# Patient Record
Sex: Female | Born: 1960
Health system: Southern US, Community
[De-identification: ages and names within clinical notes are randomized; demographics above are authoritative.]

## PROBLEM LIST (undated history)

## (undated) DIAGNOSIS — M1990 Unspecified osteoarthritis, unspecified site: Secondary | ICD-10-CM

## (undated) DIAGNOSIS — D649 Anemia, unspecified: Secondary | ICD-10-CM

## (undated) DIAGNOSIS — F32A Depression, unspecified: Secondary | ICD-10-CM

## (undated) DIAGNOSIS — K219 Gastro-esophageal reflux disease without esophagitis: Secondary | ICD-10-CM

## (undated) DIAGNOSIS — Z90710 Acquired absence of both cervix and uterus: Secondary | ICD-10-CM

## (undated) DIAGNOSIS — C539 Malignant neoplasm of cervix uteri, unspecified: Secondary | ICD-10-CM

## (undated) DIAGNOSIS — M810 Age-related osteoporosis without current pathological fracture: Secondary | ICD-10-CM

## (undated) DIAGNOSIS — G43909 Migraine, unspecified, not intractable, without status migrainosus: Secondary | ICD-10-CM

## (undated) DIAGNOSIS — J449 Chronic obstructive pulmonary disease, unspecified: Secondary | ICD-10-CM

## (undated) DIAGNOSIS — F419 Anxiety disorder, unspecified: Secondary | ICD-10-CM

## (undated) DIAGNOSIS — E7800 Pure hypercholesterolemia, unspecified: Secondary | ICD-10-CM

## (undated) DIAGNOSIS — E0590 Thyrotoxicosis, unspecified without thyrotoxic crisis or storm: Secondary | ICD-10-CM

## (undated) DIAGNOSIS — E2839 Other primary ovarian failure: Secondary | ICD-10-CM

## (undated) DIAGNOSIS — F329 Major depressive disorder, single episode, unspecified: Secondary | ICD-10-CM

## (undated) HISTORY — DX: Chronic obstructive pulmonary disease, unspecified: J44.9

## (undated) HISTORY — PX: TONSILLECTOMY: SUR1361

## (undated) HISTORY — DX: Anxiety disorder, unspecified: F41.9

## (undated) HISTORY — DX: Other primary ovarian failure: E28.39

## (undated) HISTORY — DX: Gastro-esophageal reflux disease without esophagitis: K21.9

## (undated) HISTORY — DX: Unspecified osteoarthritis, unspecified site: M19.90

## (undated) HISTORY — DX: Age-related osteoporosis without current pathological fracture: M81.0

## (undated) HISTORY — DX: Acquired absence of both cervix and uterus: Z90.710

## (undated) HISTORY — PX: BREAST SURGERY: SHX581

## (undated) HISTORY — PX: ABDOMINAL HYSTERECTOMY: SHX81

## (undated) HISTORY — DX: Migraine, unspecified, not intractable, without status migrainosus: G43.909

## (undated) HISTORY — DX: Pure hypercholesterolemia, unspecified: E78.00

## (undated) HISTORY — DX: Depression, unspecified: F32.A

## (undated) HISTORY — DX: Malignant neoplasm of cervix uteri, unspecified: C53.9

## (undated) HISTORY — DX: Thyrotoxicosis, unspecified without thyrotoxic crisis or storm: E05.90

---
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
## 2000-08-13 ENCOUNTER — Ambulatory Visit (HOSPITAL_COMMUNITY): Admission: EM | Admit: 2000-08-13 | Discharge: 2000-08-13 | Payer: Self-pay | Admitting: Emergency Medicine

## 2000-08-13 ENCOUNTER — Encounter: Payer: Self-pay | Admitting: Oral Surgery

## 2006-02-02 ENCOUNTER — Ambulatory Visit (HOSPITAL_COMMUNITY): Admission: RE | Admit: 2006-02-02 | Discharge: 2006-02-02 | Payer: Self-pay | Admitting: General Surgery

## 2006-08-17 ENCOUNTER — Ambulatory Visit (HOSPITAL_COMMUNITY): Admission: RE | Admit: 2006-08-17 | Discharge: 2006-08-17 | Payer: Self-pay | Admitting: General Surgery

## 2007-02-15 ENCOUNTER — Ambulatory Visit (HOSPITAL_COMMUNITY): Admission: RE | Admit: 2007-02-15 | Discharge: 2007-02-15 | Payer: Self-pay | Admitting: Family Medicine

## 2007-04-06 HISTORY — PX: COLONOSCOPY: SHX174

## 2017-01-27 ENCOUNTER — Ambulatory Visit (INDEPENDENT_AMBULATORY_CARE_PROVIDER_SITE_OTHER): Payer: No Typology Code available for payment source | Admitting: Obstetrics and Gynecology

## 2017-01-27 ENCOUNTER — Encounter: Payer: Self-pay | Admitting: Obstetrics and Gynecology

## 2017-01-27 VITALS — BP 110/80 | HR 84 | Ht 65.0 in | Wt 131.8 lb

## 2017-01-27 DIAGNOSIS — N930 Postcoital and contact bleeding: Secondary | ICD-10-CM

## 2017-01-27 DIAGNOSIS — Z01411 Encounter for gynecological examination (general) (routine) with abnormal findings: Secondary | ICD-10-CM | POA: Diagnosis not present

## 2017-01-27 DIAGNOSIS — N950 Postmenopausal bleeding: Secondary | ICD-10-CM

## 2017-01-27 MED ORDER — ESTRADIOL 1 MG PO TABS: 1.0000 mg | ORAL_TABLET | Freq: Every day | ORAL | 11 refills | Status: DC

## 2017-01-27 NOTE — Progress Notes (Signed)
Assessment:  Annual Gyn Exam  Postcoital bleeding status post hysterectomy Postmenopausal status Plan:  1. pap smear done, next pap due 3 years 2. Estrace Rx 3. return in 1 year for recheck of hormone therapy or prn 4    Annual mammogram advised Subjective:  Nicole Ewing is a 56 y.o. female No obstetric history on file. who presents for annual exam. No LMP recorded. The patient has complaints today of post-coital bleeding, dyspareunia, and stress incontinence. Pt notes that she has had a hysterectomy. Pt denies being on hormone therapy and notes that she no longer has menstrual cycles. Pt denies any other symptoms. Pt states that she has had fibrocystic tissue removed from right breast.  The following portions of the patient's history were reviewed and updated as appropriate: allergies, current medications, past family history, past medical history, past social history, past surgical history and problem list.  Family history notable for a husband's brother died today of Ewing Gary's disease, the patient's younger baby brother died of overdose 1 day after husband's sister died of a drug overdose, allegedly from same supplier Past Medical History:  Diagnosis Date  . Acid reflux   . Anxiety     Past Surgical History:  Procedure Laterality Date  . ABDOMINAL HYSTERECTOMY    . TONSILLECTOMY       Current Outpatient Prescriptions:  .  cholecalciferol (VITAMIN D) 400 units TABS tablet, Take 400 Units by mouth., Disp: , Rfl:  .  diazepam (VALIUM) 5 MG tablet, Take 5 mg by mouth every 6 (six) hours as needed for anxiety., Disp: , Rfl:  .  glucosamine-chondroitin 500-400 MG tablet, Take 1 tablet by mouth 3 (three) times daily., Disp: , Rfl:  .  omeprazole (PRILOSEC) 20 MG capsule, Take 20 mg by mouth daily., Disp: , Rfl:  .  vitamin B-12 (CYANOCOBALAMIN) 250 MCG tablet, Take 250 mcg by mouth daily., Disp: , Rfl:   Review of Systems Constitutional: negative Gastrointestinal:  negative Genitourinary: post-coital bleeding, dyspareunia, and stress incontinence  Objective:  BP 110/80   Pulse 84   Ht 5\' 5"  (1.651 m)   Wt 131 lb 12.8 oz (59.8 kg)   BMI 21.93 kg/m    BMI: Body mass index is 21.93 kg/m.  General Appearance: Alert, appropriate appearance for age. No acute distress HEENT: Grossly normal Neck / Thyroid:  Cardiovascular: RRR; normal S1, S2, no murmur Lungs: CTA bilaterally Back: No CVAT Breast Exam: No dimpling, nipple retraction or discharge. No masses or nodes., Normal to inspection and No masses or nodes.No dimpling, nipple retraction or discharge. Gastrointestinal: Soft, non-tender, no masses or organomegaly Pelvic Exam: Vulva and vagina appear normal. Bimanual exam reveals normal uterus and adnexa. Vaginal: normal without tenderness, induration or masses, normal rugae, atrophic mucosa and well supported Cervix: removed surgically Adnexa: normal bimanual exam Uterus: absent Rectal: good sphincter tone, no masses and guaiac negative Rectovaginal: not indicated Lymphatic Exam: Non-palpable nodes in neck, clavicular, axillary, or inguinal regions  Skin: no rash or abnormalities Neurologic: Normal gait and speech, no tremor  Psychiatric: Alert and oriented, appropriate affect.  Urinalysis:Not done  Nicole Ewing. MD Pgr [phone] 3:47 PM  By signing my name below, I, Nicole Ewing, attest that this documentation has been prepared under the direction and in the presence of Jonnie Kind, MD. Electronically Signed: Soijett Ewing, ED Scribe. 01/27/17. 3:47 PM.  I personally performed the services described in this documentation, which was SCRIBED in my presence. The recorded information has been reviewed and considered  accurate. It has been edited as necessary during review. Jonnie Kind, MD

## 2017-06-23 ENCOUNTER — Ambulatory Visit (INDEPENDENT_AMBULATORY_CARE_PROVIDER_SITE_OTHER): Admitting: Orthopaedic Surgery

## 2017-06-23 ENCOUNTER — Encounter (INDEPENDENT_AMBULATORY_CARE_PROVIDER_SITE_OTHER): Payer: Self-pay | Admitting: Orthopaedic Surgery

## 2017-06-23 VITALS — BP 137/90 | HR 98 | Ht 65.0 in | Wt 136.0 lb

## 2017-06-23 DIAGNOSIS — M25511 Pain in right shoulder: Secondary | ICD-10-CM

## 2017-06-23 DIAGNOSIS — M7541 Impingement syndrome of right shoulder: Secondary | ICD-10-CM | POA: Diagnosis not present

## 2017-06-23 NOTE — Progress Notes (Signed)
Office Visit Note/orthopedic consultation for on-the-job injury right shoulder   Patient: Nicole Ewing           Date of Birth: 08-16-1961           MRN: [id number] Visit Date: 06/23/2017              Requested by: Rory Percy, MD [place], [place] [address] PCP: Rory Percy, MD   Assessment & Plan: Visit Diagnoses:  1. Impingement syndrome of right shoulder     Plan: we will seek approval for right shoulder MRI scan to rule out partial or small full thickness tear as the result of her injury.  Plan to see her back after MRI of right shoulder no contrast needed.   Follow-Up Instructions: No Follow-up on file.   Orders:  Orders Placed This Encounter  Procedures  . MR SHOULDER RIGHT WO CONTRAST   No orders of the defined types were placed in this encounter.     Procedures: No procedures performed   Clinical Data: No additional findings.   Subjective: Chief Complaint  Patient presents with  . 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 She was seen at [facility] on the following day. She's been on home exercise program prednisone pack had a prednisone injection. She's had progressive decreased range of motion and pain difficult with outstretched reaching overhead activities. She denies numbness or tingling finger is or tingling in her fingers. She is right hand  Dominant, no past history of injured her shoulder she has no pain in her left shoulder. 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 Patient was not provided with a CA-17 form. Office notes from [place] were reviewed at this visit.She had an injection in her shoulder with continued symptoms.  Thank for the  opportunity to see her in consultation. I will keep you updated of her progress.  Review of Systems 14 point review systems performed positive for acid reflux mild anxiety history of migraines. She had previous hysterectomy tubal pregnancy and tonsillectomy when she was in fifth grade. 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   Objective: Vital Signs: BP 137/90   Pulse 98   Ht 5\' 5"  (1.651 m)   Wt 136 lb (61.7 kg)   BMI 22.63 kg/m   Physical Exam  Constitutional: She is oriented to person, place, and time. She appears well-developed.  HENT:  Head: Normocephalic.  Right Ear: External ear normal.  Left Ear: External ear normal.  Eyes: Pupils are equal, round, and reactive to light.  Neck: No tracheal deviation present. No thyromegaly present.  Cardiovascular: Normal rate.   Pulmonary/Chest: Effort normal.  Abdominal: Soft.  Musculoskeletal:  Good shoulder range of motion. Negative her meet negative Spurling no brachioplexus tenderness. Tenderness in the supraspinous fossa. Long head of the biceps moderately tender. Actively she can flex only to 80 abduct to 71. With isolated supraspinatus testing she has pain and some weakness. No weakness of external rotation. She has mild deltoid atrophy on the right shoulder which is her dominant shoulder compared to the left. Left shoulder shows full range of  motion negative impingement. Reflexes are normal normal heel toe gait no lower extremity clonus no hyperreflexia.  Neurological: She is alert and oriented to person, place, and time.  Skin: Skin is warm and dry.  Psychiatric: She has a normal mood and affect. Her behavior is normal.    Ortho Exam  Specialty Comments:  No specialty comments available.  Imaging: Outside images of her shoulder viewed this is negative for fracture. No significant AC joint degenerative changes. Glenohumeral joint is of well located and no high riding of the head.No  glenohumeral arthritis.  These images were from [facility]   PMFS History: There are no active problems to display for this patient.  Past Medical History:  Diagnosis Date  . Acid reflux   . Anxiety   . H/O: hysterectomy    Cervical dysplasia  . Menopause ovarian failure   . Migraines     Family History  Problem Relation Age of Onset  . Melanoma Father   . Graves' disease Child     Past Surgical History:  Procedure Laterality Date  . ABDOMINAL HYSTERECTOMY    . TONSILLECTOMY     Social History   Occupational History  . Not on file.   Social History Main Topics  . Smoking status: Former Research scientist (life sciences)  . Smokeless tobacco: Former Systems developer  . Alcohol use Yes     Comment: occasional  . Drug use: No  . Sexual activity: Not on file

## 2017-07-30 ENCOUNTER — Ambulatory Visit
Admission: RE | Admit: 2017-07-30 | Discharge: 2017-07-30 | Disposition: A | Discharge status: Home / Self Care | Payer: Worker's Compensation | Source: Ambulatory Visit | Attending: Orthopaedic Surgery | Admitting: Orthopaedic Surgery

## 2017-07-30 DIAGNOSIS — M7541 Impingement syndrome of right shoulder: Secondary | ICD-10-CM

## 2017-08-09 ENCOUNTER — Other Ambulatory Visit: Payer: Self-pay

## 2017-08-11 ENCOUNTER — Ambulatory Visit (INDEPENDENT_AMBULATORY_CARE_PROVIDER_SITE_OTHER): Admitting: Orthopaedic Surgery

## 2017-08-11 VITALS — Ht 65.0 in | Wt 136.0 lb

## 2017-08-11 DIAGNOSIS — M7541 Impingement syndrome of right shoulder: Secondary | ICD-10-CM | POA: Diagnosis not present

## 2017-08-11 MED ORDER — METHYLPREDNISOLONE ACETATE 40 MG/ML IJ SUSP
40.0000 mg | INTRAMUSCULAR | Status: AC | PRN
  Administered 2017-08-11: 40 mg via INTRA_ARTICULAR

## 2017-08-11 MED ORDER — BUPIVACAINE HCL 0.25 % IJ SOLN
4.0000 mL | INTRAMUSCULAR | Status: AC | PRN
  Administered 2017-08-11: 4 mL via INTRA_ARTICULAR

## 2017-08-11 MED ORDER — LIDOCAINE HCL 1 % IJ SOLN
1.0000 mL | INTRAMUSCULAR | Status: AC | PRN
  Administered 2017-08-11: 1 mL

## 2017-08-11 NOTE — Progress Notes (Signed)
Office Visit Note   Patient: Nicole Ewing           Date of Birth: 01-10-1961           MRN: [id number] Visit Date: 08/11/2017              Requested by: Rory Percy, MD [place], [place] [address] PCP: Rory Percy, MD   Assessment & Plan: Visit Diagnoses:  1. 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  Follow-Up Instructions: No Follow-up on file.   Orders:  No orders of the defined types were placed in this encounter.  No orders of the defined types were placed in this encounter.     Procedures: Large Joint Inj Date/Time: 08/11/2017 3:19 PM Performed by: Marybelle Killings Authorized by: Marybelle Killings   Consent Given by:  Patient Indications:  Pain Location:  Shoulder Site:  R subacromial bursa Needle Size:  22 G Needle Length:  1.5 inches Ultrasound Guidance: No   Fluoroscopic Guidance: No   Arthrogram: No   Medications:  1 mL lidocaine 1 %; 40 mg methylPREDNISolone acetate 40 MG/ML; 4 mL bupivacaine 0.25 % Aspiration Attempted: No   Patient tolerance:  Patient tolerated the procedure well with no immediate complications     Clinical Data: No additional findings.   Subjective: Chief Complaint  Patient presents with  . Right Shoulder - Pain, Follow-up    HPI 6 show female returns for runoff follow-up for continued problems with right shoulder after an on-the-job injury that occurred on 06/01/2017. She's been signed a rehabilitation nurse Elizebeth Brooking RN, CCM. Fax number is [phone]  Review of Systems 14 point  review of systems is updated from 06/23/2017 is unchanged from a last VISIT other than as mentioned in history of present illness.   Objective: Vital Signs: Ht 5\' 5"  (1.651 m)   Wt 136 lb (61.7 kg)   BMI 22.63 kg/m   Physical Exam  Constitutional: She is oriented to person,  place, and time. She appears well-developed.  HENT:  Head: Normocephalic.  Right Ear: External ear normal.  Left Ear: External ear normal.  Eyes: Pupils are equal, round, and reactive to light.  Neck: No tracheal deviation present. No thyromegaly present.  Cardiovascular: Normal rate.   Pulmonary/Chest: Effort normal.  Abdominal: Soft.  Neurological: She is alert and oriented to person, place, and time.  Skin: Skin is warm and dry.  Psychiatric: She has a normal mood and affect. Her behavior is normal.    Ortho Exam patient has mild brachial plexus tenderness on the right none on the left. Limitation of flexion she can flex about 100 which is 20 better than last office visit. She has some pain with resisted supraspinatus testing but has negative drop arm test. Acromioclavicular joint is minimally tender long head biceps tendon is normal well located and the groove negative Yergason test-320 extension M semi-reflexes are 2+ and symmetrical. Normal heel toe gait. Opposite shoulder shows full range of motion both shoulders are stable negative subluxation.  Specialty Comments:  No specialty comments available.  Imaging:  CLINICAL DATA:  Right shoulder pain and limited range of motion after reaching injury 06/01/2017. No previous relevant surgery.  EXAM: MRI OF THE RIGHT SHOULDER WITHOUT CONTRAST  TECHNIQUE: Multiplanar, multisequence MR imaging of the shoulder was performed. No  intravenous contrast was administered.  COMPARISON:  Radiographs 06/02/2017.  FINDINGS: Rotator cuff:  Intact without significant tendinosis.  Muscles:  No focal muscular atrophy or edema.  Biceps long head:  Intact and normally positioned.  Acromioclavicular Joint: The acromion is type 1. There are mild acromioclavicular degenerative changes. There is trace fluid in the subacromial -subdeltoid bursa.  Glenohumeral Joint: No significant shoulder joint effusion or glenohumeral  arthropathy.  Labrum: No evidence of labral tear. Probable congenital variant of the anterior superior labrum.  Bones: No acute or significant extra-articular osseous findings.  Other: No significant soft tissue findings.  IMPRESSION: 1. No acute findings or evidence of internal derangement. The rotator cuff appears normal. 2. Minimal subacromial -subdeltoid bursitis. 3. Mild acromioclavicular degenerative changes.   Electronically Signed   By: Richardean Sale M.D.   On: 07/31/2017 10:00  PMFS History: There are no active problems to display for this patient.  Past Medical History:  Diagnosis Date  . Acid reflux   . Anxiety   . H/O: hysterectomy    Cervical dysplasia  . Menopause ovarian failure   . Migraines     Family History  Problem Relation Age of Onset  . Melanoma Father   . Graves' disease Child     Past Surgical History:  Procedure Laterality Date  . ABDOMINAL HYSTERECTOMY    . TONSILLECTOMY     Social History   Occupational History  . Not on file.   Social History Main Topics  . Smoking status: Former Research scientist (life sciences)  . Smokeless tobacco: Former Systems developer  . Alcohol use Yes     Comment: occasional  . Drug use: No  . Sexual activity: Not on file

## 2017-09-22 ENCOUNTER — Ambulatory Visit (INDEPENDENT_AMBULATORY_CARE_PROVIDER_SITE_OTHER): Admitting: Orthopaedic Surgery

## 2017-09-22 ENCOUNTER — Encounter (INDEPENDENT_AMBULATORY_CARE_PROVIDER_SITE_OTHER): Payer: Self-pay | Admitting: Orthopaedic Surgery

## 2017-09-22 ENCOUNTER — Ambulatory Visit (INDEPENDENT_AMBULATORY_CARE_PROVIDER_SITE_OTHER)

## 2017-09-22 VITALS — BP 129/92 | HR 89 | Ht 65.0 in | Wt 136.0 lb

## 2017-09-22 DIAGNOSIS — M19011 Primary osteoarthritis, right shoulder: Secondary | ICD-10-CM | POA: Diagnosis not present

## 2017-09-22 DIAGNOSIS — M47812 Spondylosis without myelopathy or radiculopathy, cervical region: Secondary | ICD-10-CM | POA: Diagnosis not present

## 2017-09-22 NOTE — Progress Notes (Addendum)
Office Visit Note   Patient: Nicole Ewing           Date of Birth: [birth year]           MRN: [id number] Visit Date: 09/22/2017              Requested by: Rory Percy, MD [place], [place] [address] PCP: Rory Percy, MD   Assessment & Plan: Visit Diagnoses:  1. Arthritis of right acromioclavicular joint   2. Spondylosis without myelopathy or radiculopathy, cervical region     Plan: MRI scan is reviewed with patient as well as her report. She does have some acromioclavicular arthritis and we performed an injection today but she DID NOT get any relief and still has pain with shoulder range of motion in cervical range of motion.From on-the-job injury in June with the findings on her exam I would recommend proceeding with cervical MRI to rule out a disc herniation on the right side that may have  occurred at the time of her injury which is causing her persistent pain symptoms. Her plain radiographs do show spondylosis with disc space narrowing and spurring primarily at C5-6 and it may be that this is not an acute injury and may be related to pre-existing problems with MRI would determine that. Work slip given for continue modified work pending return and review of cervical MRI scan.  Follow-Up Instructions: No Follow-up on file.   Orders:  No orders of the defined types were placed in this encounter.  No orders of the defined types were placed in this encounter.     Procedures: No procedures performed   Clinical Data: No additional findings.   Subjective: Chief Complaint  Patient presents with  . Right Shoulder - Follow-up, Pain    HPI 56 yo female returns for on-the-job injury that occurred on 06/01/2017. She is here with the rehabilitation nurse Elizebeth Brooking RN, CCM fax number [phone]. Patient states she's been on modified duty with limited lifting. She continues to have pain in her shoulder she's had 2 subacromial injections one done by me. MRI scan showed  intact rotator cuff intact biceps tendon and some acromioclavicular degenerative changes. She states recently she started having more numbness radiates down her arm into her hand. Most of the pain is more proximal above the elbow most of the time. She denies any leg weakness no gait problems no staggering. Pain bothers her on a daily basis. She's had less problems doing the modified work but still has continued symptoms of bothers her at night. She's noticed pain with extension in her neck.She is concerned about the amount of pain that she's having and is concerned about being able to continue her normal job since she's been with [employer] for a approximally 17 years.  Review of Systems review of systems updated unchanged from 08/11/2017 office visit other than as mentioned above.   Objective: Vital Signs: BP (!) 129/92   Pulse 89   Ht 5\' 5"  (1.651 m)   Wt 136 lb (61.7 kg)   BMI 22.63 kg/m   Physical Exam  Constitutional: She is oriented to person, place, and time. She appears well-developed.  HENT:  Head: Normocephalic.  Right Ear: External ear normal.  Left Ear: External ear normal.  Eyes: Pupils are equal, round, and reactive to light.  Neck: No tracheal deviation present. No thyromegaly present.  Cardiovascular: Normal rate.   Pulmonary/Chest: Effort normal.  Abdominal: Soft.  Neurological: She is alert and  oriented to person, place, and time.  Skin: Skin is warm and dry.  Psychiatric: She has a normal mood and affect. Her behavior is normal.    Ortho Exam no supraclavicular lymphadenopathy pain with crossarm adduction test on the right negative on the left. She still has some pain with impingement negative drop arm test. Negative liftoff test no subluxation long head of the biceps is exquisitely tender she has a moderate brachial plexus tenderness positive Spurling on the left increased pain with cervical compression some relief with distraction. Upper extremity reflexes are 2+  biceps 2+ brachioradialis 1+ triceps. She has symmetrical triceps strength no SCU weakness.  Specialty Comments:  No specialty comments available.  Imaging: Study Result   CLINICAL DATA:  Right shoulder pain and limited range of motion after reaching injury 06/01/2017. No previous relevant surgery.  EXAM: MRI OF THE RIGHT SHOULDER WITHOUT CONTRAST  TECHNIQUE: Multiplanar, multisequence MR imaging of the shoulder was performed. No intravenous contrast was administered.  COMPARISON:  Radiographs 06/02/2017.  FINDINGS: Rotator cuff:  Intact without significant tendinosis.  Muscles:  No focal muscular atrophy or edema.  Biceps long head:  Intact and normally positioned.  Acromioclavicular Joint: The acromion is type 1. There are mild acromioclavicular degenerative changes. There is trace fluid in the subacromial -subdeltoid bursa.  Glenohumeral Joint: No significant shoulder joint effusion or glenohumeral arthropathy.  Labrum: No evidence of labral tear. Probable congenital variant of the anterior superior labrum.  Bones: No acute or significant extra-articular osseous findings.  Other: No significant soft tissue findings.  IMPRESSION: 1. No acute findings or evidence of internal derangement. The rotator cuff appears normal. 2. Minimal subacromial -subdeltoid bursitis. 3. Mild acromioclavicular degenerative changes.   Electronically Signed   By: Richardean Sale M.D.   On: 07/31/2017 10:00       PMFS History: There are no active problems to display for this patient.  Past Medical History:  Diagnosis Date  . Acid reflux   . Anxiety   . H/O: hysterectomy    Cervical dysplasia  . Menopause ovarian failure   . Migraines     Family History  Problem Relation Age of Onset  . Melanoma Father   . Graves' disease Child     Past Surgical History:  Procedure Laterality Date  . ABDOMINAL HYSTERECTOMY    . TONSILLECTOMY     Social History    Occupational History  . Not on file.   Social History Main Topics  . Smoking status: Former Research scientist (life sciences)  . Smokeless tobacco: Former Systems developer  . Alcohol use Yes     Comment: occasional  . Drug use: No  . Sexual activity: Not on file

## 2017-10-04 ENCOUNTER — Other Ambulatory Visit (INDEPENDENT_AMBULATORY_CARE_PROVIDER_SITE_OTHER): Payer: Self-pay | Admitting: Orthopaedic Surgery

## 2017-10-04 DIAGNOSIS — M4712 Other spondylosis with myelopathy, cervical region: Secondary | ICD-10-CM

## 2017-10-04 DIAGNOSIS — M4722 Other spondylosis with radiculopathy, cervical region: Principal | ICD-10-CM

## 2017-10-05 ENCOUNTER — Telehealth (INDEPENDENT_AMBULATORY_CARE_PROVIDER_SITE_OTHER): Payer: Self-pay | Admitting: Radiology

## 2017-10-05 NOTE — Telephone Encounter (Signed)
Patient left voicemail stating that [place] would not approve the MRI of her C-spine because there is nothing in the note that states she had shoulder pain that radiated into her neck. She states that this was discussed at the first visit.  She requests a call back.    Please advise.    [phone]

## 2017-10-06 NOTE — Telephone Encounter (Signed)
noted 

## 2017-10-06 NOTE — Telephone Encounter (Signed)
Once she has written denial she can appeal and get under PI. ROV after scan

## 2017-10-13 ENCOUNTER — Telehealth (INDEPENDENT_AMBULATORY_CARE_PROVIDER_SITE_OTHER): Payer: Self-pay | Admitting: Radiology

## 2017-10-13 NOTE — Telephone Encounter (Signed)
Do you have an updated status on MRI? It looked like you faxed more information on 10/11/2017 possibly?

## 2017-10-13 NOTE — Telephone Encounter (Signed)
Patient called Nicole Ewing office and left message to check the status of her MRI.

## 2017-10-13 NOTE — Telephone Encounter (Signed)
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 She says she has a paper that she received from [employer] and she will fax it to me for me to look at regarding her dx

## 2017-11-01 ENCOUNTER — Telehealth (INDEPENDENT_AMBULATORY_CARE_PROVIDER_SITE_OTHER): Payer: Self-pay | Admitting: Orthopaedic Surgery

## 2017-11-01 ENCOUNTER — Telehealth (INDEPENDENT_AMBULATORY_CARE_PROVIDER_SITE_OTHER): Payer: Self-pay

## 2017-11-01 NOTE — Telephone Encounter (Signed)
Patient returned call asked for a call back. The number to contact patient is [phone]

## 2017-11-01 NOTE — Telephone Encounter (Signed)
For followup on MRI, see other message, thanks

## 2017-11-01 NOTE — Telephone Encounter (Signed)
There was a voicemail on your phone from patient in reference to an MRI scan of her neck that she was supposed to be scheduled for. She said on VM that her insurance is needing more information from Dr Lorin Mercy to get scan approved. She asked for a call back to discuss. I tried calling her to further discuss, she did not answer. LM for her.

## 2017-11-01 NOTE — Telephone Encounter (Signed)
Nicole Ewing has been working on this and states claim accepted her claim for her shoulder and if its about her cervical spine then yes more information has been faxed to her employer per Nicole Ewing. She is still waiting on their approval

## 2017-11-01 NOTE — Telephone Encounter (Signed)
Sabrina can you follow up.

## 2017-11-07 ENCOUNTER — Telehealth (INDEPENDENT_AMBULATORY_CARE_PROVIDER_SITE_OTHER): Payer: Self-pay | Admitting: Orthopaedic Surgery

## 2017-11-07 NOTE — Telephone Encounter (Signed)
Send the last OV which has the needed info in the note  . You can read it to pt as well under PLAN . thanks

## 2017-11-07 NOTE — Telephone Encounter (Signed)
Nicole Ewing,  Patient called and stated that she needs to speak with you in regards to the W/C papers she gave to you.  Please call patient.

## 2017-11-07 NOTE — Telephone Encounter (Signed)
Patient has letter that was sent from Dept of Labor that needs to be filled out. She states that she has spoken with her union and she has not been denied for MRI at this point so she cannot appeal.  The Dept of Labor needs to know how the neck problem is related to her injury and why she needs the MRI of her cervical spine.  She states that they say they have not denied the MRI, just request more information.   Please advise on whether you have paperwork or what you would like for me to do. Thanks.

## 2017-11-08 NOTE — Telephone Encounter (Signed)
auth request is in process per dept of labor automated system

## 2017-11-08 NOTE — Telephone Encounter (Signed)
Can you send this to her WC person?

## 2017-11-18 ENCOUNTER — Telehealth (INDEPENDENT_AMBULATORY_CARE_PROVIDER_SITE_OTHER): Payer: Self-pay | Admitting: Radiology

## 2017-11-18 NOTE — Telephone Encounter (Signed)
Can you tell me if there has been any decision on approval for MRI by [employer]? Thanks.

## 2017-11-18 NOTE — Telephone Encounter (Signed)
Patient left voicemail asking if there has been any news on the scheduling of her MRI and if it has been approved by [employer].  She requests return call. [phone]

## 2017-11-23 ENCOUNTER — Telehealth (INDEPENDENT_AMBULATORY_CARE_PROVIDER_SITE_OTHER): Payer: Self-pay | Admitting: Orthopaedic Surgery

## 2017-11-23 NOTE — Telephone Encounter (Signed)
Patient returned call asked for a call back 

## 2017-11-23 NOTE — Telephone Encounter (Signed)
I left patient a voicemail. I have not heard anything further in regards to a denial or approval for MRI at this point. Office notes were sent to [facility] that go into detail in regards to workplace injury.

## 2017-11-24 NOTE — Telephone Encounter (Signed)
Patient came in for PT in [place] office. She has paperwork from the Department of Labor. She states that they will not accept office note and need documentation regarding why we need cervical MRI and how that relates to her injury. She states that the diagnosis for MRI should not match shoulder impingement. I told patient I would try and get with Amy Bishop tomorrow to see what we else we are able to do to get scan approved.

## 2017-11-30 ENCOUNTER — Telehealth (INDEPENDENT_AMBULATORY_CARE_PROVIDER_SITE_OTHER): Payer: Self-pay | Admitting: Orthopaedic Surgery

## 2017-11-30 NOTE — Telephone Encounter (Signed)
Patient called wanting to speak with you about possibly needing an MRI. CB # [phone]

## 2017-12-01 NOTE — Telephone Encounter (Signed)
Duplicate message in chart.  

## 2017-12-01 NOTE — Telephone Encounter (Signed)
I spoke with patient. She states that she spoke with the Dept of Labor.  They are saying that the office note will not work to get MRI approved. She needs, in a note, that the neck could be the source of her problems. The Dept of Labor needs a different code and needs to know how the shoulder and neck are related.   Can I send a note relaying all of the information in your plan from last visit, but make it separate from the office note?  They will not deny or approve without it. They also need cervical diagnosis code.   Please advise.

## 2017-12-04 NOTE — Telephone Encounter (Signed)
Ok proceed thanks  ,I  Called pt and discussed  Therapy making her worse so I told her to put therapy on hold pending MRI review thanks

## 2017-12-05 NOTE — Telephone Encounter (Signed)
Noted, MRI pending scheduling.

## 2018-01-12 ENCOUNTER — Telehealth (INDEPENDENT_AMBULATORY_CARE_PROVIDER_SITE_OTHER): Payer: Self-pay | Admitting: Radiology

## 2018-01-12 NOTE — Telephone Encounter (Signed)
Patient faxed over new CA-17 form that had to be completed. Form was completed with same modified duty pending MRI.  I called and spoke with the patient in depth in regards to form.  It is available to pick up in the [place] office.

## 2018-01-13 ENCOUNTER — Telehealth (INDEPENDENT_AMBULATORY_CARE_PROVIDER_SITE_OTHER): Payer: Self-pay

## 2018-01-13 NOTE — Telephone Encounter (Signed)
Received voice mail from pt (transferred the call to you as well) stating form that was completed yesterday in the [place] office did not have a date on it and she needs this to be done and faxed to [phone]

## 2018-01-16 ENCOUNTER — Ambulatory Visit (HOSPITAL_COMMUNITY)
Admission: RE | Admit: 2018-01-16 | Discharge: 2018-01-16 | Disposition: A | Discharge status: Home / Self Care | Payer: Self-pay | Source: Ambulatory Visit | Attending: Orthopaedic Surgery | Admitting: Orthopaedic Surgery

## 2018-01-16 DIAGNOSIS — M50221 Other cervical disc displacement at C4-C5 level: Secondary | ICD-10-CM | POA: Insufficient documentation

## 2018-01-16 DIAGNOSIS — M4712 Other spondylosis with myelopathy, cervical region: Secondary | ICD-10-CM

## 2018-01-16 DIAGNOSIS — M4722 Other spondylosis with radiculopathy, cervical region: Secondary | ICD-10-CM | POA: Insufficient documentation

## 2018-01-16 NOTE — Telephone Encounter (Signed)
I left voicemail for patient requesting she take form back by [facility] office and Baldo Ash will date and initial for her or if the company would like to fax to me in [place] office at [phone] I can do that and then fax back. Dr. Lorin Mercy is not in the office this week and had the copy of the completed form.

## 2018-01-26 ENCOUNTER — Ambulatory Visit (INDEPENDENT_AMBULATORY_CARE_PROVIDER_SITE_OTHER): Admitting: Orthopaedic Surgery

## 2018-01-26 ENCOUNTER — Encounter (INDEPENDENT_AMBULATORY_CARE_PROVIDER_SITE_OTHER): Payer: Self-pay | Admitting: Orthopaedic Surgery

## 2018-01-26 DIAGNOSIS — M25511 Pain in right shoulder: Secondary | ICD-10-CM | POA: Diagnosis not present

## 2018-01-26 DIAGNOSIS — M7541 Impingement syndrome of right shoulder: Secondary | ICD-10-CM | POA: Diagnosis not present

## 2018-01-26 MED ORDER — DULOXETINE HCL 20 MG PO CPEP
20.0000 mg | ORAL_CAPSULE | Freq: Every day | ORAL | 0 refills | Status: DC
Start: 2018-01-26 — End: 2019-06-05

## 2018-01-26 NOTE — Progress Notes (Signed)
Office Visit Note   Patient: Nicole Ewing           Date of Birth: 30-Dec-1960           MRN: [id number] Visit Date: 01/26/2018              Requested by: Rory Percy, MD [place], [place] [address] PCP: Rory Percy, MD   Assessment & Plan: Visit Diagnoses:  1. Right shoulder pain, unspecified chronicity     Plan: Reviewed the MRI scan with the patient.  She does not have any significant compression on the right side that would be consistent with her symptoms.  She is on modified work, I discussed with her at this point I do not think surgery is indicated for her shoulder or her cervical spine.  Prescription given for Cymbalta 20 mg p.o. daily to see if this helps some of her pain symptoms.  I plan to recheck her again in 3 weeks for final visit.  I do not think additional tests are indicated.  She could seek another opinion if she would like.  Follow-Up Instructions: No Follow-up on file.   Orders:  No orders of the defined types were placed in this encounter.  No orders of the defined types were placed in this encounter.     Procedures: No procedures performed   Clinical Data: No additional findings.   Subjective: Chief Complaint  Patient presents with  . Neck - Follow-up    MRI review  . Right Shoulder - Follow-up    HPI 57 year old female returns for follow-up for on-the-job injury working for the [place] [employer].  Injury date 06/01/2017 when she reached up to grab a box overhead and felt a sudden pop with sharp pain in her shoulder and decreased range of motion.  She is been through physical therapy, prednisone, medications, injections subacromial, acromioclavicular joint.  MRI scan of her shoulder demonstrated no significant pathology.  No rotator cuff muscle atrophy or injury.  She had some mild acromioclavicular degenerative changes.  Due to her persistent symptoms MRI scan of the cervical spine was obtained.  Available for review today.  Review of  Systems point review of systems is updated and is unchanged from 06/23/2017 office visit.   Objective: Vital Signs: There were no vitals taken for this visit.  Physical Exam  Constitutional: She is oriented to person, place, and time. She appears well-developed.  HENT:  Head: Normocephalic.  Right Ear: External ear normal.  Left Ear: External ear normal.  Eyes: Pupils are equal, round, and reactive to light.  Neck: No tracheal deviation present. No thyromegaly present.  Cardiovascular: Normal rate.  Pulmonary/Chest: Effort normal.  Abdominal: Soft.  Neurological: She is alert and oriented to person, place, and time.  Skin: Skin is warm and dry.  Psychiatric: She has a normal mood and affect. Her behavior is normal.    Ortho Exam patient has 80% cervical rotation.  She still has tenderness over her right shoulder with palpation reflexes are intact normal heel toe gait.  Negative or meet.  Negative Sp[place].  Specialty Comments:  No specialty comments available.  Imaging: CLINICAL DATA:  Neck pain radiating into the right arm for 6 months. No known injury.  EXAM: MRI CERVICAL SPINE WITHOUT CONTRAST  TECHNIQUE: Multiplanar, multisequence MR imaging of the cervical spine was performed. No intravenous contrast was administered.  COMPARISON:  None.  FINDINGS: Alignment: Maintained.  Vertebrae: No fracture or worrisome lesion. There is some degenerative  endplate signal change at C6-7.  Cord: Normal signal throughout.  Posterior Fossa, vertebral arteries, paraspinal tissues: Negative.  Disc levels:  C2-3: Minimal disc bulge endplate spur to the left. The central canal and foramina are open.  C3-4: Minimal disc bulge without stenosis.  C4-5: Shallow central protrusion and mild uncovertebral spurring on the right without stenosis.  C5-6: There is some loss of disc space height. Mild posterior bony ridging is present. There is uncovertebral disease, much  worse on the left. Moderate to moderately severe left foraminal narrowing is identified. The central canal and right foramen are open.  C6-7: There is loss of disc space height. Mild posterior bony ridging and uncovertebral disease are seen. The central canal is open. Mild bilateral foraminal narrowing is more notable on the left.  C7-T1: Negative.  IMPRESSION: Uncovertebral spurring on the left at C5-6 causes moderate to moderately severe left foraminal narrowing. The central canal and right foramen are open.  Uncovertebral spurring at C6-7 causes mild bilateral foraminal narrowing, worse on the left.  Small central protrusion and mild uncovertebral disease on the right at C4-5 without stenosis.   Electronically Signed   By: Inge Rise M.D.   On: 01/16/2018 14:32   PMFS History: There are no active problems to display for this patient.  Past Medical History:  Diagnosis Date  . Acid reflux   . Anxiety   . H/O: hysterectomy    Cervical dysplasia  . Menopause ovarian failure   . Migraines     Family History  Problem Relation Age of Onset  . Melanoma Father   . Graves' disease Child     Past Surgical History:  Procedure Laterality Date  . ABDOMINAL HYSTERECTOMY    . TONSILLECTOMY     Social History   Occupational History  . Not on file  Tobacco Use  . Smoking status: Former Research scientist (life sciences)  . Smokeless tobacco: Former Network engineer and Sexual Activity  . Alcohol use: Yes    Comment: occasional  . Drug use: No  . Sexual activity: Not on file

## 2018-01-31 ENCOUNTER — Telehealth (INDEPENDENT_AMBULATORY_CARE_PROVIDER_SITE_OTHER): Payer: Self-pay | Admitting: Orthopaedic Surgery

## 2018-01-31 ENCOUNTER — Encounter (INDEPENDENT_AMBULATORY_CARE_PROVIDER_SITE_OTHER): Payer: Self-pay | Admitting: Orthopaedic Surgery

## 2018-01-31 ENCOUNTER — Telehealth (INDEPENDENT_AMBULATORY_CARE_PROVIDER_SITE_OTHER): Payer: Self-pay | Admitting: *Deleted

## 2018-01-31 NOTE — Telephone Encounter (Signed)
I called and spoke with patient. 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 She would like to come in and show you paperwork before she starts this route (per union recommendations.)  Appt made for tomorrow morning.

## 2018-01-31 NOTE — Telephone Encounter (Signed)
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  Please fax note to Alease Frame, pt says you should have fax number already. Pt is also requesting 2 copies one for her records.

## 2018-01-31 NOTE — Telephone Encounter (Signed)
OK to cover her for note for 3 wks modified from last ov when we started cymbalta.let her know  After that she will have to go back to regular work, get another opinion or get another job. Discussed with her at last ov. thanks

## 2018-01-31 NOTE — Telephone Encounter (Signed)
Please advise on note?  

## 2018-01-31 NOTE — Telephone Encounter (Signed)
Another note in chart.

## 2018-01-31 NOTE — Telephone Encounter (Signed)
Patient called asking for a call back regarding a position she has been offered at her job and the restrictions that Dr. Lorin Mercy has on her work duty. CB # [phone]

## 2018-02-01 ENCOUNTER — Ambulatory Visit (INDEPENDENT_AMBULATORY_CARE_PROVIDER_SITE_OTHER): Admitting: Orthopaedic Surgery

## 2018-02-01 ENCOUNTER — Telehealth: Payer: Self-pay | Admitting: Radiology

## 2018-02-01 ENCOUNTER — Encounter (INDEPENDENT_AMBULATORY_CARE_PROVIDER_SITE_OTHER): Payer: Self-pay | Admitting: Orthopaedic Surgery

## 2018-02-01 VITALS — BP 125/83 | HR 68 | Ht 65.0 in | Wt 136.0 lb

## 2018-02-01 DIAGNOSIS — M25511 Pain in right shoulder: Secondary | ICD-10-CM | POA: Diagnosis not present

## 2018-02-01 DIAGNOSIS — M7541 Impingement syndrome of right shoulder: Secondary | ICD-10-CM

## 2018-02-01 NOTE — Telephone Encounter (Signed)
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 She remains on modified duty as before until I see her back. thanks

## 2018-02-01 NOTE — Telephone Encounter (Signed)
Patient called back. She states that she has to have note that says she cannot reach out to put mail in [place]. Her supervisor, Awanda Mink, states the note has to say this or she will go back on the route. Patient needs this note as soon as possible. Please fax note to Attn: Alease Frame to number previously listed in chart.  OK to add to modified duty note? Please advise.

## 2018-02-01 NOTE — Progress Notes (Addendum)
Office Visit Note   Patient: Nicole Ewing           Date of Birth: 08-25-1961           MRN: [id number] Visit Date: 02/01/2018              Requested by: Rory Percy, MD [place], [place] [address] PCP: Rory Percy, MD   Assessment & Plan: Visit Diagnoses:  1. Right shoulder pain, unspecified chronicity     Plan: I started the patient on Cymbalta with a 3-week trial to see if this gave her enough improvement that she can get back to doing her regular job.  She has not been on medication long enough and work slip given for continued modified work for 3 weeks from the 01/26/2018 date when I last saw her and filled out her CA-17 form.  Follow-Up Instructions: Follow-up as scheduled.  Orders:  No orders of the defined types were placed in this encounter.  No orders of the defined types were placed in this encounter.     Procedures: No procedures performed   Clinical Data: No additional findings.   Subjective: Chief Complaint  Patient presents with  . Right Shoulder - Pain  . Neck - Pain    HPI patient returns and brought with her a sheet after I had seen her last on 01/26/2018 and gave her a note that said continue modified work  written on her CA -17 form.  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  Review of Systems Updated and unchanged from last office visit.  Objective: Vital Signs: BP 125/83   Pulse 68   Ht 5\' 5"  (1.651 m)   Wt 136 lb (61.7 kg)   BMI 22.63 kg/m   Physical Exam  Constitutional: She is oriented to person, place, and time. She appears well-developed.  HENT:  Head: Normocephalic.  Right Ear: External ear normal.  Left Ear: External ear normal.  Eyes: Pupils are equal, round, and reactive to light.  Neck: No tracheal deviation present. No thyromegaly present.  Cardiovascular: Normal rate.  Pulmonary/Chest: Effort normal.  Abdominal: Soft.    Neurological: She is alert and oriented to person, place, and time.  Skin: Skin is warm and dry.  Psychiatric: She has a normal mood and affect. Her behavior is normal.    Ortho Exam patient is able to reach with her arm up overhead.  She has discomfort with outstretched reaching.  She has 80% cervical rotation.  Still has tenderness over his right shoulder with palpation.  No swelling in the hand.  Normal heel toe gait.   Specialty Comments:  No specialty comments available.  Imaging: No results found.   PMFS History: There are no active problems to display for this patient.  Past Medical History:  Diagnosis Date  . Acid reflux   . Anxiety   . H/O: hysterectomy    Cervical dysplasia  . Menopause ovarian failure   . Migraines     Family History  Problem Relation Age of Onset  . Melanoma Father   . Graves' disease Child     Past Surgical History:  Procedure Laterality Date  . ABDOMINAL HYSTERECTOMY    . TONSILLECTOMY     Social History   Occupational History  . Not on file  Tobacco Use  . Smoking status: Former Research scientist (life sciences)  . Smokeless tobacco: Former Systems developer  Substance and Sexual Activity  . Alcohol use: Yes    Comment: occasional  . Drug use: No  . Sexual activity: Not on file

## 2018-02-02 NOTE — Telephone Encounter (Signed)
Note completed and faxed

## 2018-02-02 NOTE — Telephone Encounter (Signed)
I called patient and advised. 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 She was sent home because at this time, they do not have modified duty. I placed note at front desk in [place] office for patient to pick up.

## 2018-02-16 ENCOUNTER — Ambulatory Visit (INDEPENDENT_AMBULATORY_CARE_PROVIDER_SITE_OTHER): Admitting: Orthopaedic Surgery

## 2018-02-16 ENCOUNTER — Encounter (INDEPENDENT_AMBULATORY_CARE_PROVIDER_SITE_OTHER): Payer: Self-pay | Admitting: Orthopaedic Surgery

## 2018-02-16 VITALS — Ht 65.0 in | Wt 136.0 lb

## 2018-02-16 DIAGNOSIS — M25511 Pain in right shoulder: Secondary | ICD-10-CM | POA: Diagnosis not present

## 2018-02-16 DIAGNOSIS — M7541 Impingement syndrome of right shoulder: Secondary | ICD-10-CM

## 2018-02-16 NOTE — Progress Notes (Signed)
Office Visit Note   Patient: Nicole Ewing           Date of Birth: [birth year]           MRN: [id number] Visit Date: 02/16/2018              Requested by: Rory Percy, MD [place], [place] [address] PCP: Rory Percy, MD   Assessment & Plan: Visit Diagnoses:  1. Right shoulder pain, unspecified chronicity     Plan: She has had appropriate diagnostic workup.  Unfortunately she continues to have symptoms.  I discussed with her that I would recommend stopping the Cymbalta since it only made her sleepy really did not help her pain.  She has been through appropriate conservative treatment had diagnostic injections appropriate diagnostic imaging.  Her options are resume regular work or find some other job someplace else.  Based on findings of her scan no impairment is assigned.  She can return on a as needed basis.  Follow-Up Instructions: No Follow-up on file.   Orders:  No orders of the defined types were placed in this encounter.  No orders of the defined types were placed in this encounter.     Procedures: No procedures performed   Clinical Data: No additional findings.   Subjective: Chief Complaint  Patient presents with  . Neck - Pain, Follow-up  . Right Shoulder - Pain, Follow-up    HPI patient returns states she still having ongoing problems with her shoulder she states she is not able to lift up her grandson.  She has to sit down and have her grandson crawl into her lap.  I discussed with received some paperwork from [employer] mailed in from out of town concerning a modified duty slip that she had signed this was faxed to me as a copy.  Numerous questions were listed.  As I stated in my last note on 02/01/2018 the plan was Cymbalta for 3 weeks and if this was not effective then it would be stopped.  She is been worked up with diagnostic test for both her shoulder and also her neck.  Directions, therapy, anti-inflammatories, muscle relaxants have not been  effective.  Diagnostic studies have failed to find the cause of her continued pain in her shoulder.  Discussed with her at this point options are going to get another opinion, resume regular work, or get a different job altogether with another employer.  Patient does have some mild spurring in her neck but is on the left side opposite for where she has symptoms which is her right arm. MRI scan of her shoulder showed minimal subacromial subdeltoid bursitis and she had had a previous injection and some of this may be left over artifact.  She had mild acromioclavicular degenerative changes which did not respond to injection treatment.  Rotator cuff was intact biceps tendon was intact labrum was intact. Review of Systems unchanged from last office visit 14 point review of systems updated.   Objective: Vital Signs: Ht 5\' 5"  (1.651 m)   Wt 136 lb (61.7 kg)   BMI 22.63 kg/m   Physical Exam  Constitutional: She is oriented to person, place, and time. She appears well-developed.  HENT:  Head: Normocephalic.  Right Ear: External ear normal.  Left Ear: External ear normal.  Eyes: Pupils are equal, round, and reactive to light.  Neck: No tracheal deviation present. No thyromegaly present.  Cardiovascular: Normal rate.  Pulmonary/Chest: Effort normal.  Abdominal: Soft.  Neurological:  She is alert and oriented to person, place, and time.  Skin: Skin is warm and dry.  Psychiatric: She has a normal mood and affect. Her behavior is normal.    Ortho Exam patient has some left trochanteric tenderness mild left sciatic notch tenderness negative straight leg raising to 90 degrees.  He can get her arm up overhead not described as rapidly on the right shoulder as the left.  No limitation of range of motion negative drop arm test.  Specialty Comments:  No specialty comments available.  Imaging: No results found.   PMFS History: There are no active problems to display for this patient.  Past Medical  History:  Diagnosis Date  . Acid reflux   . Anxiety   . H/O: hysterectomy    Cervical dysplasia  . Menopause ovarian failure   . Migraines     Family History  Problem Relation Age of Onset  . Melanoma Father   . Graves' disease Child     Past Surgical History:  Procedure Laterality Date  . ABDOMINAL HYSTERECTOMY    . TONSILLECTOMY     Social History   Occupational History  . Not on file  Tobacco Use  . Smoking status: Former Research scientist (life sciences)  . Smokeless tobacco: Former Network engineer and Sexual Activity  . Alcohol use: Yes    Comment: occasional  . Drug use: No  . Sexual activity: Not on file

## 2018-02-20 ENCOUNTER — Telehealth (INDEPENDENT_AMBULATORY_CARE_PROVIDER_SITE_OTHER): Payer: Self-pay | Admitting: Orthopaedic Surgery

## 2018-02-20 NOTE — Telephone Encounter (Signed)
Please advise. On 2nd opinion

## 2018-02-20 NOTE — Telephone Encounter (Signed)
Patient called to state that Dr Lorin Mercy advised her to get a 2nd opinion and says she needs a Referral from him.  Please call patient to advise

## 2018-02-20 NOTE — Telephone Encounter (Signed)
Dr. Lorin Mercy said [facility] allows a second opinion, she can talk with them about who she would like to see and we can send records. [facility] has to approve her choice.

## 2018-02-28 ENCOUNTER — Telehealth (INDEPENDENT_AMBULATORY_CARE_PROVIDER_SITE_OTHER): Payer: Self-pay

## 2018-02-28 NOTE — Telephone Encounter (Signed)
Faxed the 02/20/18 office note to case mgr per his request

## 2018-02-28 NOTE — Telephone Encounter (Signed)
Left message for patient advising

## 2018-04-21 ENCOUNTER — Telehealth (INDEPENDENT_AMBULATORY_CARE_PROVIDER_SITE_OTHER): Payer: Self-pay

## 2018-04-21 NOTE — Telephone Encounter (Signed)
Emailed the last 3 office notes and cspine MRI to the case mgr per his request

## 2018-07-23 DIAGNOSIS — M5459 Other low back pain: Secondary | ICD-10-CM | POA: Insufficient documentation

## 2018-07-23 DIAGNOSIS — M47816 Spondylosis without myelopathy or radiculopathy, lumbar region: Secondary | ICD-10-CM | POA: Insufficient documentation

## 2018-07-23 DIAGNOSIS — M4802 Spinal stenosis, cervical region: Secondary | ICD-10-CM | POA: Insufficient documentation

## 2018-07-23 DIAGNOSIS — M5030 Other cervical disc degeneration, unspecified cervical region: Secondary | ICD-10-CM | POA: Insufficient documentation

## 2018-07-23 DIAGNOSIS — M47812 Spondylosis without myelopathy or radiculopathy, cervical region: Secondary | ICD-10-CM | POA: Insufficient documentation

## 2018-07-23 DIAGNOSIS — G894 Chronic pain syndrome: Secondary | ICD-10-CM | POA: Insufficient documentation

## 2018-10-10 DIAGNOSIS — M533 Sacrococcygeal disorders, not elsewhere classified: Secondary | ICD-10-CM | POA: Insufficient documentation

## 2018-11-06 DIAGNOSIS — M1612 Unilateral primary osteoarthritis, left hip: Secondary | ICD-10-CM | POA: Insufficient documentation

## 2018-11-16 DIAGNOSIS — Z96642 Presence of left artificial hip joint: Secondary | ICD-10-CM | POA: Insufficient documentation

## 2018-11-16 DIAGNOSIS — Z96643 Presence of artificial hip joint, bilateral: Secondary | ICD-10-CM | POA: Insufficient documentation

## 2018-11-16 DIAGNOSIS — M87059 Idiopathic aseptic necrosis of unspecified femur: Secondary | ICD-10-CM | POA: Insufficient documentation

## 2018-11-16 HISTORY — PX: TOTAL HIP ARTHROPLASTY: SHX124

## 2019-04-26 LAB — TSH
TSH: 0.01 — AB (ref 0.41–5.90)
TSH: 0.01 — AB (ref 0.41–5.90)

## 2019-04-26 LAB — LIPID PANEL
Cholesterol: 188 (ref 0–200)
HDL: 68 (ref 35–70)
LDL Cholesterol: 102
Triglycerides: 92 (ref 40–160)

## 2019-04-26 LAB — BASIC METABOLIC PANEL
BUN: 7 (ref 4–21)
Creatinine: 0.5 (ref 0.5–1.1)

## 2019-04-26 LAB — CBC AND DIFFERENTIAL
HCT: 38 (ref 36–46)
Hemoglobin: 12.9 (ref 12.0–16.0)
Platelets: 265 (ref 150–399)
WBC: 6.8

## 2019-05-23 ENCOUNTER — Encounter: Payer: Self-pay | Admitting: Internal Medicine

## 2019-06-04 ENCOUNTER — Encounter: Payer: Self-pay | Admitting: Gastroenterology

## 2019-06-04 NOTE — Progress Notes (Signed)
Referring Provider: Dr. Nadara Mustard Primary Care Physician:  Rory Percy, MD Primary Gastroenterologist:  Dr. Gala Romney?  Chief Complaint  Patient presents with  . Elevated Hepatic Enzymes    REf by Dr. Nadara Mustard, elevated lfts    HPI:   Nicole Ewing is a 58 y.o. female with history of GERD presenting today at the request of Dr. Nadara Mustard for elevated LFTs.   Patient states she doesn't remember ever having elevated LFTs in the past. Denies abdominal pain breakthrough reflux, nausea, vomiting, and dysphagia. Occasional postprandial stool urgency with diarrhea depending on what she eats. Salads, eating at olive garden, and corn will cause this. Occurs about twice a month. Otherwise, stools are soft, formed, and daily. No hematochezia, no melena. Denies yellowing of skin, scleral icterus, confusion, swelling in abdomen. Occasional swelling in lower legs since hip replacement in December and has followed up with ortho about this. Thinks she has lost 5-10 lbs since December. No appetite. States she lives with husband who tells her she is fat and lazy. Admits to a lot of stress with she and her husband considering possible divorce, daughter who is "religious" and fusses at her if she does something wrong, and son who is incarcerated.    Currently drinking about 4 beers a day. States she has cut back, but this helps to relax her. History of intranasal drug use when she was in her 31s. No IVDU. No incarceration. Taking tylenol 650 BID and Norco 1-2 times at most daily since total left hip replacement in December 2019. Currently with avascular necrosis of right hip and plans for total right hip replacement on July 02, 2019.   No history personal or family history of IBD. PCP is working up possible thyroid disorder at this time.   No family history of liver disease. Thyroid disorders run in the family per patient. Unsure is hypothyroid or hyperthyroid.     Denies fever, chills, lightheadedness, dizziness.  Denies chest pain. Admits shortness of breath with exertion as she has be mostly sedentary since her surgery. Also admits to some swelling in lower legs since surgery that has been evaluated by ortho and resolves when her legs are elevated in the evening.   Past Medical History:  Diagnosis Date  . Acid reflux   . Anxiety   . H/O: hysterectomy    Cervical dysplasia  . Menopause ovarian failure   . Migraines     Past Surgical History:  Procedure Laterality Date  . ABDOMINAL HYSTERECTOMY    . COLONOSCOPY     [place]  . TONSILLECTOMY    . TOTAL HIP ARTHROPLASTY Left 11/16/2018    Current Outpatient Medications  Medication Sig Dispense Refill  . acetaminophen (TYLENOL) 650 MG CR tablet Take 650 mg by mouth every 8 (eight) hours as needed for pain.    Marland Kitchen aspirin EC 81 MG tablet Take 81 mg by mouth daily.    . cholecalciferol (VITAMIN D) 400 units TABS tablet Take 400 Units by mouth.    . diazepam (VALIUM) 5 MG tablet Take 5 mg by mouth every 6 (six) hours as needed for anxiety.    Marland Kitchen HYDROcodone-acetaminophen (NORCO/VICODIN) 5-325 MG tablet Take 1 tablet by mouth every 6 (six) hours as needed for moderate pain.    . Multiple Vitamin (MULTIVITAMIN WITH MINERALS) TABS tablet Take 1 tablet by mouth daily.    Marland Kitchen omeprazole (PRILOSEC) 20 MG capsule Take 20 mg by mouth daily.    . pregabalin (LYRICA) 50 MG capsule Take  50 mg by mouth 2 (two) times daily.    . vitamin B-12 (CYANOCOBALAMIN) 250 MCG tablet Take 250 mcg by mouth daily.     No current facility-administered medications for this visit.     Allergies as of 06/05/2019 - Review Complete 06/05/2019  Allergen Reaction Noted  . Tramadol  06/05/2019  . Codeine Rash 01/27/2017  . Keflex [cephalexin] Rash 01/27/2017    Family History  Problem Relation Age of Onset  . Melanoma Father   . Graves' disease Child   . Colon polyps Brother   . Colon cancer Neg Hx     Social History   Socioeconomic History  . Marital status: Married     Spouse name: Not on file  . Number of children: Not on file  . Years of education: Not on file  . Highest education level: Not on file  Occupational History  . Not on file  Social Needs  . Financial resource strain: Not on file  . Food insecurity    Worry: Not on file    Inability: Not on file  . Transportation needs    Medical: Not on file    Non-medical: Not on file  Tobacco Use  . Smoking status: Former Research scientist (life sciences)  . Smokeless tobacco: Former Network engineer and Sexual Activity  . Alcohol use: Yes    Comment: about 4 a day  . Drug use: No  . Sexual activity: Not on file  Lifestyle  . Physical activity    Days per week: Not on file    Minutes per session: Not on file  . Stress: Not on file  Relationships  . Social Herbalist on phone: Not on file    Gets together: Not on file    Attends religious service: Not on file    Active member of club or organization: Not on file    Attends meetings of clubs or organizations: Not on file    Relationship status: Not on file  . Intimate partner violence    Fear of current or ex partner: Not on file    Emotionally abused: Not on file    Physically abused: Not on file    Forced sexual activity: Not on file  Other Topics Concern  . Not on file  Social History Narrative  . Not on file    Review of Systems: Gen: See HPI  CV: Denies chest pain, heart palpitations, syncope.   Resp: Denies cough.   GI: See HPI GU : Denies urinary burning, urinary frequency, urinary hesitancy MS: Admits to right hip and knee pain and chronic lower back pain.   Derm: Denies rash, itching, dry skin Psych: Admits to some depression and anxiety, no suicidal ideations.  Heme: Admits to bruising easily. No bleeding.   Physical Exam: BP 129/84   Pulse (!) 107   Temp 99.7 F (37.6 C) (Oral)   Ht 5\' 5"  (1.651 m)   Wt 144 lb 12.8 oz (65.7 kg)   LMP  (Within Years) Comment: 2000  BMI 24.10 kg/m  General:   Alert and oriented. Pleasant and  cooperative. Well-nourished and well-developed. Uses walker.   Head:  Normocephalic and atraumatic. Eyes:  Without icterus, sclera clear and conjunctiva pink.  Ears:  Normal auditory acuity. Nose:  No deformity, discharge,  or lesions. Lungs:  Clear to auscultation bilaterally. No wheezes, rales, or rhonchi. No distress.  Heart:  S1, S2 present without murmurs appreciated.  Abdomen:  +BS, soft, non-tender and  non-distended. No HSM noted. No guarding or rebound. No masses appreciated.  Rectal:  Deferred  Msk:  Symmetrical without gross deformities. Normal posture. Pulses:  Normal pulses noted. Extremities:  Without clubbing or edema. Neurologic:  Alert and  oriented x4;  grossly normal neurologically. Skin:  Intact without significant lesions or rashes. Psych:  Alert and cooperative. Normal mood and affect. Began to get tearful when talking about the stressors in her life and family members who have passed.

## 2019-06-05 ENCOUNTER — Encounter: Payer: Self-pay | Admitting: Gastroenterology

## 2019-06-05 ENCOUNTER — Other Ambulatory Visit: Payer: Self-pay

## 2019-06-05 ENCOUNTER — Ambulatory Visit (INDEPENDENT_AMBULATORY_CARE_PROVIDER_SITE_OTHER): Payer: No Typology Code available for payment source | Admitting: Gastroenterology

## 2019-06-05 ENCOUNTER — Telehealth: Payer: Self-pay | Admitting: Gastroenterology

## 2019-06-05 VITALS — BP 129/84 | HR 107 | Temp 99.7°F | Ht 65.0 in | Wt 144.8 lb

## 2019-06-05 DIAGNOSIS — R945 Abnormal results of liver function studies: Secondary | ICD-10-CM

## 2019-06-05 DIAGNOSIS — R7989 Other specified abnormal findings of blood chemistry: Secondary | ICD-10-CM

## 2019-06-05 NOTE — Telephone Encounter (Signed)
Nicole Ewing, patient has history of elevated LFTs. I am rechecking now. Can you arrange for patient to have repeat HFP just prior to her next office visit in 3 months?

## 2019-06-05 NOTE — Assessment & Plan Note (Addendum)
58 y.o. female with mildly elevated LFTs documented on 04/26/19. AST 62, ALT 78, Alk Phos 160. CBC normal at that time. Patient reports no prior LFT elevation. [place] on 05/08/19 with heterogenous hepatic parenchymal pattern suggesting fatty infiltration and/or hepatocellular disease. No focal significant hepatic abnormalities identified. Two tiny simple cyst in liver. No gallstones or biliary distension. No overt signs/symptoms of liver disease at this time. Drinks about 4 beers daily. States she has cut back. History of intranasal drug use when she was much younger, none currently. No IV drug use. Never been incarcerated. LDL barely elevated on 04/26/19 at 102, without current medication management of cholesterol. No history of IBD or diabetes. PCP currently evaluating for possible thyroid disorder, which runs in family. No family history of liver disease or IBD.   Although ALT is greater than AST, suspect alcohol is playing a role in fatty liver and elevated LFTs. Thyroid disorder may also be playing a role. Patient is taking tylenol and Norco since hip replacement in December 2019; however, daily tylenol dose is between 1,625 mg-1,950 mg and less likely that this is the cause of her elevated LFTs.   At this time will recheck LFTs. Will also check PT/INR, Hep B, Hep C, and iron panel to rule out other etiologies.   Counseled patient on cessation of alcohol and healthy eating habits. Information on fatty liver provided. Also discussed trying to limit tylenol if able. Advised patient to talk with PCP about depression and anxiety.  Will recheck LFTs  prior to next visit and if they remain elevated and other lab work was unrevealing, will likely pursue autoimmune serologies. Consider FibroScan at this time.  Follow-up with patient on outcomes of further thyroid testing.   Follow-up in 3 months.

## 2019-06-05 NOTE — Patient Instructions (Addendum)
1. Please have labs completed at [place]. We will call you with results.   2. We will see you back in 3 months.   If you have questions or concerns prior to your next visit, please call.   Aliene Altes, PA-C [facility]     Fatty Liver Disease  Fatty liver disease occurs when too much fat has built up in your liver cells. Fatty liver disease is also called hepatic steatosis or steatohepatitis. The liver removes harmful substances from your bloodstream and produces fluids that your body needs. It also helps your body use and store energy from the food you eat. In many cases, fatty liver disease does not cause symptoms or problems. It is often diagnosed when tests are being done for other reasons. However, over time, fatty liver can cause inflammation that may lead to more serious liver problems, such as scarring of the liver (cirrhosis) and liver failure. Fatty liver is associated with insulin resistance, increased body fat, high blood pressure (hypertension), and high cholesterol. These are features of metabolic syndrome and increase your risk for stroke, diabetes, and heart disease. What are the causes? This condition may be caused by:  Drinking too much alcohol.  Poor nutrition.  Obesity.  Cushing's syndrome.  Diabetes.  High cholesterol.  Certain drugs.  Poisons.  Some viral infections.  Pregnancy. What increases the risk? You are more likely to develop this condition if you:  Abuse alcohol.  Are overweight.  Have diabetes.  Have hepatitis.  Have a high triglyceride level.  Are pregnant. What are the signs or symptoms? Fatty liver disease often does not cause symptoms. If symptoms do develop, they can include:  Fatigue.  Weakness.  Weight loss.  Confusion.  Abdominal pain.  Nausea and vomiting.  Yellowing of your skin and the white parts of your eyes (jaundice).  Itchy skin. How is this diagnosed? This condition may be  diagnosed by:  A physical exam and medical history.  Blood tests.  Imaging tests, such as an ultrasound, CT scan, or MRI.  A liver biopsy. A small sample of liver tissue is removed using a needle. The sample is then looked at under a microscope. How is this treated? Fatty liver disease is often caused by other health conditions. Treatment for fatty liver may involve medicines and lifestyle changes to manage conditions such as:  Alcoholism.  High cholesterol.  Diabetes.  Being overweight or obese. Follow these instructions at home:   Do not drink alcohol. If you have trouble quitting, ask your health care provider how to safely quit with the help of medicine or a supervised program. This is important to keep your condition from getting worse.  Eat a healthy diet as told by your health care provider. Ask your health care provider about working with a diet and nutrition specialist (dietitian) to develop an eating plan.  Exercise regularly. This can help you lose weight and control your cholesterol and diabetes. Talk to your health care provider about an exercise plan and which activities are best for you.  Take over-the-counter and prescription medicines only as told by your health care provider.  Keep all follow-up visits as told by your health care provider. This is important. Contact a health care provider if: You have trouble controlling your:  Blood sugar. This is especially important if you have diabetes.  Cholesterol.  Drinking of alcohol. Get help right away if:  You have abdominal pain.  You have jaundice.  You have nausea and vomiting.  You vomit blood or material that looks like coffee grounds.  You have stools that are black, tar-like, or bloody. Summary  Fatty liver disease develops when too much fat builds up in the cells of your liver.  Fatty liver disease often causes no symptoms or problems. However, over time, fatty liver can cause inflammation  that may lead to more serious liver problems, such as scarring of the liver (cirrhosis).  You are more likely to develop this condition if you abuse alcohol, are pregnant, are overweight, have diabetes, have hepatitis, or have high triglyceride levels.  Contact your health care provider if you have trouble controlling your weight, blood sugar, cholesterol, or drinking of alcohol. This information is not intended to replace advice given to you by your health care provider. Make sure you discuss any questions you have with your health care provider. Document Released: 01/07/2006 Document Revised: 11/04/2017 Document Reviewed: 08/31/2017 Elsevier Patient Education  2020 [employer].

## 2019-06-06 ENCOUNTER — Encounter: Payer: Self-pay | Admitting: Internal Medicine

## 2019-06-06 ENCOUNTER — Other Ambulatory Visit: Payer: Self-pay

## 2019-06-06 DIAGNOSIS — R7989 Other specified abnormal findings of blood chemistry: Secondary | ICD-10-CM

## 2019-06-06 NOTE — Telephone Encounter (Signed)
Noted. Lab orders placed. Letter will be mailed with lab orders closer to when it's time to have labs done.

## 2019-06-06 NOTE — Progress Notes (Signed)
cc'ed to pcp °

## 2019-06-07 ENCOUNTER — Telehealth: Payer: Self-pay

## 2019-06-07 NOTE — Telephone Encounter (Signed)
Pt called to inquire about lab results on 06/05/19. Pt is aware that our office is closed and we will notify her next week.

## 2019-06-11 ENCOUNTER — Telehealth: Payer: Self-pay | Admitting: Gastroenterology

## 2019-06-11 NOTE — Telephone Encounter (Signed)
Called patient to let her know about her lab results. Hep C Ab was positive. We are waiting on HCV RNA; however, it isn't clear if the lab has sent the sample off or not. I have asked Elmo Putt to check in with the lab to see if they need a new sample or have already sent this for testing. Patient reported that something similar happened about 4-5 years ago when she asked about being screening for Hep C after seeing a commercial. The first positive but a second test came back negative. As we are waiting on results of Hep C RNA, she was advised to avoid sharing toothbrushes, dental, and shaving equipment. Also cautioned to cover bleeding wounds to prevent others coming into contact with her blood.   Patient also asking about postprandial diarrhea. At office visit on 6/30 patient stated this was only occurring about twice a month with otherwise soft daily stools. Today she states she is having diarrhea, loose to watery, with urgency after every meal for about the last 2 years. No hematochezia, melena, or abdominal pain. About 10 lb weight loss since March. No N/V. No recent antibiotics. Left hip replacement in Dec 2019. Number of stools depends on what she eats. After a "loaded hamburger and hotdog" last night she had 3 stools overnight and 2 during the day. Normally about 1 stool per night. Reports history of lactose intolerance, but states she eats nutty buddy ice cream every night. Diarrhea is worse with meals containing higher fat content, but also with salads. Patient drinks alcohol daily. Also being worked up at this time for thyroid disorder. With chronicity, I do not suspect this is an infectious process. At this time I have recommended patient avoid all dairy for 2 weeks and avoid meals with high fat content as these tend to make diarrhea worse. I will request nurse to follow-up with patient in 2 weeks on this. If no improvement, will consider trial of bentyl. Patient will likely need colonoscopy if she has  never had one. Will discuss this when patient is seen in office next.

## 2019-06-11 NOTE — Telephone Encounter (Signed)
Nicole Ewing, can you call patient in 2 weeks to follow up on her postprandial diarrhea. See telephone note on 7/6. She is to trial lactose free diet and avoid meals with hight fat content for the next 2 weeks.

## 2019-06-11 NOTE — Progress Notes (Signed)
Patients Hepatitis C antibody is positive. LFTs remain elevated, although somewhat improved.   Elmo Putt, can you check with the lab on what is going on with the Hep C RNA testing? It is not clear if they are checking viral load or not. Comment states the sample will be tested for HCV RNA, but I do not see anything pending.

## 2019-06-12 NOTE — Telephone Encounter (Signed)
Noted. Will call pt when requested per [facility].

## 2019-06-14 LAB — HEPATITIS B SURFACE ANTIGEN: Hepatitis B Surface Ag: NONREACTIVE

## 2019-06-14 LAB — IRON,TIBC AND FERRITIN PANEL
%SAT: 12 % (calc) — ABNORMAL LOW (ref 16–45)
Ferritin: 67 ng/mL (ref 16–232)
Iron: 38 ug/dL — ABNORMAL LOW (ref 45–160)
TIBC: 312 mcg/dL (calc) (ref 250–450)

## 2019-06-14 LAB — HEPATITIS B SURFACE ANTIBODY,QUALITATIVE: Hep B S Ab: NONREACTIVE

## 2019-06-14 LAB — COMPLETE METABOLIC PANEL WITH GFR
AG Ratio: 1.3 (calc) (ref 1.0–2.5)
ALT: 40 U/L — ABNORMAL HIGH (ref 6–29)
AST: 38 U/L — ABNORMAL HIGH (ref 10–35)
Albumin: 3.7 g/dL (ref 3.6–5.1)
Alkaline phosphatase (APISO): 159 U/L — ABNORMAL HIGH (ref 37–153)
BUN/Creatinine Ratio: 12 (calc) (ref 6–22)
BUN: 5 mg/dL — ABNORMAL LOW (ref 7–25)
CO2: 24 mmol/L (ref 20–32)
Calcium: 9.8 mg/dL (ref 8.6–10.4)
Chloride: 107 mmol/L (ref 98–110)
Creat: 0.43 mg/dL — ABNORMAL LOW (ref 0.50–1.05)
GFR, Est African American: 130 mL/min/{1.73_m2} (ref >=60)
GFR, Est Non African American: 112 mL/min/{1.73_m2} (ref >=60)
Globulin: 2.9 g/dL (calc) (ref 1.9–3.7)
Glucose, Bld: 110 mg/dL (ref 65–139)
Potassium: 3.9 mmol/L (ref 3.5–5.3)
Sodium: 142 mmol/L (ref 135–146)
Total Bilirubin: 0.3 mg/dL (ref 0.2–1.2)
Total Protein: 6.6 g/dL (ref 6.1–8.1)

## 2019-06-14 LAB — PROTIME-INR
INR: 0.9
Prothrombin Time: 9.9 s (ref 9.0–11.5)

## 2019-06-14 LAB — HEPATITIS C ANTIBODY
Hepatitis C Ab: REACTIVE — AB
SIGNAL TO CUT-OFF: 8.59 — ABNORMAL HIGH (ref <1.00)

## 2019-06-14 LAB — HCV RNA,QUANTITATIVE REAL TIME PCR
HCV Quantitative Log: <1.18 Log IU/mL
HCV RNA, PCR, QN: <15 IU/mL

## 2019-06-14 LAB — HEPATITIS B CORE ANTIBODY, TOTAL: Hep B Core Total Ab: NONREACTIVE

## 2019-06-15 ENCOUNTER — Telehealth: Payer: Self-pay | Admitting: Gastroenterology

## 2019-06-15 NOTE — Telephone Encounter (Signed)
Nicole Ewing, can you let patient know Hep C RNA was not detected meaning the virus was not detected in her body and there is no evidence of active infection. Possible this was a false positive, but more likely a resolved past infection considering history of intranasal drug use. Will proceed with rechecking HFP prior to next office visit as LFTs are just slightly elevated at this time. If LFTs remain elevated at that time, we will consider further testing. Patient needs to avoid alcohol use.

## 2019-06-15 NOTE — Telephone Encounter (Signed)
Pt notified of results. Pt is aware that she will have labs done prior to her apt.

## 2019-06-15 NOTE — Progress Notes (Signed)
Hep C RNA Not Detected. No evidence of active infection. Possible this was a false positive, but more likely a resolved past infection considering patient history of intranasal drug use. Will proceed with rechecking HFP prior to next office visit as LFTs are just slightly elevated at this time. If LFTs remain elevated, consider further testing at that time. Patient needs to limit alcohol use.

## 2019-06-18 ENCOUNTER — Telehealth: Payer: Self-pay | Admitting: Gastroenterology

## 2019-06-18 NOTE — Telephone Encounter (Signed)
Nicole Ewing, could you let patient know I have reviewed patient chart a little closer and see that her hip replacement is July 27th. I also saw she was on antibiotics for 6 weeks with her last hip replacement. Due to ongoing diarrhea, history of 6 weeks of antibiotics in December after her hip replacement, elevated LFTs, and her up upcoming surgery, I would like to go ahead and get some labs to rule out a few more things rather than waiting a full two weeks to see how the dairy free and low fat diet is doing. She will likely need a colonoscopy, but we will not be able to complete that before her surgery. We will follow-up on that when we see her in office again.   I would like to check her stool for bacteria, check her for celiac disease, and check for signs of inflammation in her colon. She may desire to have labs completed closer to home. Could you arrange for GI pathogen panel, C. diff GDH with toxin A/B, IgA, and TTG IgA, CRP, and fecal calprotectin?

## 2019-06-19 NOTE — Telephone Encounter (Signed)
LMOM, waiting on a return call.  

## 2019-06-19 NOTE — Telephone Encounter (Signed)
Spoke with pt. She wants to think about what she needs to have done overnight. Pt states that she has so many things to do prior to hip surgery. Pt will call back in 1-2 days.

## 2019-06-19 NOTE — Telephone Encounter (Signed)
VM received, pt returned call. Lmom, waiting on a return call from pt.

## 2019-07-02 HISTORY — PX: TOTAL HIP ARTHROPLASTY: SHX124

## 2019-07-24 ENCOUNTER — Other Ambulatory Visit: Payer: Self-pay

## 2019-07-24 DIAGNOSIS — R7989 Other specified abnormal findings of blood chemistry: Secondary | ICD-10-CM

## 2019-08-14 ENCOUNTER — Ambulatory Visit: Payer: No Typology Code available for payment source | Admitting: "Endocrinology

## 2019-08-15 ENCOUNTER — Other Ambulatory Visit: Payer: Self-pay

## 2019-08-15 ENCOUNTER — Ambulatory Visit: Payer: No Typology Code available for payment source | Admitting: "Endocrinology

## 2019-08-15 ENCOUNTER — Encounter: Payer: Self-pay | Admitting: "Endocrinology

## 2019-08-15 VITALS — BP 107/75 | HR 89 | Ht 65.0 in | Wt 142.0 lb

## 2019-08-15 DIAGNOSIS — E0590 Thyrotoxicosis, unspecified without thyrotoxic crisis or storm: Secondary | ICD-10-CM

## 2019-08-15 NOTE — Progress Notes (Signed)
Endocrinology Consult Note                                            08/15/2019, 4:39 PM   Subjective:    Patient ID: Nicole Ewing, female    DOB: [birth year], PCP Rory Percy, MD   Past Medical History:  Diagnosis Date  . Acid reflux   . Anxiety   . Cervical cancer ([place])   . COPD (chronic obstructive pulmonary disease) ([place])   . H/O: hysterectomy    Cervical dysplasia  . Menopause ovarian failure   . Migraines    Past Surgical History:  Procedure Laterality Date  . ABDOMINAL HYSTERECTOMY    . COLONOSCOPY     [place]  . TONSILLECTOMY    . TOTAL HIP ARTHROPLASTY Left 11/16/2018   Social History   Socioeconomic History  . Marital status: Married    Spouse name: Not on file  . Number of children: Not on file  . Years of education: Not on file  . Highest education level: Not on file  Occupational History  . Not on file  Social Needs  . Financial resource strain: Not on file  . Food insecurity    Worry: Not on file    Inability: Not on file  . Transportation needs    Medical: Not on file    Non-medical: Not on file  Tobacco Use  . Smoking status: Current Every Day Smoker    Types: Cigars  . Smokeless tobacco: Former Systems developer  . Tobacco comment: 1-2 cigars daily  Substance and Sexual Activity  . Alcohol use: Yes    Comment: 4-6 beers daily  . Drug use: Yes  . Sexual activity: Not on file  Lifestyle  . Physical activity    Days per week: Not on file    Minutes per session: Not on file  . Stress: Not on file  Relationships  . Social Herbalist on phone: Not on file    Gets together: Not on file    Attends religious service: Not on file    Active member of club or organization: Not on file    Attends meetings of clubs or organizations: Not on file    Relationship status: Not on file  Other Topics Concern  . Not on file  Social History Narrative  . Not on file   Family History  Problem Relation Age of Onset  . Melanoma Father   .  Graves' disease Child   . Colon polyps Brother   . Colon cancer Neg Hx    Outpatient Encounter Medications as of 08/15/2019  Medication Sig  . Calcium Carbonate-Vitamin D (CALCIUM 600+D PO) Take by mouth daily.  . diazepam (VALIUM) 5 MG tablet Take 5 mg by mouth every 6 (six) hours as needed for anxiety.  . methocarbamol (ROBAXIN) 500 MG tablet Take 500 mg by mouth 4 (four) times daily.  . Multiple Vitamin (MULTIVITAMIN WITH MINERALS) TABS tablet Take 1 tablet by mouth daily.  Marland Kitchen omeprazole (PRILOSEC) 20 MG capsule Take 20 mg by mouth daily.  . pregabalin (LYRICA) 100 MG capsule Take 100 mg by mouth 2 (two) times daily.   . vitamin B-12 (CYANOCOBALAMIN) 250 MCG tablet Take 250 mcg by mouth daily.  Marland Kitchen acetaminophen (TYLENOL) 650 MG CR tablet Take 650 mg by mouth every 8 (eight)  hours as needed for pain.  Marland Kitchen aspirin EC 81 MG tablet Take 81 mg by mouth daily.  . [DISCONTINUED] cholecalciferol (VITAMIN D) 400 units TABS tablet Take 400 Units by mouth.  . [DISCONTINUED] HYDROcodone-acetaminophen (NORCO/VICODIN) 5-325 MG tablet Take 1 tablet by mouth every 6 (six) hours as needed for moderate pain.   No facility-administered encounter medications on file as of 08/15/2019.    ALLERGIES: Allergies  Allergen Reactions  . Tramadol   . Codeine Rash  . Keflex [Cephalexin] Rash    VACCINATION STATUS:  There is no immunization history on file for this patient.  HPI Nicole Ewing is 58 y.o. female who presents today with a medical history as above. she is being seen in consultation for hyperthyroidism requested by Rory Percy, MD. She was diagnosed with hyperthyroidism in May 2020 when she was initiated on methimazole 10 mg p.o. daily. She did have symptoms including unintentional weight loss prior to her treatment, and responded by gaining a few of her pounds back.   Currently, she denies palpitations, heat intolerance, tremors.  She does have family history of thyroid dysfunction in her  daughter.    She denies dysphagia, shortness of breath, nor voice change. Her most recent TSH is still suppressed at  < 0.005 on August 07, 2019. Patient is a poor historian, accompanied by her grown daughter who has history of hyperthyroidism which required I-131 treatment.   She admits to upto 6 beers daily.  Review of Systems  Constitutional: + Fluctuating weight,  + fatigue, no subjective hyperthermia, no subjective hypothermia Eyes: no blurry vision, no xerophthalmia ENT: no sore throat, no nodules palpated in throat, no dysphagia/odynophagia, no hoarseness Cardiovascular: no Chest Pain, no Shortness of Breath, no palpitations, no leg swelling Respiratory: no cough, no shortness of breath Gastrointestinal: no Nausea/Vomiting/Diarhhea Musculoskeletal: no muscle/joint aches Skin: no rashes Neurological: no tremors, no numbness, no tingling, no dizziness Psychiatric: no depression, no anxiety  Objective:    BP 107/75   Pulse 89   Ht 5\' 5"  (1.651 m)   Wt 142 lb (64.4 kg)   BMI 23.63 kg/m   Wt Readings from Last 3 Encounters:  08/15/19 142 lb (64.4 kg)  06/05/19 144 lb 12.8 oz (65.7 kg)  02/16/18 136 lb (61.7 kg)    Physical Exam  Constitutional:  Body mass index is 23.63 kg/m.,  not in acute distress, normal state of mind Eyes: PERRLA, EOMI, no exophthalmos ENT: moist mucous membranes, no gross thyromegaly, no gross cervical lymphadenopathy Cardiovascular: normal precordial activity, Regular Rate and Rhythm, no Murmur/Rubs/Gallops Respiratory:  adequate breathing efforts, no gross chest deformity, Clear to auscultation bilaterally Gastrointestinal: abdomen soft, Non -tender, No distension, Bowel Sounds present, no gross organomegaly Musculoskeletal: no gross deformities, strength intact in all four extremities Skin: moist, warm, no rashes Neurological: no tremor with outstretched hands, Deep tendon reflexes normal in bilateral lower extremities.  She has significant  cognitive deficit.  CMP ( most recent) CMP     Component Value Date/Time   NA 142 06/05/2019 1446   K 3.9 06/05/2019 1446   CL 107 06/05/2019 1446   CO2 24 06/05/2019 1446   GLUCOSE 110 06/05/2019 1446   BUN 5 (L) 06/05/2019 1446   BUN 7 04/26/2019   CREATININE 0.43 (L) 06/05/2019 1446   CALCIUM 9.8 06/05/2019 1446   PROT 6.6 06/05/2019 1446   AST 38 (H) 06/05/2019 1446   ALT 40 (H) 06/05/2019 1446   BILITOT 0.3 06/05/2019 1446   GFRNONAA 112 06/05/2019  1446   GFRAA 130 06/05/2019 1446    Lipid Panel     Component Value Date/Time   CHOL 188 04/26/2019   TRIG 92 04/26/2019   HDL 68 04/26/2019   LDLCALC 102 04/26/2019      Lab Results  Component Value Date   TSH 0.01 (A) 04/26/2019   TSH 0.01 (A) 04/26/2019      Assessment & Plan:   1. Hyperthyroidism  - AMIYLAH WEIDEMAN  is being seen at a kind request of Rory Percy, MD. - I have reviewed her available thyroid records and clinically evaluated the patient. -She is already initiated on methimazole treatment which she has taken since July 2020, TSH still undetectable.  She has responded with symptom control, currently stable.  She will eventually require I-131 therapy preceded by thyroid uptake and scan.  Given her history of alcoholism, long-term methimazole  therapy is  unsafe for her. However, for short-term, she will be continued on methimazole 10 mg p.o. daily for the next 6 weeks until full profile thyroid function tests.  -She has elevated transaminases likely related to alcohol abuse which needs close follow-up. -She is advised to return in 7 weeks with thyroid function test for reevaluation.    I did not initiate any new prescriptions today. - I advised her  to maintain close follow up with Rory Percy, MD for primary care needs.   - Time spent with the patient: 45 minutes, of which >50% was spent in obtaining information about her symptoms, reviewing her previous labs/studies,  evaluations, and  treatments, counseling her about her hyperthyroidism, and developing a plan to confirm the diagnosis and long term treatment based on the latest standards of care/guidelines.    Salomon Mast Ignatowski participated in the discussions, expressed understanding, and voiced agreement with the above plans.  All questions were answered to her satisfaction. she is encouraged to contact clinic should she have any questions or concerns prior to her return visit.  Follow up plan: Return in about 7 weeks (around 10/03/2019) for Follow up with Pre-visit Labs.   Glade Lloyd, MD [facility] [address] [place], [place] [address] Phone: [phone]  Fax: [phone]     08/15/2019, 4:39 PM  This note was partially dictated with voice recognition software. Similar sounding words can be transcribed inadequately or may not  be corrected upon review.

## 2019-08-23 ENCOUNTER — Ambulatory Visit: Payer: No Typology Code available for payment source | Admitting: "Endocrinology

## 2019-09-01 LAB — HEPATIC FUNCTION PANEL
AG Ratio: 1.4 (calc) (ref 1.0–2.5)
ALT: 16 U/L (ref 6–29)
AST: 24 U/L (ref 10–35)
Albumin: 3.8 g/dL (ref 3.6–5.1)
Alkaline phosphatase (APISO): 158 U/L — ABNORMAL HIGH (ref 37–153)
Bilirubin, Direct: 0 mg/dL (ref 0.0–0.2)
Globulin: 2.8 g/dL (calc) (ref 1.9–3.7)
Indirect Bilirubin: 0.2 mg/dL (calc) (ref 0.2–1.2)
Total Bilirubin: 0.2 mg/dL (ref 0.2–1.2)
Total Protein: 6.6 g/dL (ref 6.1–8.1)

## 2019-09-06 ENCOUNTER — Ambulatory Visit (INDEPENDENT_AMBULATORY_CARE_PROVIDER_SITE_OTHER): Payer: No Typology Code available for payment source | Admitting: Nurse Practitioner

## 2019-09-06 ENCOUNTER — Other Ambulatory Visit: Payer: Self-pay

## 2019-09-06 ENCOUNTER — Encounter: Payer: Self-pay | Admitting: Nurse Practitioner

## 2019-09-06 VITALS — BP 126/84 | HR 83 | Temp 97.2°F | Ht 65.0 in | Wt 145.2 lb

## 2019-09-06 DIAGNOSIS — R7989 Other specified abnormal findings of blood chemistry: Secondary | ICD-10-CM

## 2019-09-06 DIAGNOSIS — Z0000 Encounter for general adult medical examination without abnormal findings: Secondary | ICD-10-CM | POA: Insufficient documentation

## 2019-09-06 NOTE — Assessment & Plan Note (Signed)
AST/ALT have since normalized.  Her alkaline phosphatase remains elevated mildly, but stable.  She has decreased the amount of Tylenol she has taken and is only taking Norco once a day, no over-the-counter Tylenol.  She continues to drink 2-6 beers a day despite recommendations to cut back and/or abstain.  At this point given persistent elevation of alkaline phosphatase we will check a GGT, ANA, antimitochondrial antibodies, smooth muscle antibodies.  I have also recommended that she obtain a hepatitis B vaccine from primary care due to previous labs showing susceptibility.  Return for follow-up in 2 months.

## 2019-09-06 NOTE — Patient Instructions (Signed)
Your health issues we discussed today were:   Elevated liver enzymes: 1. As previously recommended, you should reduce the amount of alcohol you are drinking and try to abstain from alcohol while we are evaluating your liver function 2. Have your labs completed when you are able to.  You can wait until you are thyroid labs are drawn to have these drawn as well 3. Discussed with your primary care provider about updating your hepatitis B vaccine to prevent contracting hepatitis B 4. Call us with any worsening or significant problems  Possible need for colonoscopy: 1. We will request her previous colonoscopy records from [facility] to determine when he will be next due for your colonoscopy 2. Further recommendations to follow-up  Overall I recommend:  1. Continue your other current medications 2. Return for follow-up in 2 months 3. Call us if you have any questions or concerns.   Because of recent events of COVID-19 ("Coronavirus"), follow CDC recommendations:  1. Wash your hand frequently 2. Avoid touching your face 3. Stay away from people who are sick 4. If you have symptoms such as fever, cough, shortness of breath then call your healthcare provider for further guidance 5. If you are sick, STAY AT HOME unless otherwise directed by your healthcare provider. 6. Follow directions from state and national officials regarding staying safe   At [facility] we value your feedback. You may receive a survey about your visit today. Please share your experience as we strive to create trusting relationships with our patients to provide genuine, compassionate, quality care.  We appreciate your understanding and patience as we review any laboratory studies, imaging, and other diagnostic tests that are ordered as we care for you. Our office policy is 5 business days for review of these results, and any emergent or urgent results are addressed in a timely manner for your best  interest. If you do not hear from our office in 1 week, please contact us.   We also encourage the use of MyChart, which contains your medical information for your review as well. If you are not enrolled in this feature, an access code is on this after visit summary for your convenience. Thank you for allowing [place] to be involved in your care.  It was great to see you today!  I hope you have a great Fall!!

## 2019-09-06 NOTE — Assessment & Plan Note (Signed)
The patient is asking if she is due for another colonoscopy.  Her last one was at [facility].  She is not sure when it was completed.  We will request records to determine when her last colonoscopy was and confirmed recommended repeat interval in order to know when she is due for her next colonoscopy.  Return for follow-up in 2 months.

## 2019-09-06 NOTE — Progress Notes (Signed)
Referring Provider: Rory Percy, MD Primary Care Physician:  Rory Percy, MD Primary GI:  Dr. Gala Romney  Chief Complaint  Patient presents with   elevated LFT's    HPI:   Nicole Ewing is a 58 y.o. female who presents for follow-up on elevated liver enzymes.  This is a referral from primary care.  Intermittent/rare loose stools depending on what she eats, typically occurs about twice a month.  Denies overt hepatic symptoms.  Feels like she has lost 5 to 10 pounds in the past 6 months.  Significant stress with possible pending divorce.  Admitted drinking 4 beers a day, but has cut back.  History of intranasal drug use in her 75s.  No IV drug use or incarceration.  Takes Tylenol 650 mg twice daily and Norco 1-2 times a day at most since hip replacement.  Plans for hip replacement in July 2020.  History of liver disease.  You have labs show an ALT greater than AST and suspect alcohol playing a role in fatty liver.  Recommended recheck LFTs, INR, hepatitis B and C, iron panel.  Counseled on alcohol cessation and healthy eating.  Recommended limiting Tylenol if able.  Consider fiber scan at next office visit pending lab results.  Completed 06/05/2019 including CMP which found mild transaminitis with AST/ALT 38/40, mild elevation of alk phos at 159.  Iron mildly low at 38, ferritin normal at 67.  Hepatitis B serologies negative indicating no immunity, hepatitis C antibody positive but hepatitis C RNA undetectable indicating past/resolved infection.  INR normal at 0.9.  Recommended recheck HFP prior to next office visit and if they remain elevated consider further testing.  Limit alcohol use.  Previously completed right upper quadrant ultrasound at [facility] dated 05/08/2019 and completed due to abnormal LFTs found no acute abnormality, no gallstones or biliary distention, heterogenous hepatic parenchymal pattern suggestive of fatty infiltration or hepatocellular disease.   Specifically CBD diameter was 3.4 mm.  Also noted 2 tiny simple cysts in the liver.  Repeat labs completed 08/31/2019 show normalization of AST/ALT. Alk phos stable/elevated at 158 (ULN 153). Bilirubin (both direct and indirect) remains normal.  Today she states she is doing okay overall. She has not abstained from ETOH like was recommended. She has been drinking 2-6 beers a day. Denies abdominal pain, N/V, hematochezia, melena, fever, chills, unintentional weight loss. Denies yellow of skin/eyes, darkened urine, acute confusion. Denies URI or flu-like symptoms. Denies loss of sense of taste or smell. Denies chest pain, dyspnea, dizziness, lightheadedness, syncope, near syncope. Denies any other upper or lower GI symptoms.  Just had hip replacement recently, doing well post-op. Not taking Tylenol currently, taking Norco once every morning.  As I was leaving the exam room she asks if it's time for a colonoscopy. States she had one before, doesn't know when, thinks it was at [facility], thinks they recommended a 10 year repeat.  Past Medical History:  Diagnosis Date   Acid reflux    Anxiety    Cervical cancer ([facility])    COPD (chronic obstructive pulmonary disease) ([facility])    H/O: hysterectomy    Cervical dysplasia   Menopause ovarian failure    Migraines     Past Surgical History:  Procedure Laterality Date   ABDOMINAL HYSTERECTOMY     COLONOSCOPY     [place]   TONSILLECTOMY     TOTAL HIP ARTHROPLASTY Left 11/16/2018    Current Outpatient Medications  Medication Sig Dispense Refill   acetaminophen (  TYLENOL) 650 MG CR tablet Take 650 mg by mouth every 8 (eight) hours as needed for pain.     aspirin EC 81 MG tablet Take 81 mg by mouth daily.     Calcium Carbonate-Vitamin D (CALCIUM 600+D PO) Take by mouth daily.     diazepam (VALIUM) 5 MG tablet Take 5 mg by mouth every 6 (six) hours as needed for anxiety.     HYDROcodone-acetaminophen (NORCO/VICODIN) 5-325 MG tablet  Take 1 tablet by mouth every 8 (eight) hours as needed.     methimazole (TAPAZOLE) 10 MG tablet Take 1 tablet by mouth daily.     methocarbamol (ROBAXIN) 500 MG tablet Take 500 mg by mouth 4 (four) times daily.     Multiple Vitamin (MULTIVITAMIN WITH MINERALS) TABS tablet Take 1 tablet by mouth daily.     omeprazole (PRILOSEC) 20 MG capsule Take 20 mg by mouth daily.     pregabalin (LYRICA) 100 MG capsule Take 100 mg by mouth 2 (two) times daily.      vitamin B-12 (CYANOCOBALAMIN) 250 MCG tablet Take 250 mcg by mouth daily.     No current facility-administered medications for this visit.     Allergies as of 09/06/2019 - Review Complete 09/06/2019  Allergen Reaction Noted   Tramadol  06/05/2019   Codeine Rash 01/27/2017   Keflex [cephalexin] Rash 01/27/2017    Family History  Problem Relation Age of Onset   Melanoma Father    Graves' disease Child    Colon polyps Brother    Colon cancer Neg Hx     Social History   Socioeconomic History   Marital status: Married    Spouse name: Not on file   Number of children: Not on file   Years of education: Not on file   Highest education level: Not on file  Occupational History   Not on file  Social Needs   Financial resource strain: Not on file   Food insecurity    Worry: Not on file    Inability: Not on file   Transportation needs    Medical: Not on file    Non-medical: Not on file  Tobacco Use   Smoking status: Current Every Day Smoker    Types: Cigars   Smokeless tobacco: Former Systems developer   Tobacco comment: 1-2 cigars daily  Substance and Sexual Activity   Alcohol use: Yes    Comment: 4-6 beers daily   Drug use: Not Currently   Sexual activity: Not on file  Lifestyle   Physical activity    Days per week: Not on file    Minutes per session: Not on file   Stress: Not on file  Relationships   Social connections    Talks on phone: Not on file    Gets together: Not on file    Attends religious  service: Not on file    Active member of club or organization: Not on file    Attends meetings of clubs or organizations: Not on file    Relationship status: Not on file  Other Topics Concern   Not on file  Social History Narrative   Not on file    Review of Systems: General: Negative for anorexia, weight loss, fever, chills, fatigue, weakness. ENT: Negative for hoarseness, difficulty swallowing. CV: Negative for chest pain, angina, palpitations, peripheral edema.  Respiratory: Negative for dyspnea at rest, cough, sputum, wheezing.  GI: See history of present illness. Endo: Negative for unusual weight change.  Heme: Negative for bruising  or bleeding. Allergy: Negative for rash or hives.   Physical Exam: BP 126/84    Pulse 83    Temp (!) 97.2 F (36.2 C) (Oral)    Ht '5\' 5"'  (1.651 m)    Wt 145 lb 3.2 oz (65.9 kg)    BMI 24.16 kg/m  General:   Alert and oriented. Pleasant and cooperative. Well-nourished and well-developed. Ambulates with a cane. Eyes:  Without icterus, sclera clear and conjunctiva pink.  Ears:  Normal auditory acuity. Cardiovascular:  S1, S2 present without murmurs appreciated. Normal pulses noted. Extremities without clubbing or edema. Respiratory:  Clear to auscultation bilaterally. No wheezes, rales, or rhonchi. No distress.  Gastrointestinal:  +BS, soft, non-tender and non-distended. No HSM noted. No guarding or rebound. No masses appreciated.  Rectal:  Deferred  Musculoskalatal:  Symmetrical without gross deformities. Neurologic:  Alert and oriented x4;  grossly normal neurologically. Psych:  Alert and cooperative. Normal mood and affect. Heme/Lymph/Immune: No excessive bruising noted.    09/06/2019 10:42 AM   Disclaimer: This note was dictated with voice recognition software. Similar sounding words can inadvertently be transcribed and may not be corrected upon review.

## 2019-09-24 ENCOUNTER — Telehealth: Payer: Self-pay | Admitting: Orthopaedic Surgery

## 2019-09-24 NOTE — Telephone Encounter (Signed)
Received vm from pt checking on forms/request for records for her atty. IC,lmvm advising we havn't received anything. I left our fax number for her atty to re send a request.

## 2019-09-27 ENCOUNTER — Telehealth: Payer: Self-pay

## 2019-09-27 LAB — T4, FREE: Free T4: 0.7 ng/dL — ABNORMAL LOW (ref 0.8–1.8)

## 2019-09-27 LAB — THYROGLOBULIN ANTIBODY: Thyroglobulin Ab: <1 IU/mL (ref <=1)

## 2019-09-27 LAB — T3, FREE: T3, Free: 2.8 pg/mL (ref 2.3–4.2)

## 2019-09-27 LAB — THYROID PEROXIDASE ANTIBODY: Thyroperoxidase Ab SerPl-aCnc: 2 IU/mL (ref <9)

## 2019-09-27 LAB — TSH: TSH: 0.95 mIU/L (ref 0.40–4.50)

## 2019-09-27 NOTE — Telephone Encounter (Signed)
-----   Message from Cassandria Anger, MD sent at 09/27/2019  9:08 AM EDT ----- Maudie Mercury, This patient needs to stop her methimazole, keep her appointment.

## 2019-09-27 NOTE — Telephone Encounter (Signed)
Lft msg for pt to call back 

## 2019-09-30 LAB — ANA: Anti Nuclear Antibody (ANA): POSITIVE — AB

## 2019-09-30 LAB — ANTI-NUCLEAR AB-TITER (ANA TITER): ANA Titer 1: 1:1280 {titer} — ABNORMAL HIGH

## 2019-09-30 LAB — MITOCHONDRIAL ANTIBODIES: Mitochondrial M2 Ab, IgG: 173 U — ABNORMAL HIGH

## 2019-09-30 LAB — ANTI-SMOOTH MUSCLE ANTIBODY, IGG: Actin (Smooth Muscle) Antibody (IGG): <20 U (ref <20)

## 2019-09-30 LAB — GAMMA GT: GGT: 44 U/L (ref 3–70)

## 2019-10-03 NOTE — Telephone Encounter (Signed)
Called pt x 3. No response. Awaiting return call

## 2019-10-08 ENCOUNTER — Encounter: Payer: Self-pay | Admitting: "Endocrinology

## 2019-10-08 ENCOUNTER — Ambulatory Visit (INDEPENDENT_AMBULATORY_CARE_PROVIDER_SITE_OTHER): Payer: No Typology Code available for payment source | Admitting: "Endocrinology

## 2019-10-08 ENCOUNTER — Other Ambulatory Visit: Payer: Self-pay

## 2019-10-08 DIAGNOSIS — E0590 Thyrotoxicosis, unspecified without thyrotoxic crisis or storm: Secondary | ICD-10-CM | POA: Diagnosis not present

## 2019-10-08 NOTE — Progress Notes (Signed)
10/08/2019, 11:55 AM                                Endocrinology Telehealth Visit Follow up Note -During COVID -19 Pandemic  I connected with Nicole Ewing on 10/08/2019   by telephone and verified that I am speaking with the correct person using two identifiers. Nicole Ewing, November 02, 1961. she has verbally consented to this visit. All issues noted in this document were discussed and addressed. The format was not optimal for physical exam.   Subjective:    Patient ID: Nicole Ewing, female    DOB: 12-14-1960, PCP Nicole Percy, MD   Past Medical History:  Diagnosis Date  . Acid reflux   . Anxiety   . Cervical cancer ([place])   . COPD (chronic obstructive pulmonary disease) ([place])   . H/O: hysterectomy    Cervical dysplasia  . Menopause ovarian failure   . Migraines    Past Surgical History:  Procedure Laterality Date  . ABDOMINAL HYSTERECTOMY    . COLONOSCOPY     [place]  . TONSILLECTOMY    . TOTAL HIP ARTHROPLASTY Left 11/16/2018   Social History   Socioeconomic History  . Marital status: Married    Spouse name: Not on file  . Number of children: Not on file  . Years of education: Not on file  . Highest education level: Not on file  Occupational History  . Not on file  Social Needs  . Financial resource strain: Not on file  . Food insecurity    Worry: Not on file    Inability: Not on file  . Transportation needs    Medical: Not on file    Non-medical: Not on file  Tobacco Use  . Smoking status: Current Every Day Smoker    Types: Cigars  . Smokeless tobacco: Former Systems developer  . Tobacco comment: 1-2 cigars daily  Substance and Sexual Activity  . Alcohol use: Yes    Comment: 4-6 beers daily  . Drug use: Not Currently  . Sexual activity: Not on file  Lifestyle  . Physical activity    Days per week: Not on file    Minutes per session: Not on file  . Stress: Not on file  Relationships  . Social Clinical research associate on phone: Not on file    Gets together: Not on file    Attends religious service: Not on file    Active member of club or organization: Not on file    Attends meetings of clubs or organizations: Not on file    Relationship status: Not on file  Other Topics Concern  . Not on file  Social History Narrative  . Not on file   Family History  Problem Relation Age of Onset  . Melanoma Father   . Graves' disease Child   . Colon polyps Brother   . Colon cancer Neg Hx    Outpatient Encounter Medications as of 10/08/2019  Medication Sig  . acetaminophen (TYLENOL) 650 MG CR tablet Take 650 mg by mouth every 8 (eight) hours as needed for pain.  Marland Kitchen aspirin EC 81 MG tablet Take 81  mg by mouth daily.  . Calcium Carbonate-Vitamin D (CALCIUM 600+D PO) Take by mouth daily.  . diazepam (VALIUM) 5 MG tablet Take 5 mg by mouth every 6 (six) hours as needed for anxiety.  Marland Kitchen HYDROcodone-acetaminophen (NORCO/VICODIN) 5-325 MG tablet Take 1 tablet by mouth every 8 (eight) hours as needed.  . methocarbamol (ROBAXIN) 500 MG tablet Take 500 mg by mouth 4 (four) times daily.  . Multiple Vitamin (MULTIVITAMIN WITH MINERALS) TABS tablet Take 1 tablet by mouth daily.  Marland Kitchen omeprazole (PRILOSEC) 20 MG capsule Take 20 mg by mouth daily.  . pregabalin (LYRICA) 100 MG capsule Take 100 mg by mouth 2 (two) times daily.   . vitamin B-12 (CYANOCOBALAMIN) 250 MCG tablet Take 250 mcg by mouth daily.  . [DISCONTINUED] methimazole (TAPAZOLE) 10 MG tablet Take 1 tablet by mouth daily.   No facility-administered encounter medications on file as of 10/08/2019.    ALLERGIES: Allergies  Allergen Reactions  . Tramadol   . Codeine Rash  . Keflex [Cephalexin] Rash    VACCINATION STATUS:  There is no immunization history on file for this patient.  HPI Nicole Ewing is 58 y.o. female who is being engaged in telehealth via telephone after she was seen in consultation for hyperthyroidism. PMD: Nicole Percy, MD. She was  diagnosed with hyperthyroidism in May 2020 when she was initiated on methimazole 10 mg p.o. daily. -After her thyroid function tests were reviewed on September 26, 2019, she was advised to discontinue methimazole. Currently, she denies palpitations, heat intolerance, tremors.  She does have family history of thyroid dysfunction in her daughter.    She denies dysphagia, shortness of breath, nor voice change. Patient is a poor historian, accompanied by her grown daughter who has history of hyperthyroidism which required I-131 treatment.   She admits to upto 6 beers daily.  Review of Systems Limited as above.  Objective:    There were no vitals taken for this visit.  Wt Readings from Last 3 Encounters:  09/06/19 145 lb 3.2 oz (65.9 kg)  08/15/19 142 lb (64.4 kg)  06/05/19 144 lb 12.8 oz (65.7 kg)    Physical Exam  CMP ( most recent) CMP     Component Value Date/Time   NA 142 06/05/2019 1446   K 3.9 06/05/2019 1446   CL 107 06/05/2019 1446   CO2 24 06/05/2019 1446   GLUCOSE 110 06/05/2019 1446   BUN 5 (L) 06/05/2019 1446   BUN 7 04/26/2019   CREATININE 0.43 (L) 06/05/2019 1446   CALCIUM 9.8 06/05/2019 1446   PROT 6.6 08/31/2019 1124   AST 24 08/31/2019 1124   ALT 16 08/31/2019 1124   BILITOT 0.2 08/31/2019 1124   GFRNONAA 112 06/05/2019 1446   GFRAA 130 06/05/2019 1446    Lipid Panel     Component Value Date/Time   CHOL 188 04/26/2019   TRIG 92 04/26/2019   HDL 68 04/26/2019   LDLCALC 102 04/26/2019      Lab Results  Component Value Date   TSH 0.95 09/26/2019   TSH 0.01 (A) 04/26/2019   TSH 0.01 (A) 04/26/2019   FREET4 0.7 (L) 09/26/2019      Assessment & Plan:   1. Hyperthyroidism-controlled.  -Based on her previsit labs, she is adequately treated with methimazole.  Last exposure approximately 10 days ago.   Given her history of alcoholism, long-term methimazole  therapy is  unsafe for her. -She is approached for thyroid uptake and scan.  If her scan  confirms  primary hyperthyroidism persisting, she will be approached for I-131 thyroid ablation.  -She has elevated transaminases likely related to alcohol abuse which needs close follow-up.   I did not initiate any new prescriptions today. - I advised her  to maintain close follow up with Nicole Percy, MD for primary care needs.  Time for this visit: 15 minutes. Nicole Mast Attig  participated in the discussions, expressed understanding, and voiced agreement with the above plans.  All questions were answered to her satisfaction. she is encouraged to contact clinic should she have any questions or concerns prior to her return visit.   Follow up plan: Return in about 10 days (around 10/18/2019) for Follow up with Thyroid Uptake and Scan.   Glade Lloyd, MD [facility] [address] [place],  [address] Phone: [phone]  Fax: [phone]     10/08/2019, 11:55 AM  This note was partially dictated with voice recognition software. Similar sounding words can be transcribed inadequately or may not  be corrected upon review.

## 2019-10-11 ENCOUNTER — Telehealth: Payer: Self-pay

## 2019-10-11 NOTE — Telephone Encounter (Signed)
Pt notified by VM  to be NPO 4 hrs prior to appt. To take 4 hrs before her appt or after the appt.

## 2019-10-11 NOTE — Telephone Encounter (Signed)
Pt is having scan and wants to know if she can take her vitamins and valium at 6am that day.

## 2019-10-15 ENCOUNTER — Other Ambulatory Visit: Payer: Self-pay

## 2019-10-15 ENCOUNTER — Encounter (HOSPITAL_COMMUNITY)
Admission: RE | Admit: 2019-10-15 | Discharge: 2019-10-15 | Disposition: A | Discharge status: Home / Self Care | Payer: PRIVATE HEALTH INSURANCE | Source: Ambulatory Visit | Attending: "Endocrinology | Admitting: "Endocrinology

## 2019-10-15 ENCOUNTER — Encounter (HOSPITAL_COMMUNITY): Payer: PRIVATE HEALTH INSURANCE

## 2019-10-15 DIAGNOSIS — E0590 Thyrotoxicosis, unspecified without thyrotoxic crisis or storm: Secondary | ICD-10-CM | POA: Insufficient documentation

## 2019-10-16 ENCOUNTER — Encounter (HOSPITAL_COMMUNITY): Payer: PRIVATE HEALTH INSURANCE

## 2019-10-18 ENCOUNTER — Ambulatory Visit: Payer: No Typology Code available for payment source | Admitting: "Endocrinology

## 2019-10-22 ENCOUNTER — Encounter (HOSPITAL_COMMUNITY): Payer: Self-pay

## 2019-10-22 ENCOUNTER — Encounter (HOSPITAL_COMMUNITY)
Admission: RE | Admit: 2019-10-22 | Discharge: 2019-10-22 | Disposition: A | Discharge status: Home / Self Care | Payer: PRIVATE HEALTH INSURANCE | Source: Ambulatory Visit | Attending: "Endocrinology | Admitting: "Endocrinology

## 2019-10-22 ENCOUNTER — Other Ambulatory Visit: Payer: Self-pay

## 2019-10-22 DIAGNOSIS — E0590 Thyrotoxicosis, unspecified without thyrotoxic crisis or storm: Secondary | ICD-10-CM | POA: Diagnosis not present

## 2019-10-22 MED ORDER — SODIUM IODIDE I-123 7.4 MBQ CAPS
312.0000 | ORAL_CAPSULE | Freq: Once | ORAL | Status: AC
  Administered 2019-10-22: 312 via ORAL

## 2019-10-23 ENCOUNTER — Encounter (HOSPITAL_COMMUNITY)
Admission: RE | Admit: 2019-10-23 | Discharge: 2019-10-23 | Disposition: A | Discharge status: Home / Self Care | Payer: PRIVATE HEALTH INSURANCE | Source: Ambulatory Visit | Attending: "Endocrinology | Admitting: "Endocrinology

## 2019-10-24 ENCOUNTER — Ambulatory Visit (INDEPENDENT_AMBULATORY_CARE_PROVIDER_SITE_OTHER): Payer: PRIVATE HEALTH INSURANCE | Admitting: "Endocrinology

## 2019-10-24 ENCOUNTER — Other Ambulatory Visit: Payer: Self-pay

## 2019-10-24 ENCOUNTER — Encounter: Payer: Self-pay | Admitting: "Endocrinology

## 2019-10-24 DIAGNOSIS — E0590 Thyrotoxicosis, unspecified without thyrotoxic crisis or storm: Secondary | ICD-10-CM

## 2019-10-24 NOTE — Progress Notes (Signed)
10/24/2019, 9:03 AM                                Endocrinology Telehealth Visit Follow up Note -During COVID -19 Pandemic  I connected with Nicole Ewing on 10/24/2019   by telephone and verified that I am speaking with the correct person using two identifiers. Nicole Ewing, [birth year]. she has verbally consented to this visit. All issues noted in this document were discussed and addressed. The format was not optimal for physical exam.   Subjective:    Patient ID: Nicole Ewing, female    DOB: 1961/11/06, PCP Rory Percy, MD   Past Medical History:  Diagnosis Date  . Acid reflux   . Anxiety   . Cervical cancer ([place])   . COPD (chronic obstructive pulmonary disease) ([place])   . H/O: hysterectomy    Cervical dysplasia  . Menopause ovarian failure   . Migraines    Past Surgical History:  Procedure Laterality Date  . ABDOMINAL HYSTERECTOMY    . COLONOSCOPY     [place]  . TONSILLECTOMY    . TOTAL HIP ARTHROPLASTY Left 11/16/2018   Social History   Socioeconomic History  . Marital status: Married    Spouse name: Not on file  . Number of children: Not on file  . Years of education: Not on file  . Highest education level: Not on file  Occupational History  . Not on file  Social Needs  . Financial resource strain: Not on file  . Food insecurity    Worry: Not on file    Inability: Not on file  . Transportation needs    Medical: Not on file    Non-medical: Not on file  Tobacco Use  . Smoking status: Current Every Day Smoker    Types: Cigars  . Smokeless tobacco: Former Systems developer  . Tobacco comment: 1-2 cigars daily  Substance and Sexual Activity  . Alcohol use: Yes    Comment: 4-6 beers daily  . Drug use: Not Currently  . Sexual activity: Not on file  Lifestyle  . Physical activity    Days per week: Not on file    Minutes per session: Not on file  . Stress: Not on file  Relationships  . Social Herbalist on phone: Not on file    Gets together: Not on file    Attends religious service: Not on file    Active member of club or organization: Not on file    Attends meetings of clubs or organizations: Not on file    Relationship status: Not on file  Other Topics Concern  . Not on file  Social History Narrative  . Not on file   Family History  Problem Relation Age of Onset  . Melanoma Father   . Graves' disease Child   . Colon polyps Brother   . Colon cancer Neg Hx    Outpatient Encounter Medications as of 10/24/2019  Medication Sig  . acetaminophen (TYLENOL) 650 MG CR tablet Take 650 mg by mouth every 8 (eight) hours as needed for pain.  Marland Kitchen aspirin EC 81 MG tablet Take  81 mg by mouth daily.  . Calcium Carbonate-Vitamin D (CALCIUM 600+D PO) Take by mouth daily.  . diazepam (VALIUM) 5 MG tablet Take 5 mg by mouth every 6 (six) hours as needed for anxiety.  Marland Kitchen HYDROcodone-acetaminophen (NORCO/VICODIN) 5-325 MG tablet Take 1 tablet by mouth every 8 (eight) hours as needed.  . methocarbamol (ROBAXIN) 500 MG tablet Take 500 mg by mouth 4 (four) times daily.  . Multiple Vitamin (MULTIVITAMIN WITH MINERALS) TABS tablet Take 1 tablet by mouth daily.  Marland Kitchen omeprazole (PRILOSEC) 20 MG capsule Take 20 mg by mouth daily.  . pregabalin (LYRICA) 100 MG capsule Take 100 mg by mouth 2 (two) times daily.   . vitamin B-12 (CYANOCOBALAMIN) 250 MCG tablet Take 250 mcg by mouth daily.   No facility-administered encounter medications on file as of 10/24/2019.    ALLERGIES: Allergies  Allergen Reactions  . Tramadol   . Codeine Rash  . Keflex [Cephalexin] Rash    VACCINATION STATUS:  There is no immunization history on file for this patient.  HPI Nicole Ewing is 58 y.o. female who is being engaged in telehealth via telephone after she was seen in consultation for hyperthyroidism. PMD: Rory Percy, MD. She was diagnosed with hyperthyroidism in May 2020 when she was initiated on methimazole 10  mg p.o. daily. -After her thyroid function tests were reviewed on September 26, 2019, she was advised to discontinue methimazole. -Her thyroid uptake and scan on October 23, 2019 showed 25.6% 24-hour uptake.  The thyroid was reported to be inhomogeneous with relative increased tracer accumulation right superior and left inferior lobes and relative decreased uptake at the right inferior pole suggesting presence of at least one thyroid nodule and ultrasound of the thyroid was suggested. Currently, she denies palpitations, heat intolerance, tremors.  She does have family history of thyroid dysfunction in her daughter.    She denies dysphagia, shortness of breath, nor voice change. Patient is a poor historian, accompanied by her grown daughter who has history of hyperthyroidism which required I-131 treatment.   She admits to upto 6 beers daily.  Review of Systems Limited as above.  Objective:    There were no vitals taken for this visit.  Wt Readings from Last 3 Encounters:  09/06/19 145 lb 3.2 oz (65.9 kg)  08/15/19 142 lb (64.4 kg)  06/05/19 144 lb 12.8 oz (65.7 kg)    Physical Exam  CMP ( most recent) CMP     Component Value Date/Time   NA 142 06/05/2019 1446   K 3.9 06/05/2019 1446   CL 107 06/05/2019 1446   CO2 24 06/05/2019 1446   GLUCOSE 110 06/05/2019 1446   BUN 5 (L) 06/05/2019 1446   BUN 7 04/26/2019   CREATININE 0.43 (L) 06/05/2019 1446   CALCIUM 9.8 06/05/2019 1446   PROT 6.6 08/31/2019 1124   AST 24 08/31/2019 1124   ALT 16 08/31/2019 1124   BILITOT 0.2 08/31/2019 1124   GFRNONAA 112 06/05/2019 1446   GFRAA 130 06/05/2019 1446    Lipid Panel     Component Value Date/Time   CHOL 188 04/26/2019   TRIG 92 04/26/2019   HDL 68 04/26/2019   LDLCALC 102 04/26/2019      Lab Results  Component Value Date   TSH 0.95 09/26/2019   TSH 0.01 (A) 04/26/2019   TSH 0.01 (A) 04/26/2019   FREET4 0.7 (L) 09/26/2019      Assessment & Plan:   1.  Hyperthyroidism-controlled.  -Based on her previsit  labs, she is adequately treated with methimazole.  Her thyroid uptake and scan is not showing significant activity.   She is approached  for a plan to stay off of methimazole for now and repeat thyroid function tests in 4 months.  Given her history of alcoholism, long-term methimazole  therapy is  unsafe for her. -If she has relapse of hyperthyroidism, she will be considered for I-131 thyroid ablation. -Due to scan findings suggestive of thyroid nodules, she is offered baseline thyroid ultrasound which she will do anytime.  -She has elevated transaminases likely related to alcohol abuse which needs close follow-up.  Her transaminases on August 31, 2019 were much better both from June 2020.   I did not initiate any new prescriptions today. - I advised her  to maintain close follow up with Rory Percy, MD for primary care needs.   Time for this visit: 15 minutes. Nicole Mast Zeman  participated in the discussions, expressed understanding, and voiced agreement with the above plans.  All questions were answered to her satisfaction. she is encouraged to contact clinic should she have any questions or concerns prior to her return visit.   Follow up plan: Return in about 4 months (around 02/21/2020), or u/s anytime, for Follow up with Pre-visit Labs, Thyroid / Neck Ultrasound.   Glade Lloyd, MD [facility] [address] [place],  [address] Phone: [phone]  Fax: [phone]     10/24/2019, 9:03 AM  This note was partially dictated with voice recognition software. Similar sounding words can be transcribed inadequately or may not  be corrected upon review.

## 2019-10-29 ENCOUNTER — Ambulatory Visit: Payer: Self-pay | Admitting: "Endocrinology

## 2019-10-31 ENCOUNTER — Ambulatory Visit (HOSPITAL_COMMUNITY)
Admission: RE | Admit: 2019-10-31 | Discharge: 2019-10-31 | Disposition: A | Discharge status: Home / Self Care | Payer: PRIVATE HEALTH INSURANCE | Source: Ambulatory Visit | Attending: "Endocrinology | Admitting: "Endocrinology

## 2019-10-31 ENCOUNTER — Other Ambulatory Visit: Payer: Self-pay

## 2019-10-31 DIAGNOSIS — E0590 Thyrotoxicosis, unspecified without thyrotoxic crisis or storm: Secondary | ICD-10-CM | POA: Insufficient documentation

## 2019-11-05 ENCOUNTER — Telehealth: Payer: Self-pay | Admitting: "Endocrinology

## 2019-11-05 DIAGNOSIS — E049 Nontoxic goiter, unspecified: Secondary | ICD-10-CM

## 2019-11-05 DIAGNOSIS — E0590 Thyrotoxicosis, unspecified without thyrotoxic crisis or storm: Secondary | ICD-10-CM

## 2019-11-05 NOTE — Telephone Encounter (Signed)
I reviewed the thyroid ultrasound. She will need FNA. Kim, Would you arrange for FNA of 2cm left lobe nodule as soon as possible and we will have her on schedule to discuss biopsy results.

## 2019-11-05 NOTE — Telephone Encounter (Signed)
FNA ordered. Pt notified and she agrees. Order sent to central scheduling.

## 2019-11-05 NOTE — Telephone Encounter (Signed)
Pt would like to know if you could review her ultrasound results

## 2019-11-07 NOTE — Progress Notes (Signed)
Referring Provider: Rory Percy, MD Primary Care Physician:  Rory Percy, MD Primary GI Physician: Dr. Gala Romney  Chief Complaint  Patient presents with  . Elevated Hepatic Enzymes    HPI:   Nicole Ewing is a 58 y.o. female with history significant for  GERD, elevated LFTs, postprandial diarrhea, and history of intranasal drug use in her 56s.  She presents today for follow-up of elevated liver enzymes.   Last office visit on 09/06/2019 for elevated liver enzymes as well. At that time, prior work-up had included ultrasound on 05/08/2019 with heterogenous hepatic parenchymal pattern suggestive of fatty liver. Completed 06/05/2019 including CMP which found mild transaminitis with AST/ALT 38/40, mild elevation of alk phos at 159.  Iron mildly low at 38, ferritin normal at 67.  Hepatitis B serologies negative indicating no immunity, hepatitis C antibody positive but hepatitis C RNA undetectable indicating past/resolved infection.  INR normal at 0.9.  Repeat labs completed 08/31/2019 show normalization of AST/ALT. Alk phos stable/elevated at 158 (ULN 153). Bilirubin (both direct and indirect) remains normal.  At the time of office visit, patient reported she was doing okay overall.  Had not abstain from alcohol like recommended and was drinking 2-6 beers a day.  Asking about timing of next colonoscopy.  Reported prior colonoscopy at [facility].  Plans at that time were to check GGT, ANA, antimitochondrial antibodies, smooth muscle antibodies, receive hepatitis B vaccine from PCP, request TCS records from [facility] and follow-up in 2 months.  Labs on 09/26/2019 with GGT normal, ANA positive, AMA elevated at 173 (greater than 25 is positive), ANA titer with elevated antibody, cytoplasmic pattern (comment stating other reactivities such as antimitochondrial antibodies may be responsible for the pattern of fluorescence).  ASMA negative.  Patient was advised that she needed MRI for further evaluation  of her bile ducts but patient requested to complete this at a later date.  Reviewed TCS records from [place]. TCS dated 04/06/2007. Normal rectum, no inflammation, no colonic or rectal polyps, tortuous and redundant colon. Recommendations to repeat TCS in 5 years (2013).   Patient is scheduled for FNA biopsy of 2 cm left lobe thyroid nodule on 11/14/19.  Today: She is doing ok. Worried about upcoming thyroid biopsy. Stressed about her health and holidays coming up. No GI concerns at this time. No abdominal pain. She has received 2 Hep B vaccinations. Has last dose April 1st. No swelling in abdomen or lower legs. Still drinking alcohol. Some days she doesn't drink any. Other days she drinks 2 beer.  No confusion, yellowing of skin, or dark urine. No bruising or bleeding. No nausea, vomiting. Reflux symptoms are well controlled on omeprazole. No unintentional weight loss. Typically 1-2 BMs in the morning. Stools are mushy to watery. Not constipation. Stopped eating ice cream. Uses Milk in her coffee. Cheese on pizza.  Cheese on lasagna once a week.  She and her husband go get lasagna together once a week and she desires to continue this.  No blood in the stool. No black stools.   Denies fever, chills, lightheadedness, dizziness, or feeling like she will pass out.  Admits to hot flashes at times.  Denies chest pain.  Occasional heart palpitations with anxiety.  Denies shortness of breath or cough.  Denies depression but admits to a lot of anxiety.  Currently out of Valium.  She was advised to call her primary care to follow-up on her anxiety.   Past Medical History:  Diagnosis Date  . Acid reflux   .  Anxiety   . Cervical cancer ([place])   . COPD (chronic obstructive pulmonary disease) ([place])   . H/O: hysterectomy    Cervical dysplasia  . Menopause ovarian failure   . Migraines     Past Surgical History:  Procedure Laterality Date  . ABDOMINAL HYSTERECTOMY    . COLONOSCOPY  04/06/2007   [facility]:  Normal rectum, no inflammation, no colonic or rectal polyps, tortuous and redundant colon. Recommendations to repeat TCS in 5 years (2013).   . TONSILLECTOMY    . TOTAL HIP ARTHROPLASTY Left 11/16/2018  . TOTAL HIP ARTHROPLASTY Right 07/02/2019    Current Outpatient Medications  Medication Sig Dispense Refill  . acetaminophen (TYLENOL) 650 MG CR tablet Take 650 mg by mouth every 8 (eight) hours as needed for pain.    . Calcium Carbonate-Vitamin D (CALCIUM 600+D PO) Take by mouth daily.    Marland Kitchen HYDROcodone-acetaminophen (NORCO/VICODIN) 5-325 MG tablet Take 1 tablet by mouth every 8 (eight) hours as needed.    . Multiple Vitamin (MULTIVITAMIN WITH MINERALS) TABS tablet Take 1 tablet by mouth daily.    Marland Kitchen omeprazole (PRILOSEC) 20 MG capsule Take 20 mg by mouth daily.    . vitamin B-12 (CYANOCOBALAMIN) 250 MCG tablet Take 250 mcg by mouth daily.     No current facility-administered medications for this visit.     Allergies as of 11/08/2019 - Review Complete 11/08/2019  Allergen Reaction Noted  . Tramadol  06/05/2019  . Codeine Rash 01/27/2017  . Keflex [cephalexin] Rash 01/27/2017    Family History  Problem Relation Age of Onset  . Melanoma Father   . Graves' disease Child   . Colon polyps Brother   . Colon cancer Neg Hx     Social History   Socioeconomic History  . Marital status: Married    Spouse name: Not on file  . Number of children: Not on file  . Years of education: Not on file  . Highest education level: Not on file  Occupational History  . Not on file  Social Needs  . Financial resource strain: Not on file  . Food insecurity    Worry: Not on file    Inability: Not on file  . Transportation needs    Medical: Not on file    Non-medical: Not on file  Tobacco Use  . Smoking status: Current Every Day Smoker    Types: Cigars  . Smokeless tobacco: Former Systems developer  . Tobacco comment: 1-2 cigars daily  Substance and Sexual Activity  . Alcohol use: Yes     Comment: 4-6 beers daily (decresed to 2 a day or less)  . Drug use: Not Currently  . Sexual activity: Not on file  Lifestyle  . Physical activity    Days per week: Not on file    Minutes per session: Not on file  . Stress: Not on file  Relationships  . Social Herbalist on phone: Not on file    Gets together: Not on file    Attends religious service: Not on file    Active member of club or organization: Not on file    Attends meetings of clubs or organizations: Not on file    Relationship status: Not on file  Other Topics Concern  . Not on file  Social History Narrative  . Not on file    Review of Systems: Gen: See HPI CV: See HPI Resp: See HPI GI: See HPI Derm: Denies rash Psych: Denies depression.  Admits to anxiety and stress.  Heme: Denies bruising, bleeding  Physical Exam: BP 127/82   Pulse 82   Temp (!) 96.6 F (35.9 C) (Temporal)   Ht _0  (1.651 m)   Wt 141 lb (64 kg)   BMI 23.46 kg/m  General:   Alert and oriented. No distress noted. Pleasant and cooperative.  Head:  Normocephalic and atraumatic. Eyes:  Conjuctiva clear without scleral icterus. Heart:  S1, S2 present without murmurs appreciated. Lungs:  Clear to auscultation bilaterally. No wheezes, rales, or rhonchi. No distress.  Abdomen:  +BS, soft, non-tender and non-distended. No rebound or guarding. No HSM or masses noted. Msk:  Symmetrical without gross deformities. Normal posture. Extremities:  Without edema. Neurologic:  Alert and  oriented x4 Psych:  Normal mood and affect.

## 2019-11-08 ENCOUNTER — Encounter: Payer: Self-pay | Admitting: Gastroenterology

## 2019-11-08 ENCOUNTER — Ambulatory Visit: Payer: No Typology Code available for payment source | Admitting: Nurse Practitioner

## 2019-11-08 ENCOUNTER — Telehealth: Payer: Self-pay | Admitting: *Deleted

## 2019-11-08 ENCOUNTER — Other Ambulatory Visit: Payer: Self-pay | Admitting: *Deleted

## 2019-11-08 ENCOUNTER — Other Ambulatory Visit: Payer: Self-pay

## 2019-11-08 ENCOUNTER — Ambulatory Visit (INDEPENDENT_AMBULATORY_CARE_PROVIDER_SITE_OTHER): Payer: PRIVATE HEALTH INSURANCE | Admitting: Gastroenterology

## 2019-11-08 VITALS — BP 127/82 | HR 82 | Temp 96.6°F | Ht 65.0 in | Wt 141.0 lb

## 2019-11-08 DIAGNOSIS — R7989 Other specified abnormal findings of blood chemistry: Secondary | ICD-10-CM

## 2019-11-08 DIAGNOSIS — Z1211 Encounter for screening for malignant neoplasm of colon: Secondary | ICD-10-CM

## 2019-11-08 DIAGNOSIS — R197 Diarrhea, unspecified: Secondary | ICD-10-CM | POA: Diagnosis not present

## 2019-11-08 DIAGNOSIS — Z0000 Encounter for general adult medical examination without abnormal findings: Secondary | ICD-10-CM | POA: Diagnosis not present

## 2019-11-08 DIAGNOSIS — R768 Other specified abnormal immunological findings in serum: Secondary | ICD-10-CM

## 2019-11-08 DIAGNOSIS — R7689 Other specified abnormal immunological findings in serum: Secondary | ICD-10-CM | POA: Insufficient documentation

## 2019-11-08 MED ORDER — PEG 3350-KCL-NA BICARB-NACL 420 G PO SOLR: ORAL | 0 refills | Status: DC

## 2019-11-08 NOTE — Patient Instructions (Addendum)
We will get you scheduled for your colonoscopy with Dr. Buford Dresser in the near future.  Please have MRI completed.  We will help arrange this.  Please call your primary care and follow-up on your anxiety and stress.  Switch your regular milk to a lactose free milk for your coffee or use a dairy free creamer.  I would advise you continue to avoid dairy/lactose as this seems to worsen your diarrhea.  If you are going to consume dairy, such as cheese on your lasagna, take Lactaid tablets prior to your meal.  You are doing great with decreasing your alcohol intake. Please continue working on this.   We will plan to see you back after your colonoscopy.  Call if you have questions or concerns prior.  Aliene Altes, PA-C [facility]

## 2019-11-08 NOTE — Assessment & Plan Note (Signed)
Addressed under elevated LFTs. 

## 2019-11-08 NOTE — Assessment & Plan Note (Addendum)
AST/ALT normalized in September 2020.  Alk phos remained slightly elevated, but stable at 158.  Prior work-up with history of hepatitis C with undetectable hepatitis C RNA indicating past/resolved infection.  Hepatitis B serologies negative.  Patient has completed 2 of 3 vaccinations for hepatitis B with PCP.  GGT normal.  ASMA negative.  ANA positive with elevated ANA titer, AMA elevated at 173 (greater than 25 is positive).  Patient is working on decreasing her alcohol intake.  She was drinking 4-6 beers daily and is now going some days without alcohol and at most will consume 2 beer a day.  Although alk phos is minimally elevated, due to positive AMA, being middle aged and female, will pursue MRI/MRCP with and without contrast to evaluate for PBC. Further recommendations to follow. Advise she continue to work on decreasing her alcohol intake. Complete last hepatitis B vaccine on April 1 with PCP as planned. Patient will follow up in office after her upcoming colonoscopy.

## 2019-11-08 NOTE — Telephone Encounter (Signed)
Called pt. TCS scheduled for 3/4 at 12:15pm. Aware will mail prep instructions with her pre-op/covid test. Confirmed address. Rx sent in. Orders placed.  Patient aware of MRCP scheduled for 12/11 at 10:00am, arrival 9:30am, npo 4 hrs prior.  PA submitted via insurance ambetter which was then transferred to radmd for PA for imaging. PA pending clinical review. Clinicals faxed. Tracking# [id number]

## 2019-11-08 NOTE — Assessment & Plan Note (Addendum)
Diarrhea has improved since decreasing dairy intake.  She was eating ice cream nightly and having frequent loose stools throughout the day with nocturnal stools at times.  Currently only having 1-2 loose to watery BMs in the morning after drinking coffee with milk.  Denies abdominal pain, unintentional weight loss, bright red blood per rectum, or melena.  Suspect diarrhea is secondary to lactose intolerance.    She was advised to switch to a lactose-free milk or use a dairy free creamer for her coffee.  She desires to continue to have cheese on her lasagna once a week, so I advise she take Lactaid pills prior to consuming dairy/lactose. She is due for colonoscopy at this time with last TCS in 2008 at [place] [facility].  No polyps but recommendations to repeat in 5 years.  Proceed with TCS with propofol with Dr. Gala Romney in the near future. The risks, benefits, and alternatives have been discussed in detail with patient. They have stated understanding and desire to proceed.  Follow-up after colonoscopy.

## 2019-11-08 NOTE — Assessment & Plan Note (Signed)
58 year old female who is overdue for colonoscopy.  Last colonoscopy in May 2008 at [facility] with normal rectum, no inflammation, no colonic or rectal polyps.  Tortuous and redundant colon.  Recommendations to repeat in 5 years (2013).  Lower GI symptoms at this time include 1-2 watery stools in the mornings after drinking coffee with milk.  She was having frequent episodes of diarrhea earlier this year, but she has stopped eating ice cream and her diarrhea has significantly improved.  No other significant upper or lower GI symptoms.  No alarm symptoms.  No family history of colon cancer.  Reports brother has polyps.  Proceed with TCS with propofol with Dr. Gala Romney in the near future. The risks, benefits, and alternatives have been discussed in detail with patient. They have stated understanding and desire to proceed.  Propofol due to alcohol use, daily Norco, and anxiety for which she was on Valium daily. Currently out of Valium but will likely restart when she sees her PCP.  Follow-up after colonoscopy.

## 2019-11-09 ENCOUNTER — Encounter: Payer: Self-pay | Admitting: *Deleted

## 2019-11-13 NOTE — Telephone Encounter (Signed)
Per RADMD: The reference number for this request is [id number]. This request is only valid from 11/08/2019 to 03/07/2020

## 2019-11-14 ENCOUNTER — Ambulatory Visit (HOSPITAL_COMMUNITY)
Admission: RE | Admit: 2019-11-14 | Discharge: 2019-11-14 | Disposition: A | Discharge status: Home / Self Care | Payer: PRIVATE HEALTH INSURANCE | Source: Ambulatory Visit | Attending: "Endocrinology | Admitting: "Endocrinology

## 2019-11-14 ENCOUNTER — Encounter (HOSPITAL_COMMUNITY): Payer: Self-pay

## 2019-11-14 ENCOUNTER — Other Ambulatory Visit: Payer: Self-pay

## 2019-11-14 DIAGNOSIS — E0590 Thyrotoxicosis, unspecified without thyrotoxic crisis or storm: Secondary | ICD-10-CM | POA: Diagnosis not present

## 2019-11-14 DIAGNOSIS — E049 Nontoxic goiter, unspecified: Secondary | ICD-10-CM

## 2019-11-14 DIAGNOSIS — E041 Nontoxic single thyroid nodule: Secondary | ICD-10-CM | POA: Insufficient documentation

## 2019-11-14 MED ORDER — LIDOCAINE HCL (PF) 2 % IJ SOLN
INTRAMUSCULAR | Status: AC
  Filled 2019-11-14: qty 10

## 2019-11-15 LAB — CYTOLOGY - NON PAP

## 2019-11-16 ENCOUNTER — Other Ambulatory Visit: Payer: Self-pay | Admitting: Gastroenterology

## 2019-11-16 ENCOUNTER — Other Ambulatory Visit: Payer: Self-pay

## 2019-11-16 ENCOUNTER — Ambulatory Visit (HOSPITAL_COMMUNITY)
Admission: RE | Admit: 2019-11-16 | Discharge: 2019-11-16 | Disposition: A | Discharge status: Home / Self Care | Payer: PRIVATE HEALTH INSURANCE | Source: Ambulatory Visit | Attending: Gastroenterology | Admitting: Gastroenterology

## 2019-11-16 DIAGNOSIS — R768 Other specified abnormal immunological findings in serum: Secondary | ICD-10-CM | POA: Insufficient documentation

## 2019-11-16 DIAGNOSIS — R7989 Other specified abnormal findings of blood chemistry: Secondary | ICD-10-CM | POA: Diagnosis not present

## 2019-11-16 MED ORDER — GADOBUTROL 1 MMOL/ML IV SOLN
7.0000 mL | Freq: Once | INTRAVENOUS | Status: AC | PRN
  Administered 2019-11-16: 7 mL via INTRAVENOUS

## 2019-11-19 ENCOUNTER — Other Ambulatory Visit: Payer: Self-pay | Admitting: *Deleted

## 2019-11-19 ENCOUNTER — Other Ambulatory Visit: Payer: Self-pay

## 2019-11-19 ENCOUNTER — Telehealth: Payer: Self-pay

## 2019-11-19 DIAGNOSIS — R7989 Other specified abnormal findings of blood chemistry: Secondary | ICD-10-CM

## 2019-11-19 DIAGNOSIS — R590 Localized enlarged lymph nodes: Secondary | ICD-10-CM

## 2019-11-19 NOTE — Telephone Encounter (Signed)
Noted.  See result note.  

## 2019-11-19 NOTE — Telephone Encounter (Signed)
noted 

## 2019-11-19 NOTE — Telephone Encounter (Signed)
Received a call from [facility] with [facility]. Please see procedure impression in pts chart. Impression, 1. Mild retroperitoneal and mesenteric lymphadenopathy in the abdomen. Lymphoma or metastatic disease could have this appearance. Dedicated CT scan of the abdomen and pelvis with IV contrast recommended to further evaluate. 2. No intra or extrahepatic biliary duct dilatation.

## 2019-11-19 NOTE — Progress Notes (Signed)
Liver looks good. There is a small cyst in her liver, but doesn't look concerning. Likely an incidental finding. Bile ducts looks good (this is what we were looking at due to a couple of her autoimmune markers being elevated). We will continue to monitor her LFTs when she returns for follow-up after her colonoscopy. Continue to work towards alcohol cessation.   She does have other significant findings of mild retroperitoneal and mesenteric lymphadenopathy in the abdomen.  CT impression does state lymphoma or metastatic disease could have this appearance.  However, this is not certain and we need to pursue CT abdomen and pelvis with IV contrast to evaluate this further.   Alicia: Please let patient know results and recommendations. She will need updated creatinine for her CT.  [facility] Clinical Pool: If patient is agreeable, please arrange CT abdomen and pelvis with IV contrast to evaluate retroperitoneal and mesenteric lymphadenopathy.

## 2019-11-20 ENCOUNTER — Telehealth: Payer: Self-pay | Admitting: "Endocrinology

## 2019-11-20 NOTE — Telephone Encounter (Signed)
No cancer. Additional discussion during her visit.

## 2019-11-20 NOTE — Telephone Encounter (Signed)
Pt notified of results

## 2019-11-20 NOTE — Telephone Encounter (Signed)
Pt called to see if you could review her biopsy results and let her know.

## 2019-11-21 ENCOUNTER — Telehealth: Payer: Self-pay | Admitting: *Deleted

## 2019-11-21 NOTE — Telephone Encounter (Signed)
PA submited for clinical review via [facility]. Tracking # [id number]. Clinicals faxed.

## 2019-11-23 NOTE — Telephone Encounter (Signed)
Received fax and PA has been approved. Auth# [id number] dates 11/21/2019-03/20/2020

## 2019-12-04 ENCOUNTER — Other Ambulatory Visit (HOSPITAL_COMMUNITY)
Admission: RE | Admit: 2019-12-04 | Discharge: 2019-12-04 | Disposition: A | Discharge status: Home / Self Care | Payer: PRIVATE HEALTH INSURANCE | Source: Ambulatory Visit | Attending: Gastroenterology | Admitting: Gastroenterology

## 2019-12-04 ENCOUNTER — Other Ambulatory Visit: Payer: Self-pay

## 2019-12-04 DIAGNOSIS — R7989 Other specified abnormal findings of blood chemistry: Secondary | ICD-10-CM | POA: Diagnosis present

## 2019-12-04 LAB — CREATININE, SERUM
Creatinine, Ser: 0.57 mg/dL (ref 0.44–1.00)
GFR calc Af Amer: >60 mL/min (ref >60)
GFR calc non Af Amer: >60 mL/min (ref >60)

## 2019-12-06 ENCOUNTER — Ambulatory Visit (HOSPITAL_COMMUNITY)
Admission: RE | Admit: 2019-12-06 | Discharge: 2019-12-06 | Disposition: A | Discharge status: Home / Self Care | Payer: PRIVATE HEALTH INSURANCE | Source: Ambulatory Visit | Attending: Gastroenterology | Admitting: Gastroenterology

## 2019-12-06 ENCOUNTER — Other Ambulatory Visit: Payer: Self-pay

## 2019-12-06 DIAGNOSIS — R590 Localized enlarged lymph nodes: Secondary | ICD-10-CM | POA: Diagnosis not present

## 2019-12-06 MED ORDER — IOHEXOL 300 MG/ML  SOLN
100.0000 mL | Freq: Once | INTRAMUSCULAR | Status: AC | PRN
  Administered 2019-12-06: 100 mL via INTRAVENOUS

## 2019-12-10 ENCOUNTER — Telehealth: Payer: Self-pay | Admitting: Internal Medicine

## 2019-12-10 ENCOUNTER — Other Ambulatory Visit: Payer: Self-pay

## 2019-12-10 DIAGNOSIS — N3289 Other specified disorders of bladder: Secondary | ICD-10-CM

## 2019-12-10 NOTE — Telephone Encounter (Signed)
Results given to pt. See result note.

## 2019-12-10 NOTE — Telephone Encounter (Signed)
Pt asked if someone could call her to explain her CT results. [phone]

## 2019-12-19 ENCOUNTER — Telehealth: Payer: Self-pay | Admitting: Internal Medicine

## 2019-12-19 ENCOUNTER — Other Ambulatory Visit: Payer: Self-pay | Admitting: *Deleted

## 2019-12-19 NOTE — Telephone Encounter (Signed)
PA done via availity. PA pending. Refer# [id number]

## 2019-12-19 NOTE — Telephone Encounter (Signed)
[phone] PATIENT CALLED AND HER INSURANCE IS NOW [facility].  HER ID NUMBER IS [id number] AND THEIR PROVIDER NUMBER IS [phone]

## 2019-12-19 NOTE — Telephone Encounter (Signed)
PA was approved. Fax scanned into chart

## 2020-01-22 ENCOUNTER — Encounter: Payer: Self-pay | Admitting: Urology

## 2020-01-22 ENCOUNTER — Ambulatory Visit (INDEPENDENT_AMBULATORY_CARE_PROVIDER_SITE_OTHER): Payer: 59 | Admitting: Urology

## 2020-01-22 ENCOUNTER — Other Ambulatory Visit: Payer: Self-pay

## 2020-01-22 VITALS — BP 155/97 | HR 94 | Temp 97.7°F | Ht 65.0 in | Wt 138.0 lb

## 2020-01-22 DIAGNOSIS — N3289 Other specified disorders of bladder: Secondary | ICD-10-CM

## 2020-01-22 LAB — POCT URINALYSIS DIPSTICK
Bilirubin, UA: NEGATIVE
Blood, UA: NEGATIVE
Glucose, UA: NEGATIVE
Ketones, UA: NEGATIVE
Leukocytes, UA: NEGATIVE
Nitrite, UA: NEGATIVE
Protein, UA: NEGATIVE
Spec Grav, UA: <=1.005 — AB (ref 1.010–1.025)
Urobilinogen, UA: NEGATIVE E.U./dL — AB
pH, UA: 6.5 (ref 5.0–8.0)

## 2020-01-22 NOTE — Progress Notes (Signed)
H&P  Chief Complaint: Abnormal finding on diagnostic imaging (bladder)  History of Present Illness: Nicole Ewing is a 59 y.o. year old female   2.16.2021: This patient presents today having had a recent CT for following retroperitoneal adenopathy (12.31.2020) that incidentally found thickening of the anterior bladder wall (13 mm region). It was recommended that she have an outpatient urological consult to discuss this finding. She does have a hx of thyroid issues -- had a recent bx for goiter that is pending follow-up. She is a smoker (has smoked for 40 yrs but did have an 8 yr break, currently 1-2 cigarettes a day/1 pack every 1-2 weeks). She denies any blood per urine or issues with recurrent cystitis. She denies any issues with urinary freq/urgency or weak stream, but does have some occasional stress incontinence that she attributes to her hip operation.  (below copied from radiologist report RE: CT for following adenopathy):  IMPRESSION: 1. There is a focal area of thickening involving the anterior bladder wall measuring approximately 1.3 cm in length. While this could represent a focal area of bladder wall thickening, an underlying mass is not excluded. Outpatient urology consultation is recommended. 2. Persistent retroperitoneal adenopathy of unknown clinical significance.  Past Medical History:  Diagnosis Date  . Acid reflux   . Anxiety   . Arthritis   . Cervical cancer ([place])   . COPD (chronic obstructive pulmonary disease) ([place])   . Depression   . H/O: hysterectomy    Cervical dysplasia  . High cholesterol   . Hyperthyroidism   . Menopause ovarian failure   . Migraines     Past Surgical History:  Procedure Laterality Date  . ABDOMINAL HYSTERECTOMY    . COLONOSCOPY  04/06/2007   [facility]:  Normal rectum, no inflammation, no colonic or rectal polyps, tortuous and redundant colon. Recommendations to repeat TCS in 5 years (2013).   . TONSILLECTOMY    . TOTAL  HIP ARTHROPLASTY Left 11/16/2018  . TOTAL HIP ARTHROPLASTY Right 07/02/2019    Home Medications:  (Not in a hospital admission)   Allergies:  Allergies  Allergen Reactions  . Tramadol   . Codeine Rash  . Keflex [Cephalexin] Rash    Family History  Problem Relation Age of Onset  . Melanoma Father   . Graves' disease Child   . Colon polyps Brother   . Colon cancer Neg Hx     Social History:  reports that she has been smoking cigars and cigarettes. She has a 6.25 pack-year smoking history. She has quit using smokeless tobacco. She reports current alcohol use. She reports previous drug use.  ROS: A complete review of systems was performed.  All systems are negative except for pertinent findings as noted.  Physical Exam:  Vital signs in last 24 hours: Temp:  [97.7 F (36.5 C)] 97.7 F (36.5 C) (02/16 1009) Pulse Rate:  [94] 94 (02/16 1009) BP: (155)/(97) 155/97 (02/16 1009) Weight:  [138 lb (62.6 kg)] 138 lb (62.6 kg) (02/16 1009) General:  Alert and oriented, No acute distress HEENT: Normocephalic, atraumatic Neck: No JVD or lymphadenopathy Cardiovascular: Regular rate and rhythm Lungs: Clear bilaterally Abdomen: Soft, nontender, nondistended, no abdominal masses Back: No CVA tenderness Extremities: No edema Neurologic: Grossly intact  Laboratory Data:  Results for orders placed or performed in visit on 01/22/20 (from the past 24 hour(s))  POCT urinalysis dipstick     Status: Abnormal   Collection Time: 01/22/20 10:18 AM  Result Value Ref Range   Color,  UA yellow    Clarity, UA clear    Glucose, UA Negative Negative   Bilirubin, UA neg    Ketones, UA neg    Spec Grav, UA <=1.005 (A) 1.010 - 1.025   Blood, UA neg    pH, UA 6.5 5.0 - 8.0   Protein, UA Negative Negative   Urobilinogen, UA negative (A) 0.2 or 1.0 E.U./dL   Nitrite, UA neg    Leukocytes, UA Negative Negative   Appearance clear    Odor     No results found for this or any previous visit (from  the past 240 hour(s)). Creatinine: No results for input(s): CREATININE in the last 168 hours.  I have reviewed prior pt notes  I have reviewed notes from referring/previous physicians  I have reviewed urinalysis results  I have independently reviewed prior imaging   Impression/Assessment:  UA today is clear of blood. She is a smoker and has been since she was 44 -- she was advised to try to reduce and eventually cease smoking. I will want to see her back for cystoscopy. She does express some hesitancy about this procedure but we discussed it in more depth and she seems less worried -- she does have valium that she is fine to take if she has someone to drive her to and from the visit.   Plan:  1. Return for OV w/ cysto in ~ 62f weeks.   2. Pending results, will plan appropriate follow-up. If this is normal, she will likely just have a repeat CT for follow-up of RP adenopathy  3. Advised to reduce and eventually cease smoking.  Dena Billet 01/22/2020, 10:44 AM  Lillette Boxer. Mekiyah Gladwell MD

## 2020-01-22 NOTE — Progress Notes (Signed)
Urological Symptom Review  Patient is experiencing the following symptoms: Leakage of urine   Review of Systems  Gastrointestinal (upper)  : Indigestion/heartburn  Gastrointestinal (lower) : Diarrhea  Constitutional : Night Sweats Weight loss  Skin: Negative for skin symptoms  Eyes: Blurred vision  Ear/Nose/Throat : Sinus problems  Hematologic/Lymphatic: Negative for Hematologic/Lymphatic symptoms  Cardiovascular : Negative for cardiovascular symptoms  Respiratory : Negative for respiratory symptoms  Endocrine: Excessive thirst  Musculoskeletal: Back pain Joint pain  Neurological: Negative for neurological symptoms  Psychologic: Anxiety

## 2020-01-24 ENCOUNTER — Encounter: Payer: Self-pay | Admitting: Urology

## 2020-01-29 ENCOUNTER — Other Ambulatory Visit: Payer: Self-pay

## 2020-01-29 ENCOUNTER — Encounter: Payer: Self-pay | Admitting: Urology

## 2020-01-29 ENCOUNTER — Ambulatory Visit (INDEPENDENT_AMBULATORY_CARE_PROVIDER_SITE_OTHER): Payer: 59 | Admitting: Urology

## 2020-01-29 VITALS — BP 132/84 | HR 98 | Temp 98.2°F | Ht 65.0 in | Wt 136.0 lb

## 2020-01-29 DIAGNOSIS — N3289 Other specified disorders of bladder: Secondary | ICD-10-CM | POA: Diagnosis not present

## 2020-01-29 LAB — POCT URINALYSIS DIPSTICK
Bilirubin, UA: NEGATIVE
Blood, UA: NEGATIVE
Glucose, UA: NEGATIVE
Ketones, UA: NEGATIVE
Leukocytes, UA: NEGATIVE
Nitrite, UA: NEGATIVE
Protein, UA: NEGATIVE
Spec Grav, UA: <=1.005 — AB (ref 1.010–1.025)
Urobilinogen, UA: 0.2 E.U./dL
pH, UA: 6 (ref 5.0–8.0)

## 2020-01-29 MED ORDER — CIPROFLOXACIN HCL 500 MG PO TABS
500.0000 mg | ORAL_TABLET | Freq: Once | ORAL | Status: AC
  Administered 2020-01-29: 10:00:00 500 mg via ORAL

## 2020-01-29 NOTE — Progress Notes (Signed)
Urological Symptom Review  Patient is experiencing the following symptoms: none   Review of Systems  Gastrointestinal (upper)  : Negative for upper GI symptoms  Gastrointestinal (lower) : Negative for lower GI symptoms  Constitutional : Night Sweats Weight loss  Skin: Negative for skin symptoms  Eyes: Negative for eye symptoms  Ear/Nose/Throat : Sinus problems  Hematologic/Lymphatic: Negative for Hematologic/Lymphatic symptoms  Cardiovascular : Negative for cardiovascular symptoms  Respiratory : Negative for respiratory symptoms  Endocrine: Excessive thirst  Musculoskeletal: Back pain Joint pain  Neurological: Negative for neurological symptoms  Psychologic: Anxiety

## 2020-01-29 NOTE — Addendum Note (Signed)
Addended by: Franchot Gallo on: 01/29/2020 09:40 AM   Modules accepted: Level of Service

## 2020-01-29 NOTE — Progress Notes (Signed)
  Subjective: Patient reports no recent changes in her urinary pattern.  She has had no blood in her urine.   Intake/Output from previous day: No intake/output data recorded. Intake/Output this shift: @IOTHISSHIFT @  Physical Exam:  Constitutional: Vital signs reviewed. WD WN in NAD   Eyes: PERRL, No scleral icterus.   Cardiovascular: RRR Pulmonary/Chest: Normal effort organomegaly, or guarding present.  Genitourinary: Normal external female genitalia Extremities: No cyanosis or edema   Lab Results: No results for input(s): HGB, HCT in the last 72 hours. BMET No results for input(s): NA, K, CL, CO2, GLUCOSE, BUN, CREATININE, CALCIUM in the last 72 hours. No results for input(s): LABPT, INR in the last 72 hours. No results for input(s): LABURIN in the last 72 hours. No results found for this or any previous visit.  Cystoscopy Procedure Note:  Indication: Abnormal bladder appearance on CT scan.  After informed consent and discussion of the procedure and its risks, Nicole Ewing was positioned and prepped in the standard fashion. Cystoscopy was performed with a flexible cystoscope. The urethra, bladder neck and entire bladder was visualized in a standard fashion.  The ureteral orifices were visualized in their normal location and orientation.  Bladder wall appeared normal without urothelial abnormalities.    Assessment/Plan:   Abnormal appearance of bladder wall on CT scan.  This was probably because the bladder was decompressed with redundancy of the urothelium.  Cystoscopy today was normal.  I see no urologic issues that need further evaluation.    I will release her from urologic care.  I recommend she follow-up with her GI and primary care provider.    [name] 01/29/2020, 9:35 AM

## 2020-02-04 ENCOUNTER — Telehealth: Payer: Self-pay

## 2020-02-04 ENCOUNTER — Telehealth: Payer: Self-pay | Admitting: Internal Medicine

## 2020-02-04 NOTE — Telephone Encounter (Signed)
Pt aware Miralax instructions placed at front desk (see other phone note).

## 2020-02-04 NOTE — Telephone Encounter (Signed)
Pt aware Miralax instructions placed at front desk.

## 2020-02-04 NOTE — Telephone Encounter (Signed)
Pt is scheduled procedure for Thursday and her prep needs to be called into [facility]. [phone]

## 2020-02-04 NOTE — Telephone Encounter (Signed)
Pt called office and LMOVM, pharmacy doesn't have her TCS prep. TCS scheduled for 02/07/20.  Miralax instructions placed at front desk for pt to pickup. She has pre-op appt tomorrow. She also has MyChart.   Tried to call pt, no answer, LMOVM for return call.

## 2020-02-05 ENCOUNTER — Other Ambulatory Visit: Payer: Self-pay

## 2020-02-05 ENCOUNTER — Encounter (HOSPITAL_COMMUNITY): Payer: Self-pay

## 2020-02-05 ENCOUNTER — Encounter (HOSPITAL_COMMUNITY)
Admission: RE | Admit: 2020-02-05 | Discharge: 2020-02-05 | Disposition: A | Discharge status: Home / Self Care | Payer: 59 | Source: Ambulatory Visit | Attending: Internal Medicine | Admitting: Internal Medicine

## 2020-02-05 ENCOUNTER — Other Ambulatory Visit (HOSPITAL_COMMUNITY)
Admission: RE | Admit: 2020-02-05 | Discharge: 2020-02-05 | Disposition: A | Discharge status: Home / Self Care | Payer: 59 | Source: Ambulatory Visit | Attending: Internal Medicine | Admitting: Internal Medicine

## 2020-02-05 DIAGNOSIS — Z20822 Contact with and (suspected) exposure to covid-19: Secondary | ICD-10-CM | POA: Diagnosis not present

## 2020-02-05 DIAGNOSIS — Z01812 Encounter for preprocedural laboratory examination: Secondary | ICD-10-CM | POA: Diagnosis present

## 2020-02-05 LAB — SARS CORONAVIRUS 2 (TAT 6-24 HRS): SARS Coronavirus 2: NEGATIVE

## 2020-02-07 ENCOUNTER — Ambulatory Visit (HOSPITAL_COMMUNITY): Payer: 59 | Admitting: Anesthesiology

## 2020-02-07 ENCOUNTER — Ambulatory Visit (HOSPITAL_COMMUNITY)
Admission: RE | Admit: 2020-02-07 | Discharge: 2020-02-07 | Disposition: A | Discharge status: Home / Self Care | Payer: 59 | Attending: Internal Medicine | Admitting: Internal Medicine

## 2020-02-07 ENCOUNTER — Encounter (HOSPITAL_COMMUNITY)
Admission: RE | Disposition: A | Discharge status: Home / Self Care | Payer: Self-pay | Source: Home / Self Care | Attending: Internal Medicine

## 2020-02-07 ENCOUNTER — Encounter (HOSPITAL_COMMUNITY): Payer: Self-pay | Admitting: Internal Medicine

## 2020-02-07 DIAGNOSIS — M1990 Unspecified osteoarthritis, unspecified site: Secondary | ICD-10-CM | POA: Insufficient documentation

## 2020-02-07 DIAGNOSIS — Z8541 Personal history of malignant neoplasm of cervix uteri: Secondary | ICD-10-CM | POA: Diagnosis not present

## 2020-02-07 DIAGNOSIS — Z808 Family history of malignant neoplasm of other organs or systems: Secondary | ICD-10-CM | POA: Insufficient documentation

## 2020-02-07 DIAGNOSIS — F329 Major depressive disorder, single episode, unspecified: Secondary | ICD-10-CM | POA: Insufficient documentation

## 2020-02-07 DIAGNOSIS — Z79899 Other long term (current) drug therapy: Secondary | ICD-10-CM | POA: Insufficient documentation

## 2020-02-07 DIAGNOSIS — J449 Chronic obstructive pulmonary disease, unspecified: Secondary | ICD-10-CM | POA: Diagnosis not present

## 2020-02-07 DIAGNOSIS — Z881 Allergy status to other antibiotic agents status: Secondary | ICD-10-CM | POA: Diagnosis not present

## 2020-02-07 DIAGNOSIS — Z888 Allergy status to other drugs, medicaments and biological substances status: Secondary | ICD-10-CM | POA: Insufficient documentation

## 2020-02-07 DIAGNOSIS — E0590 Thyrotoxicosis, unspecified without thyrotoxic crisis or storm: Secondary | ICD-10-CM | POA: Insufficient documentation

## 2020-02-07 DIAGNOSIS — F419 Anxiety disorder, unspecified: Secondary | ICD-10-CM | POA: Diagnosis not present

## 2020-02-07 DIAGNOSIS — K621 Rectal polyp: Secondary | ICD-10-CM | POA: Diagnosis not present

## 2020-02-07 DIAGNOSIS — Z96643 Presence of artificial hip joint, bilateral: Secondary | ICD-10-CM | POA: Diagnosis not present

## 2020-02-07 DIAGNOSIS — Z1211 Encounter for screening for malignant neoplasm of colon: Secondary | ICD-10-CM | POA: Diagnosis present

## 2020-02-07 DIAGNOSIS — Z8371 Family history of colonic polyps: Secondary | ICD-10-CM | POA: Diagnosis not present

## 2020-02-07 DIAGNOSIS — Z90710 Acquired absence of both cervix and uterus: Secondary | ICD-10-CM | POA: Diagnosis not present

## 2020-02-07 DIAGNOSIS — Z885 Allergy status to narcotic agent status: Secondary | ICD-10-CM | POA: Insufficient documentation

## 2020-02-07 DIAGNOSIS — E7800 Pure hypercholesterolemia, unspecified: Secondary | ICD-10-CM | POA: Diagnosis not present

## 2020-02-07 DIAGNOSIS — K219 Gastro-esophageal reflux disease without esophagitis: Secondary | ICD-10-CM | POA: Insufficient documentation

## 2020-02-07 DIAGNOSIS — F17210 Nicotine dependence, cigarettes, uncomplicated: Secondary | ICD-10-CM | POA: Diagnosis not present

## 2020-02-07 HISTORY — PX: COLONOSCOPY WITH PROPOFOL: SHX5780

## 2020-02-07 HISTORY — PX: POLYPECTOMY: SHX5525

## 2020-02-07 SURGERY — COLONOSCOPY WITH PROPOFOL
Anesthesia: General

## 2020-02-07 MED ORDER — LIDOCAINE HCL (CARDIAC) PF 100 MG/5ML IV SOSY
PREFILLED_SYRINGE | INTRAVENOUS | Status: DC | PRN
  Administered 2020-02-07: 50 mg via INTRATRACHEAL

## 2020-02-07 MED ORDER — PROPOFOL 10 MG/ML IV BOLUS
INTRAVENOUS | Status: DC | PRN
  Administered 2020-02-07 (×2): 20 mg via INTRAVENOUS

## 2020-02-07 MED ORDER — CHLORHEXIDINE GLUCONATE CLOTH 2 % EX PADS: 6.0000 | MEDICATED_PAD | Freq: Once | CUTANEOUS | Status: DC

## 2020-02-07 MED ORDER — KETAMINE HCL 50 MG/5ML IJ SOSY
PREFILLED_SYRINGE | INTRAMUSCULAR | Status: AC
  Filled 2020-02-07: qty 5

## 2020-02-07 MED ORDER — LIDOCAINE 2% (20 MG/ML) 5 ML SYRINGE
INTRAMUSCULAR | Status: AC
  Filled 2020-02-07: qty 10

## 2020-02-07 MED ORDER — LACTATED RINGERS IV SOLN: INTRAVENOUS | Status: DC | PRN

## 2020-02-07 MED ORDER — LACTATED RINGERS IV SOLN: Freq: Once | INTRAVENOUS | Status: AC

## 2020-02-07 MED ORDER — GLYCOPYRROLATE 0.2 MG/ML IJ SOLN
INTRAMUSCULAR | Status: DC | PRN
  Administered 2020-02-07: .1 mg via INTRAVENOUS

## 2020-02-07 MED ORDER — PROPOFOL 500 MG/50ML IV EMUL
INTRAVENOUS | Status: DC | PRN
  Administered 2020-02-07: 150 ug/kg/min via INTRAVENOUS

## 2020-02-07 MED ORDER — KETAMINE HCL 10 MG/ML IJ SOLN
INTRAMUSCULAR | Status: DC | PRN
  Administered 2020-02-07: 30 mg via INTRAVENOUS
  Administered 2020-02-07: 10 mg via INTRAVENOUS

## 2020-02-07 NOTE — Anesthesia Preprocedure Evaluation (Addendum)
Anesthesia Evaluation  Patient identified by MRN, date of birth, ID band Patient awake    Reviewed: Allergy & Precautions, NPO status , Patient's Chart, lab work & pertinent test results  Airway Mallampati: III  TM Distance: >3 FB Neck ROM: Full    Dental  (+) Partial Upper, Dental Advisory Given   Pulmonary COPD, Current Smoker and Patient abstained from smoking.,    Pulmonary exam normal breath sounds clear to auscultation       Cardiovascular Normal cardiovascular exam Rhythm:Regular Rate:Normal     Neuro/Psych  Headaches, PSYCHIATRIC DISORDERS Anxiety Depression    GI/Hepatic Neg liver ROS, GERD  Medicated and Controlled,  Endo/Other  Hyperthyroidism (not on medication, stopped medication as per her doctor)   Renal/GU negative Renal ROS     Musculoskeletal  (+) Arthritis ,   Abdominal   Peds  Hematology   Anesthesia Other Findings   Reproductive/Obstetrics                           Anesthesia Physical Anesthesia Plan  ASA: II  Anesthesia Plan: General   Post-op Pain Management:    Induction: Intravenous  PONV Risk Score and Plan: TIVA  Airway Management Planned: Nasal Cannula, Natural Airway and Simple Face Mask  Additional Equipment:   Intra-op Plan:   Post-operative Plan:   Informed Consent: I have reviewed the patients History and Physical, chart, labs and discussed the procedure including the risks, benefits and alternatives for the proposed anesthesia with the patient or authorized representative who has indicated his/her understanding and acceptance.     Dental advisory given  Plan Discussed with: CRNA and Surgeon  Anesthesia Plan Comments:        Anesthesia Quick Evaluation

## 2020-02-07 NOTE — Op Note (Signed)
[facility] Patient Name: Nicole Ewing Procedure Date: 02/07/2020 12:06 PM MRN: [id number] Date of Birth: June 03, 1961 Attending MD: Norvel Richards , MD CSN: [id number] Age: 59 Admit Type: Outpatient Procedure:                Colonoscopy Indications:              Colon cancer screening in patient at increased                            risk: Family history of 1st-degree relative with                            colon polyps Providers:                Norvel Richards, MD, Janeece Riggers, RN, Nelma Rothman, Technician Referring MD:              Medicines:                Propofol per Anesthesia Complications:            No immediate complications. Estimated Blood Loss:     Estimated blood loss was minimal. Procedure:                Pre-Anesthesia Assessment:                           - Prior to the procedure, a History and Physical                            was performed, and patient medications and                            allergies were reviewed. The patient's tolerance of                            previous anesthesia was also reviewed. The risks                            and benefits of the procedure and the sedation                            options and risks were discussed with the patient.                            All questions were answered, and informed consent                            was obtained. Prior Anticoagulants: The patient has                            taken no previous anticoagulant or antiplatelet                            agents.  ASA Grade Assessment: II - A patient with                            mild systemic disease. After reviewing the risks                            and benefits, the patient was deemed in                            satisfactory condition to undergo the procedure.                           After obtaining informed consent, the colonoscope                            was passed under direct vision.  Throughout the                            procedure, the patient's blood pressure, pulse, and                            oxygen saturations were monitored continuously. The                            [id number] scope was introduced through                            the anus and advanced to the the cecum, identified                            by appendiceal orifice and ileocecal valve. The                            colonoscopy was performed without difficulty. The                            patient tolerated the procedure well. The quality                            of the bowel preparation was adequate. Scope In: 12:54:31 PM Scope Out: 1:13:34 PM Scope Withdrawal Time: 0 hours 14 minutes 12 seconds  Total Procedure Duration: 0 hours 19 minutes 3 seconds  Findings:      The perianal and digital rectal examinations were normal.      A 5 mm polyp was found in the proximal rectum. The polyp was sessile.       The polyp was removed with a cold snare. Resection and retrieval were       complete. Estimated blood loss was minimal. Estimated blood loss was       minimal.      The exam was otherwise without abnormality on direct and retroflexion       views. Impression:               - One 5 mm polyp in the proximal rectum, removed  with a cold snare. Resected and retrieved.                           - The examination was otherwise normal on direct                            and retroflexion views. Moderate Sedation:      Moderate (conscious) sedation was personally administered by an       anesthesia professional. The following parameters were monitored: oxygen       saturation, heart rate, blood pressure, respiratory rate, EKG, adequacy       of pulmonary ventilation, and response to care. Recommendation:           - Patient has a contact number available for                            emergencies. The signs and symptoms of potential                             delayed complications were discussed with the                            patient. Return to normal activities tomorrow.                            Written discharge instructions were provided to the                            patient.                           - Resume previous diet.                           - Continue present medications.                           - Repeat colonoscopy date to be determined after                            pending pathology results are reviewed for                            surveillance.                           - Return to GI office in 3 months. Procedure Code(s):        --- Professional ---                           [phone], Colonoscopy, flexible; with removal of                            tumor(s), polyp(s), or other lesion(s) by snare  technique Diagnosis Code(s):        --- Professional ---                           Z83.71, Family history of colonic polyps                           K62.1, Rectal polyp CPT copyright 2019 American Medical Association. All rights reserved. The codes documented in this report are preliminary and upon coder review may  be revised to meet current compliance requirements. Cristopher Estimable. Salah Nakamura, MD Norvel Richards, MD 02/07/2020 1:21:54 PM This report has been signed electronically. Number of Addenda: 0

## 2020-02-07 NOTE — Anesthesia Postprocedure Evaluation (Signed)
Anesthesia Post Note  Patient: Nicole Ewing  Procedure(s) Performed: COLONOSCOPY WITH PROPOFOL (N/A ) POLYPECTOMY  Anesthesia Type: General Level of consciousness: awake and alert Pain management: pain level controlled Vital Signs Assessment: post-procedure vital signs reviewed and stable Respiratory status: spontaneous breathing Cardiovascular status: stable Postop Assessment: no apparent nausea or vomiting Anesthetic complications: no     Last Vitals:  Vitals:   02/07/20 1110  BP: 125/85  Pulse: 79  Resp: 14  Temp: 36.8 C  SpO2: 97%    Last Pain:  Vitals:   02/07/20 1110  TempSrc: Oral  PainSc: [place]

## 2020-02-07 NOTE — Transfer of Care (Signed)
Immediate Anesthesia Transfer of Care Note  Patient: Nicole Ewing  Procedure(s) Performed: COLONOSCOPY WITH PROPOFOL (N/A ) POLYPECTOMY  Patient Location: PACU  Anesthesia Type:General  Level of Consciousness: awake  Airway & Oxygen Therapy: Patient Spontanous Breathing  Post-op Assessment: Report given to RN and Post -op Vital signs reviewed and stable  Post vital signs: Reviewed and stable  Last Vitals:  Vitals Value Taken Time  BP    Temp    Pulse    Resp    SpO2      Last Pain:  Vitals:   02/07/20 1110  TempSrc: Oral  PainSc: 4          Complications: No apparent anesthesia complications

## 2020-02-07 NOTE — H&P (Signed)
@LOGO @   Primary Care Physician:  Rory Percy, MD Primary Gastroenterologist:  Dr. Gala Romney  Pre-Procedure History & Physical: HPI:  Nicole Ewing is a 59 y.o. female here for high screening colonoscopy.  Multiple first-degree relatives with colonic polyps.  Currently, patient has no GI symptoms.  Reported negative colonoscopy elsewhere in 2008.  Past Medical History:  Diagnosis Date  . Acid reflux   . Anxiety   . Arthritis   . Cervical cancer ([place])   . COPD (chronic obstructive pulmonary disease) ([place])   . Depression   . H/O: hysterectomy    Cervical dysplasia  . High cholesterol   . Hyperthyroidism   . Menopause ovarian failure   . Migraines     Past Surgical History:  Procedure Laterality Date  . ABDOMINAL HYSTERECTOMY    . BREAST SURGERY Right    breast buiopsy  . COLONOSCOPY  04/06/2007   [facility]:  Normal rectum, no inflammation, no colonic or rectal polyps, tortuous and redundant colon. Recommendations to repeat TCS in 5 years (2013).   . TONSILLECTOMY    . TOTAL HIP ARTHROPLASTY Left 11/16/2018  . TOTAL HIP ARTHROPLASTY Right 07/02/2019    Prior to Admission medications   Medication Sig Start Date End Date Taking? Authorizing Provider  Calcium Carbonate-Vitamin D (CALCIUM 600+D PO) Take by mouth daily.   Yes [provider]  diazepam (VALIUM) 5 MG tablet Take 5 mg by mouth every 6 (six) hours as needed for anxiety.   Yes [provider]  HYDROcodone-acetaminophen (NORCO/VICODIN) 5-325 MG tablet Take 1 tablet by mouth every 8 (eight) hours as needed. 08/18/19  Yes [provider]  methocarbamol (ROBAXIN) 500 MG tablet Take 500 mg by mouth 4 (four) times daily.   Yes [provider]  Multiple Vitamin (MULTIVITAMIN WITH MINERALS) TABS tablet Take 1 tablet by mouth daily.   Yes [provider]  omeprazole (PRILOSEC) 20 MG capsule Take 20 mg by mouth daily.   Yes [provider]  vitamin B-12  (CYANOCOBALAMIN) 250 MCG tablet Take 250 mcg by mouth daily.   Yes [provider]  acetaminophen (TYLENOL) 650 MG CR tablet Take 650 mg by mouth every 8 (eight) hours as needed for pain.    [provider]    Allergies as of 11/08/2019 - Review Complete 11/08/2019  Allergen Reaction Noted  . Tramadol  06/05/2019  . Codeine Rash 01/27/2017  . Keflex [cephalexin] Rash 01/27/2017    Family History  Problem Relation Age of Onset  . Melanoma Father   . Graves' disease Child   . Colon polyps Brother   . Colon cancer Neg Hx     Social History   Socioeconomic History  . Marital status: Married    Spouse name: Not on file  . Number of children: Not on file  . Years of education: Not on file  . Highest education level: Not on file  Occupational History  . Not on file  Tobacco Use  . Smoking status: Current Every Day Smoker    Packs/day: 0.25    Years: 25.00    Pack years: 6.25    Types: Cigars, Cigarettes  . Smokeless tobacco: Former Systems developer  . Tobacco comment: 1-2 cigars daily  Substance and Sexual Activity  . Alcohol use: Yes    Comment: 4-6 beers daily (decresed to 2 a day or less)  . Drug use: Not Currently  . Sexual activity: Yes    Birth control/protection: Surgical  Other Topics Concern  .  Not on file  Social History Narrative  . Not on file   Social Determinants of Health   Financial Resource Strain:   . Difficulty of Paying Living Expenses: Not on file  Food Insecurity:   . Worried About Charity fundraiser in the Last Year: Not on file  . Ran Out of Food in the Last Year: Not on file  Transportation Needs:   . Lack of Transportation (Medical): Not on file  . Lack of Transportation (Non-Medical): Not on file  Physical Activity:   . Days of Exercise per Week: Not on file  . Minutes of Exercise per Session: Not on file  Stress:   . Feeling of Stress : Not on file  Social Connections:   . Frequency of Communication with Friends and Family:  Not on file  . Frequency of Social Gatherings with Friends and Family: Not on file  . Attends Religious Services: Not on file  . Active Member of Clubs or Organizations: Not on file  . Attends Archivist Meetings: Not on file  . Marital Status: Not on file  Intimate Partner Violence:   . Fear of Current or Ex-Partner: Not on file  . Emotionally Abused: Not on file  . Physically Abused: Not on file  . Sexually Abused: Not on file    Review of Systems: See HPI, otherwise negative ROS  Physical Exam: BP 125/85   Pulse 79   Temp 98.2 F (36.8 C) (Oral)   Resp 14   SpO2 97%  General:   Alert,  Well-developed, well-nourished, pleasant and cooperative in NAD Neck:  Supple; no masses or thyromegaly. No significant cervical adenopathy. Lungs:  Clear throughout to auscultation.   No wheezes, crackles, or rhonchi. No acute distress. Heart:  Regular rate and rhythm; no murmurs, clicks, rubs,  or gallops. Abdomen: Non-distended, normal bowel sounds.  Soft and nontender without appreciable mass or hepatosplenomegaly.  Pulses:  Normal pulses noted. Extremities:  Without clubbing or edema.  Impression/Plan: 59 year old lady with multiple first-degree relatives with colon polyps.  Here for high screening examination.  The risks, benefits, limitations, alternatives and imponderables have been reviewed with the patient. Questions have been answered. All parties are agreeable.      Notice: This dictation was prepared with Dragon dictation along with smaller phrase technology. Any transcriptional errors that result from this process are unintentional and may not be corrected upon review.

## 2020-02-07 NOTE — Discharge Instructions (Signed)
  Colonoscopy Discharge Instructions  Read the instructions outlined below and refer to this sheet in the next few weeks. These discharge instructions provide you with general information on caring for yourself after you leave the hospital. Your doctor may also give you specific instructions. While your treatment has been planned according to the most current medical practices available, unavoidable complications occasionally occur. If you have any problems or questions after discharge, call Dr. Gala Romney at [phone]. ACTIVITY  You may resume your regular activity, but move at a slower pace for the next 24 hours.   Take frequent rest periods for the next 24 hours.   Walking will help get rid of the air and reduce the bloated feeling in your belly (abdomen).   No driving for 24 hours (because of the medicine (anesthesia) used during the test).    Do not sign any important legal documents or operate any machinery for 24 hours (because of the anesthesia used during the test).  NUTRITION  Drink plenty of fluids.   You may resume your normal diet as instructed by your doctor.   Begin with a light meal and progress to your normal diet. Heavy or fried foods are harder to digest and may make you feel sick to your stomach (nauseated).   Avoid alcoholic beverages for 24 hours or as instructed.  MEDICATIONS  You may resume your normal medications unless your doctor tells you otherwise.  WHAT YOU CAN EXPECT TODAY  Some feelings of bloating in the abdomen.   Passage of more gas than usual.   Spotting of blood in your stool or on the toilet paper.  IF YOU HAD POLYPS REMOVED DURING THE COLONOSCOPY:  No aspirin products for 7 days or as instructed.   No alcohol for 7 days or as instructed.   Eat a soft diet for the next 24 hours.  FINDING OUT THE RESULTS OF YOUR TEST Not all test results are available during your visit. If your test results are not back during the visit, make an appointment  with your caregiver to find out the results. Do not assume everything is normal if you have not heard from your caregiver or the medical facility. It is important for you to follow up on all of your test results.  SEEK IMMEDIATE MEDICAL ATTENTION IF:  You have more than a spotting of blood in your stool.   Your belly is swollen (abdominal distention).   You are nauseated or vomiting.   You have a temperature over 101.   You have abdominal pain or discomfort that is severe or gets worse throughout the day.    1 small polyp found in your colon.  It was removed.  Further recommendations to follow ending review of pathology report  Office visit with [place] in 3 months  At patient request, I called Osa Schrag at [phone] and reviewed results.

## 2020-02-08 ENCOUNTER — Encounter: Payer: Self-pay | Admitting: Internal Medicine

## 2020-02-08 LAB — SURGICAL PATHOLOGY

## 2020-02-22 ENCOUNTER — Ambulatory Visit: Payer: PRIVATE HEALTH INSURANCE | Admitting: "Endocrinology

## 2020-02-25 ENCOUNTER — Other Ambulatory Visit (HOSPITAL_COMMUNITY)
Admission: RE | Admit: 2020-02-25 | Discharge: 2020-02-25 | Disposition: A | Discharge status: Home / Self Care | Payer: 59 | Source: Ambulatory Visit | Attending: "Endocrinology | Admitting: "Endocrinology

## 2020-02-25 DIAGNOSIS — E0590 Thyrotoxicosis, unspecified without thyrotoxic crisis or storm: Secondary | ICD-10-CM | POA: Diagnosis not present

## 2020-02-25 LAB — TSH: TSH: 0.543 u[IU]/mL (ref 0.350–4.500)

## 2020-02-25 LAB — T4, FREE: Free T4: 0.69 ng/dL (ref 0.61–1.12)

## 2020-02-26 LAB — T3, FREE: T3, Free: 2.2 pg/mL (ref 2.0–4.4)

## 2020-02-28 ENCOUNTER — Ambulatory Visit (INDEPENDENT_AMBULATORY_CARE_PROVIDER_SITE_OTHER): Payer: 59 | Admitting: "Endocrinology

## 2020-02-28 ENCOUNTER — Encounter: Payer: Self-pay | Admitting: "Endocrinology

## 2020-02-28 ENCOUNTER — Other Ambulatory Visit: Payer: Self-pay

## 2020-02-28 ENCOUNTER — Ambulatory Visit: Payer: Self-pay | Admitting: "Endocrinology

## 2020-02-28 VITALS — BP 138/88 | HR 79 | Ht 65.0 in | Wt 139.6 lb

## 2020-02-28 DIAGNOSIS — E0520 Thyrotoxicosis with toxic multinodular goiter without thyrotoxic crisis or storm: Secondary | ICD-10-CM

## 2020-02-28 NOTE — Progress Notes (Signed)
02/28/2020, 1:16 PM                        Endocrinology follow-up note  Subjective:    Patient ID: Nicole Ewing, female    DOB: 1961/05/20, PCP Rory Percy, MD   Past Medical History:  Diagnosis Date  . Acid reflux   . Anxiety   . Arthritis   . Cervical cancer ([place])   . COPD (chronic obstructive pulmonary disease) ([place])   . Depression   . H/O: hysterectomy    Cervical dysplasia  . High cholesterol   . Hyperthyroidism   . Menopause ovarian failure   . Migraines    Past Surgical History:  Procedure Laterality Date  . ABDOMINAL HYSTERECTOMY    . BREAST SURGERY Right    breast buiopsy  . COLONOSCOPY  04/06/2007   [facility]:  Normal rectum, no inflammation, no colonic or rectal polyps, tortuous and redundant colon. Recommendations to repeat TCS in 5 years (2013).   . COLONOSCOPY WITH PROPOFOL N/A 02/07/2020   Procedure: COLONOSCOPY WITH PROPOFOL;  Surgeon: Daneil Dolin, MD;  [facility];  Service: Endoscopy;  Laterality: N/A;  12:15pm  . POLYPECTOMY  02/07/2020   Procedure: POLYPECTOMY;  Surgeon: Daneil Dolin, MD;  Location: [facility];  Service: Endoscopy;;  . TONSILLECTOMY    . TOTAL HIP ARTHROPLASTY Left 11/16/2018  . TOTAL HIP ARTHROPLASTY Right 07/02/2019   Social History   Socioeconomic History  . Marital status: Married    Spouse name: Not on file  . Number of children: Not on file  . Years of education: Not on file  . Highest education level: Not on file  Occupational History  . Not on file  Tobacco Use  . Smoking status: Current Every Day Smoker    Packs/day: 0.25    Years: 25.00    Pack years: 6.25    Types: Cigars, Cigarettes  . Smokeless tobacco: Former Systems developer  . Tobacco comment: 1-2 cigars daily  Substance and Sexual Activity  . Alcohol use: Yes    Comment: 4-6 beers daily (decresed to 2 a day or less)  . Drug use: Not Currently  . Sexual activity: Yes    Birth  control/protection: Surgical  Other Topics Concern  . Not on file  Social History Narrative  . Not on file   Social Determinants of Health   Financial Resource Strain:   . Difficulty of Paying Living Expenses:   Food Insecurity:   . Worried About Charity fundraiser in the Last Year:   . Arboriculturist in the Last Year:   Transportation Needs:   . Film/video editor (Medical):   Marland Kitchen Lack of Transportation (Non-Medical):   Physical Activity:   . Days of Exercise per Week:   . Minutes of Exercise per Session:   Stress:   . Feeling of Stress :   Social Connections:   . Frequency of Communication with Friends and Family:   . Frequency of Social Gatherings with Friends and Family:   . Attends Religious Services:   . Active Member of Clubs or Organizations:   . Attends Archivist Meetings:   .  Marital Status:    Family History  Problem Relation Age of Onset  . Melanoma Father   . Graves' disease Child   . Colon polyps Brother   . Colon cancer Neg Hx    Outpatient Encounter Medications as of 02/28/2020  Medication Sig  . Loratadine (CLARITIN ALLERGY CHILDRENS PO) Take 1 tablet by mouth daily.  Marland Kitchen acetaminophen (TYLENOL) 650 MG CR tablet Take 650 mg by mouth every 8 (eight) hours as needed for pain.  . Calcium Carbonate-Vitamin D (CALCIUM 600+D PO) Take by mouth daily.  . diazepam (VALIUM) 5 MG tablet Take 5 mg by mouth every 6 (six) hours as needed for anxiety.  Marland Kitchen HYDROcodone-acetaminophen (NORCO/VICODIN) 5-325 MG tablet Take 1 tablet by mouth every 8 (eight) hours as needed.  . methocarbamol (ROBAXIN) 500 MG tablet Take 500 mg by mouth 4 (four) times daily.  . Multiple Vitamin (MULTIVITAMIN WITH MINERALS) TABS tablet Take 1 tablet by mouth daily.  Marland Kitchen omeprazole (PRILOSEC) 20 MG capsule Take 20 mg by mouth daily.  . vitamin B-12 (CYANOCOBALAMIN) 250 MCG tablet Take 250 mcg by mouth daily.   No facility-administered encounter medications on file as of 02/28/2020.    ALLERGIES: Allergies  Allergen Reactions  . Tramadol   . Codeine Rash  . Keflex [Cephalexin] Rash    VACCINATION STATUS:  There is no immunization history on file for this patient.  HPI Nicole Ewing is 59 y.o. female who is seen in follow-up after she was seen in consultation for hyperthyroidism. PMD: Rory Percy, MD. She was diagnosed with hyperthyroidism in May 2020 when she was initiated on methimazole 10 mg p.o. daily. -After her thyroid function tests were reviewed on September 26, 2019, she was advised to discontinue methimazole. -Her thyroid uptake and scan on October 23, 2019 showed 25.6% 24-hour uptake.  The thyroid  Scan was reported to be inhomogeneous with relative increased tracer accumulation right superior and left inferior lobes and relative decreased uptake at the right inferior pole suggesting presence of at least one thyroid nodule and ultrasound of the thyroid was suggested.  She underwent fine-needle biopsy of this nodule with benign findings.  She is off of methimazole.  She denies any new complaints. Currently, she denies palpitations, heat intolerance, tremors.  She does have family history of thyroid dysfunction in her daughter.    She denies dysphagia, shortness of breath, nor voice change. She admits to upto 6 beers daily.  Review of Systems Limited as above.  Objective:    BP 138/88   Pulse 79   Ht 5\' 5"  (1.651 m)   Wt 139 lb 9.6 oz (63.3 kg)   BMI 23.23 kg/m   Wt Readings from Last 3 Encounters:  02/28/20 139 lb 9.6 oz (63.3 kg)  02/05/20 136 lb (61.7 kg)  01/29/20 136 lb (61.7 kg)    Physical Exam  CMP ( most recent) CMP     Component Value Date/Time   NA 142 06/05/2019 1446   K 3.9 06/05/2019 1446   CL 107 06/05/2019 1446   CO2 24 06/05/2019 1446   GLUCOSE 110 06/05/2019 1446   BUN 5 (L) 06/05/2019 1446   BUN 7 04/26/2019 0000   CREATININE 0.57 12/04/2019 1353   CREATININE 0.43 (L) 06/05/2019 1446   CALCIUM 9.8 06/05/2019  1446   PROT 6.6 08/31/2019 1124   AST 24 08/31/2019 1124   ALT 16 08/31/2019 1124   BILITOT 0.2 08/31/2019 1124   GFRNONAA >60 12/04/2019 1353   GFRNONAA 112  06/05/2019 1446   GFRAA >60 12/04/2019 1353   GFRAA 130 06/05/2019 1446    Lipid Panel     Component Value Date/Time   CHOL 188 04/26/2019 0000   TRIG 92 04/26/2019 0000   HDL 68 04/26/2019 0000   LDLCALC 102 04/26/2019 0000      Lab Results  Component Value Date   TSH 0.543 02/25/2020   TSH 0.95 09/26/2019   TSH 0.01 (A) 04/26/2019   TSH 0.01 (A) 04/26/2019   FREET4 0.69 02/25/2020   FREET4 0.7 (L) 09/26/2019      Assessment & Plan:   1. Hyperthyroidism-controlled.  -Based on her previsit thyroid function tests, she is adequately treated with methimazole.  She will not need antithyroid intervention at this time.  She is advised to return in 6 months with repeat thyroid function tests.     Given her history of alcoholism, long-term methimazole  therapy is  unsafe for her. -If she has relapse of hyperthyroidism, she will be considered for I-131 thyroid ablation. -She has a negative fine-needle aspiration of a thyroid nodule.    -She has elevated transaminases likely related to alcohol abuse which needs close follow-up. I did not initiate any new prescriptions today. - I advised her  to maintain close follow up with Rory Percy, MD for primary care needs.     - Time spent on this patient care encounter:  25 minutes of which 50% was spent in  counseling and the rest reviewing  her current and  previous labs / studies and medications  doses and developing a plan for long term care. Salomon Mast Poppe  participated in the discussions, expressed understanding, and voiced agreement with the above plans.  All questions were answered to her satisfaction. she is encouraged to contact clinic should she have any questions or concerns prior to her return visit.   Follow up plan: Return in about 6 months (around 08/30/2020)  for Follow up with Pre-visit Labs.   Glade Lloyd, MD [facility] [address] [place], [place] [address] Phone: [phone]  Fax: [phone]     02/28/2020, 1:16 PM  This note was partially dictated with voice recognition software. Similar sounding words can be transcribed inadequately or may not  be corrected upon review.

## 2020-05-15 NOTE — Progress Notes (Signed)
Referring Provider: Rory Percy, MD Primary Care Physician:  Rory Percy, MD Primary GI Physician: Dr. Gala Romney  Chief Complaint  Patient presents with  . elevated LFT    pp fu    HPI:   Nicole Ewing is a 59 y.o. female with a history of GERD on omeprazole 20 mg daily, elevated LFTs (AST, ALT, and alk phos),  daily alcohol use, history of intranasal drug use in her 20s, and diarrhea thought to be secondary to lactose intolerance. She presents today for follow-up of elevated LFTs, diarrhea, and s/p colonoscopy for colon cancer screening.   Prior evaluation of LFTs revealed history of hepatitis C with undetectable RNA indicating past/resolved infection.  Hepatitis B serologies negative.  GGT normal.  ASMA negative.  ANA positive.  AMA elevated at 173.  Notably, AST/ALT normalized in September 2020.  Alk phos remained slightly elevated but stable at 158.  MRI/MRCP was ordered to help evaluate bile ducts/PBC at her last visit in December 2020.  Notably, she was also still working on reducing her alcohol intake at that time and was consuming about 2 beers a day.  MRI with no intra or extrahepatic biliary dilation.  Liver unremarkable other than small cyst.  Advise she continue to work on alcohol cessation and we would recheck labs at next office visit.  She did have mild retroperitoneal and mesenteric lymphadenopathy with recommendations to pursue dedicated CT A/P with IV contrast.   CT A/P with contrast 12/06/2019: Retroperitoneal adenopathy again noted.  Several lymph nodes approximately 1 cm in size.  Stated none of the lymph nodes are particularly amendable to percutaneous biopsy given relatively small size.  She did have a focal area of thickening in her bladder wall with recommendations for urology consultation to exclude underlying mass.  She was advised to follow-up with PCP regarding monitoring/further evaluation of retroperitoneal lymph nodes.  Referral also placed to  urology.  Cystoscopy on 01/29/20 normal. Suspected abnormal appearance of bladder wall on CT was secondary to decompression with redundancy of urothelium.   Colonoscopy 02/10/20: 1 hyperplastic polyp. Otherwise normal exam. Recommended repeat in 5 years.   Today:  Was put on disability due to back and hip trouble.   No abdominal pain. No nausea or vomiting. Taking omeprazole daily which controlls GERD very well. No dysphaghia. BMs daily. Not eating cheese very often. Diarrhea has resolved unless she has something dairy. No blood in the stool or black stool. No confusion, jaundice, swelling in abdomen or LE, or easy bruising.  Hep B vaccination- complete.  Also had covid vaccination.   Drinking 8-9 12 oz beer a week.    Past Medical History:  Diagnosis Date  . Acid reflux   . Anxiety   . Arthritis   . Cervical cancer ([place])   . COPD (chronic obstructive pulmonary disease) ([place])   . Depression   . H/O: hysterectomy    Cervical dysplasia  . High cholesterol   . Hyperthyroidism   . Menopause ovarian failure   . Migraines     Past Surgical History:  Procedure Laterality Date  . ABDOMINAL HYSTERECTOMY    . BREAST SURGERY Right    breast buiopsy  . COLONOSCOPY  04/06/2007   [facility]:  Normal rectum, no inflammation, no colonic or rectal polyps, tortuous and redundant colon. Recommendations to repeat TCS in 5 years (2013).   . COLONOSCOPY WITH PROPOFOL N/A 02/07/2020   Procedure: COLONOSCOPY WITH PROPOFOL;  Surgeon: Daneil Dolin, MD;  1 hyperplastic  polyp. Otherwise normal exam. Recommended repeat in 5 years.   Marland Kitchen POLYPECTOMY  02/07/2020   Procedure: POLYPECTOMY;  Surgeon: Daneil Dolin, MD;  [facility];  Service: Endoscopy;;  . TONSILLECTOMY    . TOTAL HIP ARTHROPLASTY Left 11/16/2018  . TOTAL HIP ARTHROPLASTY Right 07/02/2019    Current Outpatient Medications  Medication Sig Dispense Refill  . acetaminophen (TYLENOL) 650 MG CR tablet Take 650 mg by mouth  every 8 (eight) hours as needed for pain.    . Calcium Carbonate-Vitamin D (CALCIUM 600+D PO) Take by mouth daily.    . diazepam (VALIUM) 5 MG tablet Take 5 mg by mouth every 6 (six) hours as needed for anxiety.    Marland Kitchen HYDROcodone-acetaminophen (NORCO/VICODIN) 5-325 MG tablet Take 1 tablet by mouth every 8 (eight) hours as needed.    . Loratadine (CLARITIN ALLERGY CHILDRENS PO) Take 1 tablet by mouth daily.    . methocarbamol (ROBAXIN) 500 MG tablet Take 500 mg by mouth 4 (four) times daily.    . Multiple Vitamin (MULTIVITAMIN WITH MINERALS) TABS tablet Take 1 tablet by mouth daily.    Marland Kitchen omeprazole (PRILOSEC) 20 MG capsule Take 20 mg by mouth daily.    . vitamin B-12 (CYANOCOBALAMIN) 250 MCG tablet Take 250 mcg by mouth daily.     No current facility-administered medications for this visit.    Allergies as of 05/16/2020 - Review Complete 05/16/2020  Allergen Reaction Noted  . Tramadol  06/05/2019  . Codeine Rash 01/27/2017  . Keflex [cephalexin] Rash 01/27/2017    Family History  Problem Relation Age of Onset  . Melanoma Father   . Graves' disease Child   . Colon polyps Brother   . Colon cancer Neg Hx     Social History   Socioeconomic History  . Marital status: Married    Spouse name: Not on file  . Number of children: Not on file  . Years of education: Not on file  . Highest education level: Not on file  Occupational History  . Not on file  Tobacco Use  . Smoking status: Current Every Day Smoker    Packs/day: 0.25    Years: 25.00    Pack years: 6.25    Types: Cigars, Cigarettes  . Smokeless tobacco: Former Systems developer  . Tobacco comment: 1-2 cigars daily  Vaping Use  . Vaping Use: Never used  Substance and Sexual Activity  . Alcohol use: Yes    Comment: 4-6 beers daily (decresed to 2 a day or less)  . Drug use: Not Currently  . Sexual activity: Yes    Birth control/protection: Surgical  Other Topics Concern  . Not on file  Social History Narrative  . Not on file    Social Determinants of Health   Financial Resource Strain:   . Difficulty of Paying Living Expenses:   Food Insecurity:   . Worried About Charity fundraiser in the Last Year:   . Arboriculturist in the Last Year:   Transportation Needs:   . Film/video editor (Medical):   Marland Kitchen Lack of Transportation (Non-Medical):   Physical Activity:   . Days of Exercise per Week:   . Minutes of Exercise per Session:   Stress:   . Feeling of Stress :   Social Connections:   . Frequency of Communication with Friends and Family:   . Frequency of Social Gatherings with Friends and Family:   . Attends Religious Services:   . Active Member of  Clubs or Organizations:   . Attends Archivist Meetings:   Marland Kitchen Marital Status:     Review of Systems: Gen: Denies fever, chills, lightheadedness, dizziness, presyncope, syncope. CV: Denies chest pain, palpitations Resp: Denies dyspnea or cough GI: See HPI Heme: See HPI  Physical Exam: BP 135/81   Pulse (!) 106   Temp (!) 97.1 F (36.2 C) (Oral)   Ht '5\' 5"'  (1.651 m)   Wt 131 lb 3.2 oz (59.5 kg)   BMI 21.83 kg/m  General:   Alert and oriented. No distress noted. Pleasant and cooperative.  Head:  Normocephalic and atraumatic. Eyes:  Conjuctiva clear without scleral icterus. Heart:  S1, S2 present without murmurs appreciated. Lungs:  Clear to auscultation bilaterally. No wheezes, rales, or rhonchi. No distress.  Abdomen:  +BS, soft, non-tender and non-distended. No rebound or guarding. No HSM or masses noted. Msk:  Symmetrical without gross deformities. Normal posture. Extremities:  Without edema. Neurologic:  Alert and  oriented x4 Psych:  Normal mood and affect.

## 2020-05-16 ENCOUNTER — Encounter: Payer: Self-pay | Admitting: Gastroenterology

## 2020-05-16 ENCOUNTER — Other Ambulatory Visit: Payer: Self-pay

## 2020-05-16 ENCOUNTER — Ambulatory Visit (INDEPENDENT_AMBULATORY_CARE_PROVIDER_SITE_OTHER): Payer: 59 | Admitting: Gastroenterology

## 2020-05-16 VITALS — BP 135/81 | HR 106 | Temp 97.1°F | Ht 65.0 in | Wt 131.2 lb

## 2020-05-16 DIAGNOSIS — R197 Diarrhea, unspecified: Secondary | ICD-10-CM

## 2020-05-16 DIAGNOSIS — R7989 Other specified abnormal findings of blood chemistry: Secondary | ICD-10-CM | POA: Diagnosis not present

## 2020-05-16 NOTE — Assessment & Plan Note (Signed)
History of elevated LFTs noted in May 2020 with AST/ALT normalizing in September 2020.  Alk phos remained slightly elevated but stable at 158.  Prior work-up has revealed history of hepatitis C with undetectable hep C RNA indicating past/resolved infection.  Hep B serologies negative.  She has now completed hep B vaccine.  GGT normal.  ASMA negative.  ANA positive with elevated ANA titer, AMA elevated at 173.  Ultrasound revealed fatty liver.  MRI/MRCP to evaluate bile ducts with no evidence of extrahepatic biliary dilation.  Liver was unremarkable other than small cyst. Initially admitted to drinking 4-6 beers daily but is down to 8-9 beer per week.  No signs or symptoms of decompensated liver disease.  Suspect elevated LFTs are likely secondary to alcohol abuse.  However, with positive ANA and positive AMA, this does raise a concern for PBC; however, alk phos is not 1.5 times upper limit of normal.  We will plan to recheck HFP at this time.  Discussed the importance of her continuing to work on limiting alcohol and working towards abstinence.  If alk phos continues to remain slightly elevated, will likely go ahead and start her on Urso as early PBC can be seen with positive AMA and normal alk phos.  Follow-up in 3 months.

## 2020-05-16 NOTE — Assessment & Plan Note (Signed)
Essentially resolved with avoidance of dairy products. Notes she does occasionally give in and eat cheese and will "pay for it" the next day. No alarm symptoms. Colonoscopy up to date in March 2021 with 1 hyperplastic polyp. Otherwise normal exam. Recommended repeat in 5 years.   Recommended she continue to follow a lactose-free diet.  If she was going to have something dairy, she was advised to take Lactaid tablets prior.  Follow-up in 3 months.

## 2020-05-16 NOTE — Patient Instructions (Signed)
Please have your labs drawn at [facility] in the next week.  I suspect your elevated liver function tests are likely secondary to alcohol use.  As we discussed, I recommend you focus on limiting alcohol and working towards abstinence over the next 3 months.   We will plan to see you back in 3 months and recheck labs at that time as well.  Do not hesitate to call if any questions or concerns prior.  If you have a great time at the beach!  Aliene Altes, PA-C [facility]

## 2020-05-19 NOTE — Progress Notes (Signed)
Cc'ed to pcp °

## 2020-05-20 ENCOUNTER — Other Ambulatory Visit: Payer: Self-pay

## 2020-05-20 ENCOUNTER — Other Ambulatory Visit (HOSPITAL_COMMUNITY)
Admission: RE | Admit: 2020-05-20 | Discharge: 2020-05-20 | Disposition: A | Discharge status: Home / Self Care | Payer: 59 | Source: Ambulatory Visit | Attending: Gastroenterology | Admitting: Gastroenterology

## 2020-05-20 DIAGNOSIS — R7989 Other specified abnormal findings of blood chemistry: Secondary | ICD-10-CM | POA: Insufficient documentation

## 2020-05-20 LAB — HEPATIC FUNCTION PANEL
ALT: 13 U/L (ref 0–44)
AST: 26 U/L (ref 15–41)
Albumin: 4.1 g/dL (ref 3.5–5.0)
Alkaline Phosphatase: 94 U/L (ref 38–126)
Bilirubin, Direct: 0.1 mg/dL (ref 0.0–0.2)
Indirect Bilirubin: 0.4 mg/dL (ref 0.3–0.9)
Total Bilirubin: 0.5 mg/dL (ref 0.3–1.2)
Total Protein: 7.2 g/dL (ref 6.5–8.1)

## 2020-06-07 IMAGING — US US FNA BIOPSY THYROID 1ST LESION
1 series · 13 of 13 positions shown · non-contrast
Comparison: October 31, 2019.

CLINICAL DATA: Left thyroid nodule.

EXAM:
ULTRASOUND GUIDED NEEDLE ASPIRATE BIOPSY OF THE THYROID GLAND

[Series 1: us fna biopsy thyroid 1st lesion · 13 of 13 slices shown]
[im 1/13]
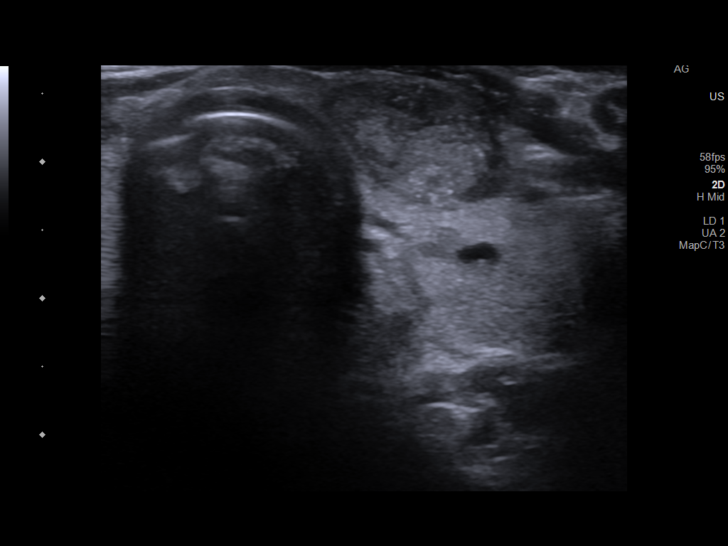
[im 2/13]
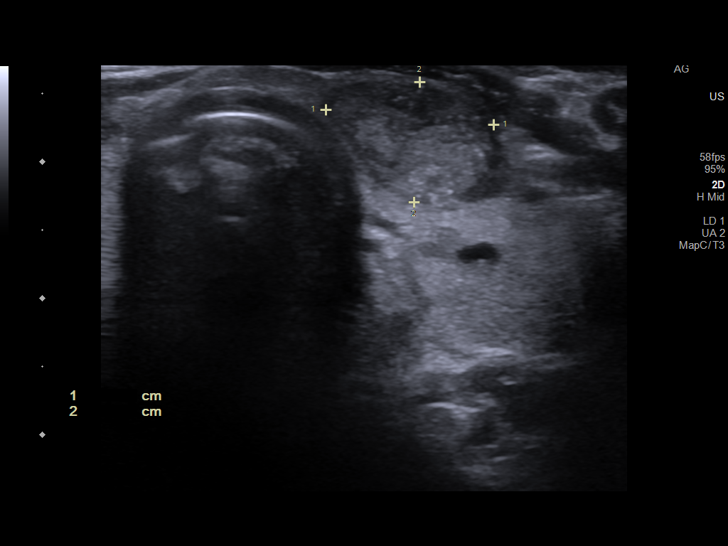
[im 3/13]
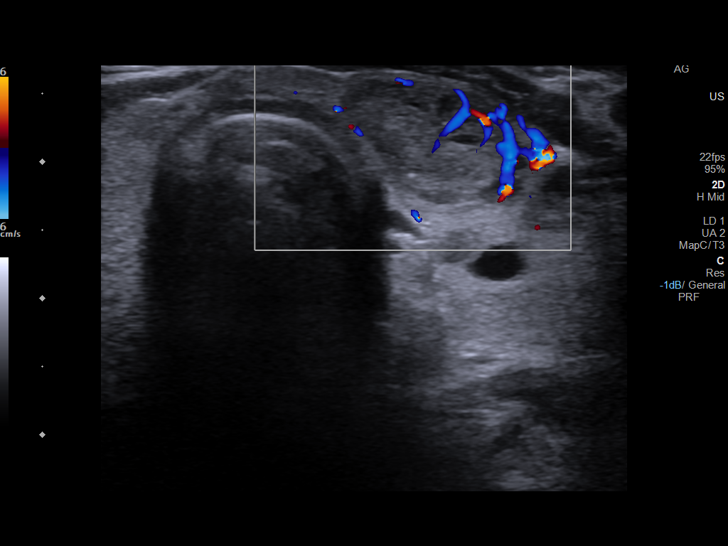
[im 4/13]
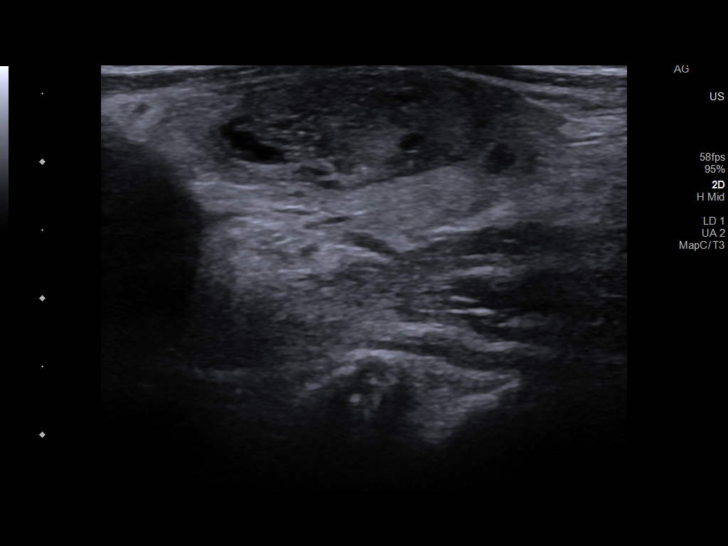
[im 5/13]
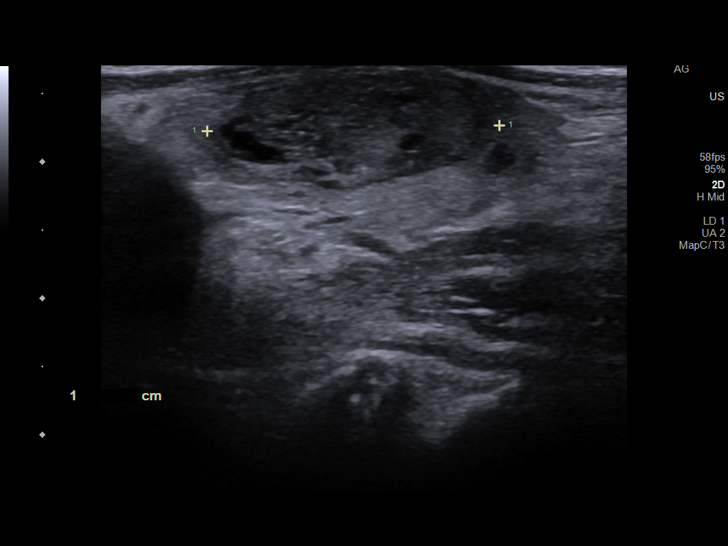
[im 6/13]
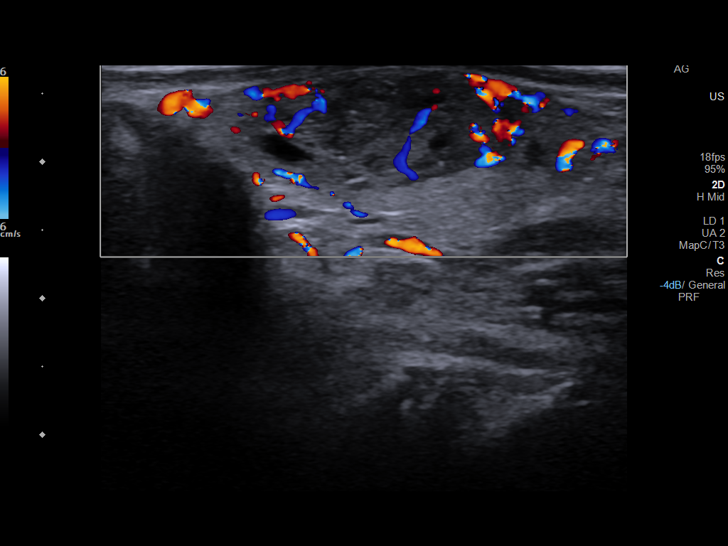
[im 7/13]
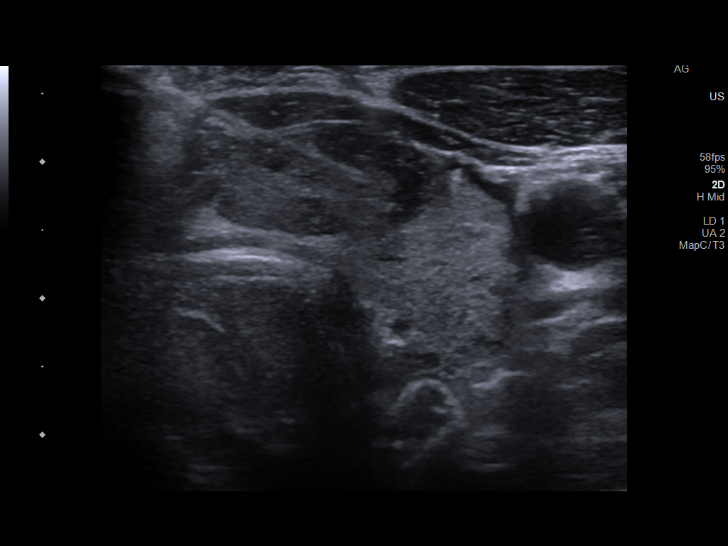
[im 8/13]
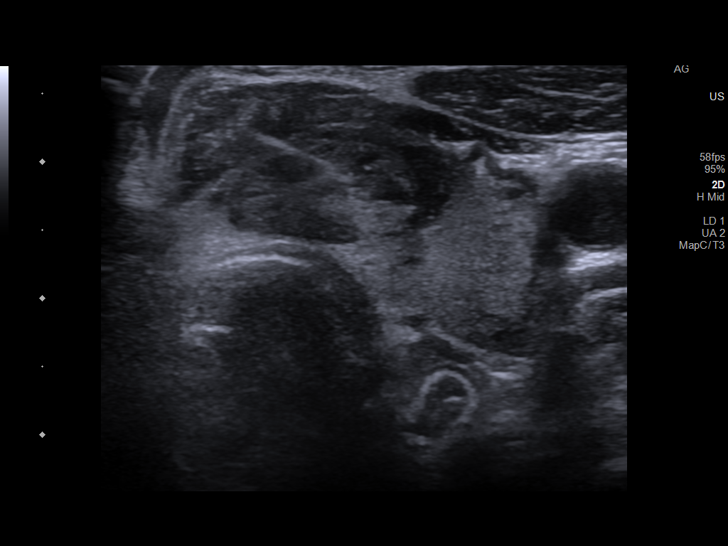
[im 9/13]
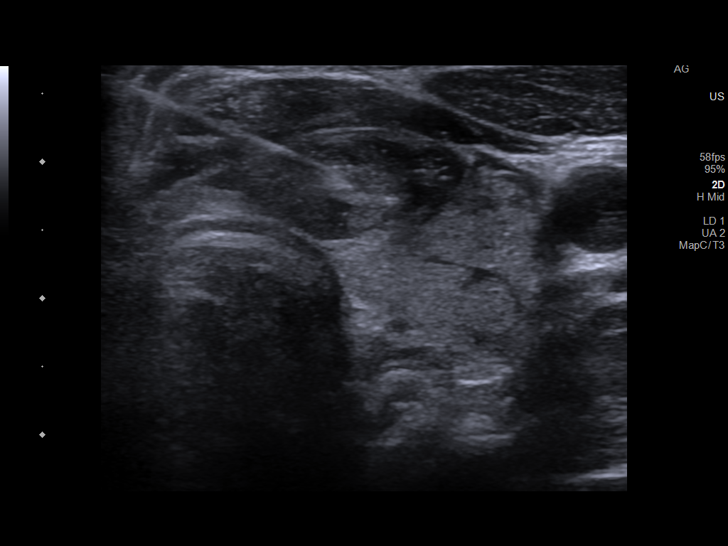
[im 10/13]
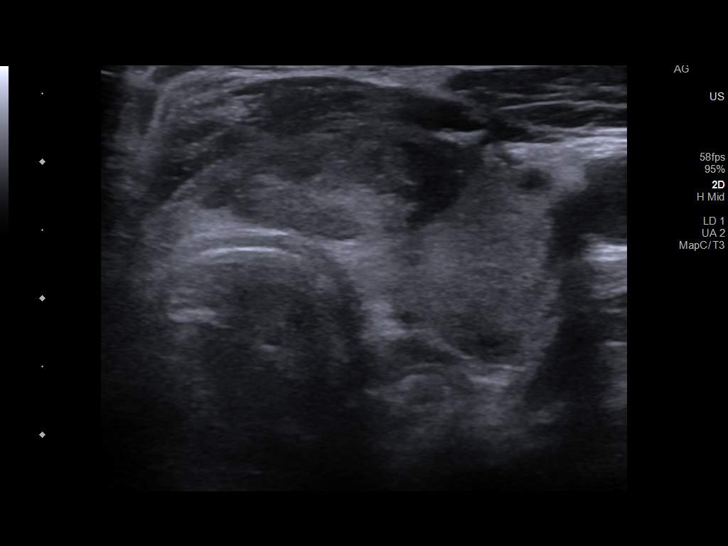
[im 11/13]
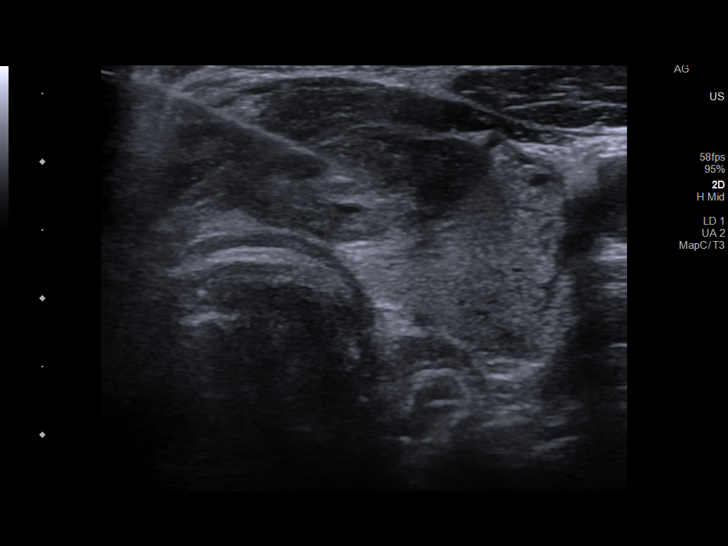
[im 12/13]
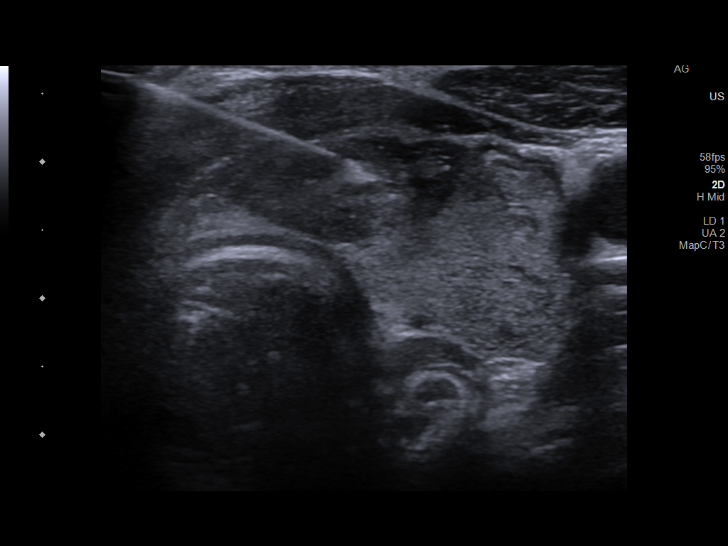
[im 13/13]
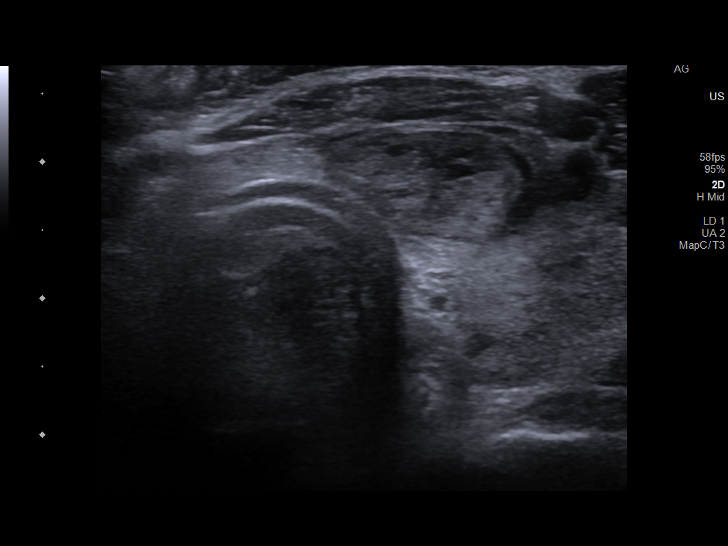

[13 of 13 positions shown; findings below may reference images not displayed]

PROCEDURE:
Thyroid biopsy was thoroughly discussed with the patient and
questions were answered. The benefits, risks, alternatives, and
complications were also discussed. The patient understands and
wishes to proceed with the procedure. Written consent was obtained.

Ultrasound was performed to localize and mark an adequate site for
the biopsy. The patient was then prepped and draped in a normal
sterile fashion. Local anesthesia was provided with 1% lidocaine.
Using direct ultrasound guidance, 5 passes were made using needles
into the nodule within the left lobe of the thyroid. Ultrasound was
used to confirm needle placements on all occasions. Specimens were
sent to Pathology for analysis.

Complications:  None.
FINDINGS: Under ultrasound guidance, fine needle aspiration of left thyroid
nodule was performed.
IMPRESSION: Ultrasound guided needle aspirate biopsy performed of the thyroid
nodule.

## 2020-06-29 IMAGING — CT CT ABD-PELV W/ CM
2 of 5 series · 16 of 46 positions shown, 18 images · IV contrast (omnipaque)
Comparison: None.

CLINICAL DATA: Retroperitoneal adenopathy.

EXAM:
CT ABDOMEN AND PELVIS WITH CONTRAST
TECHNIQUE: Multidetector CT imaging of the abdomen and pelvis was performed
using the standard protocol following bolus administration of
intravenous contrast.
CONTRAST:  100mL OMNIPAQUE IOHEXOL 300 MG/ML  SOLN

[Series 2: axial (person_name) (person_name) · axial · 0.73mm/px · z∈[-769,-369]mm · 13 of 94 slices shown, 15 images]
[im 7/94  soft-tissue]
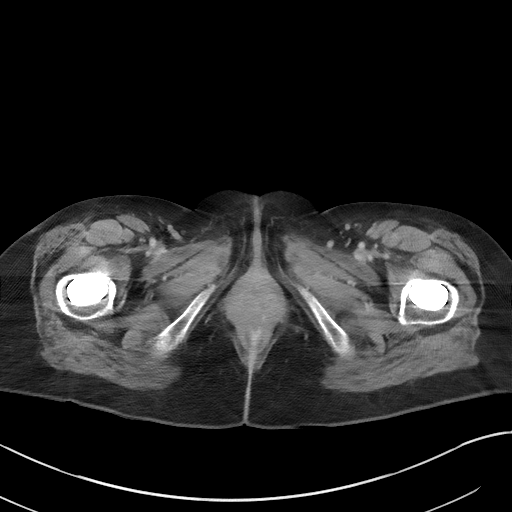
[im 7/94  bone]
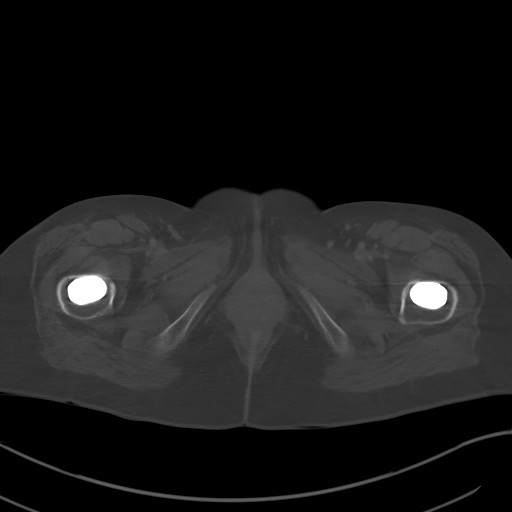
[im 14/94  soft-tissue]
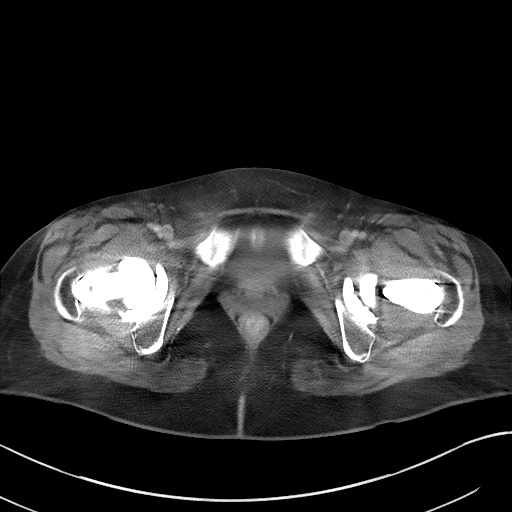
[im 20/94  soft-tissue]
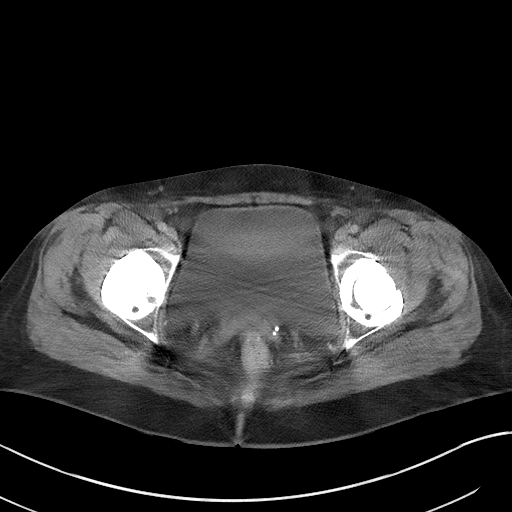
[im 27/94  soft-tissue]
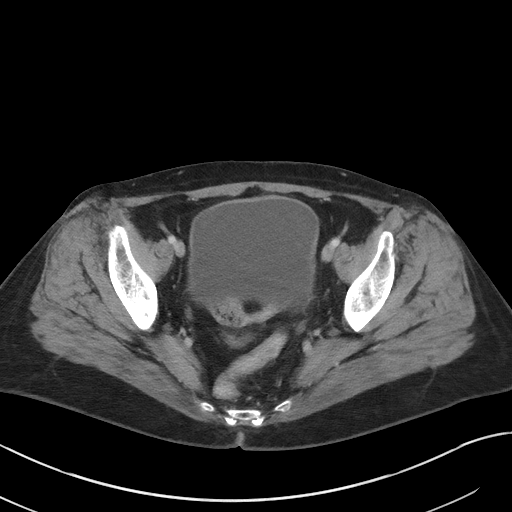
[im 34/94  soft-tissue]
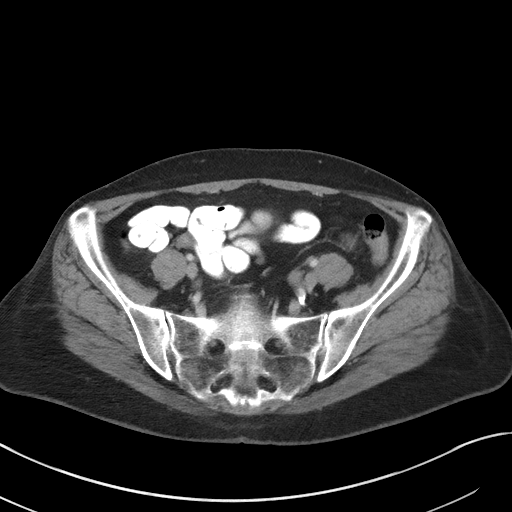
[im 40/94  soft-tissue]
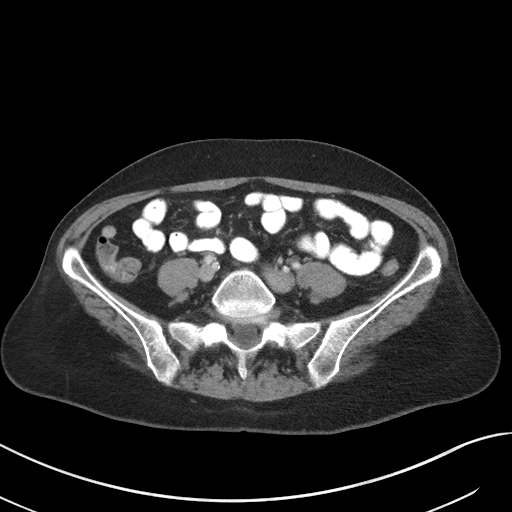
[im 47/94  soft-tissue]
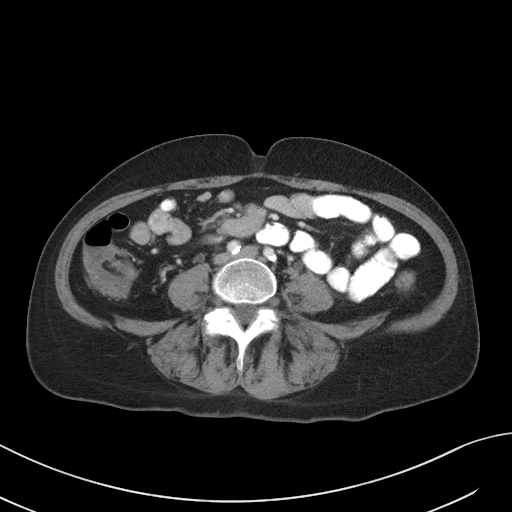
[im 54/94  soft-tissue]
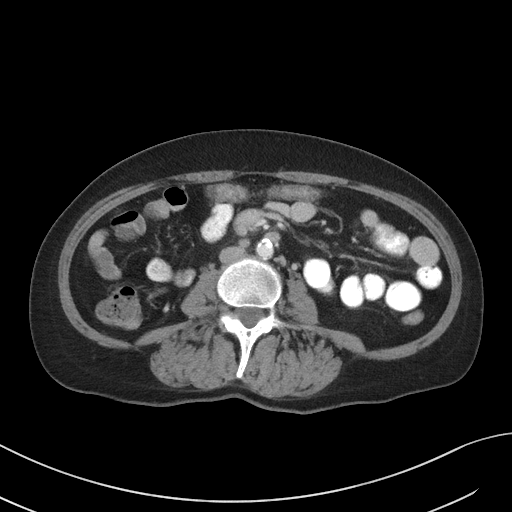
[im 60/94  soft-tissue]
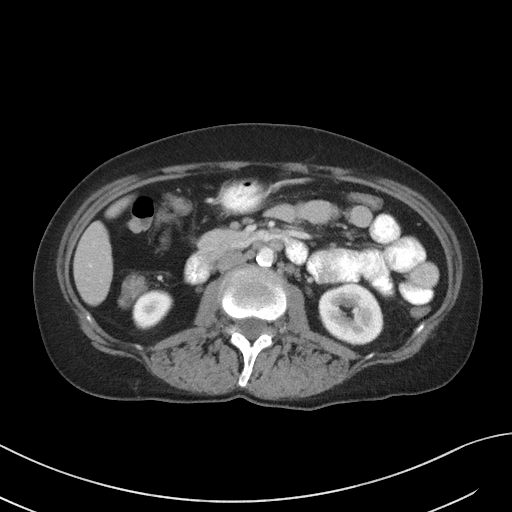
[im 60/94  bone]
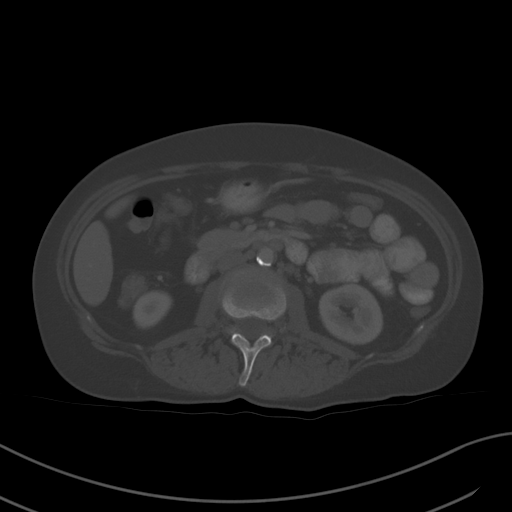
[im 67/94  soft-tissue]
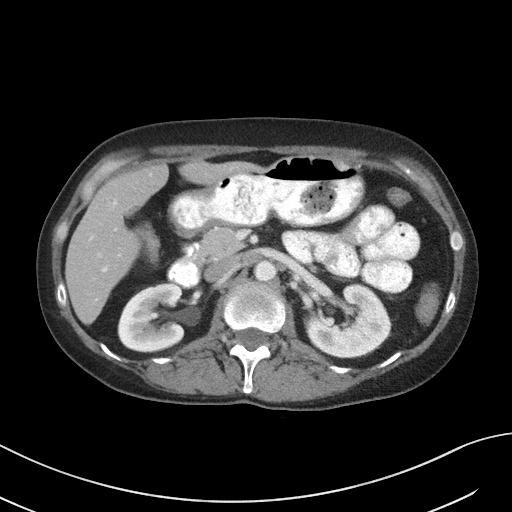
[im 74/94  soft-tissue]
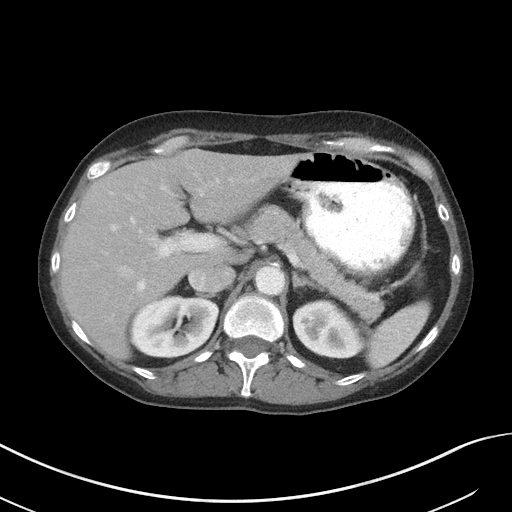
[im 80/94  soft-tissue]
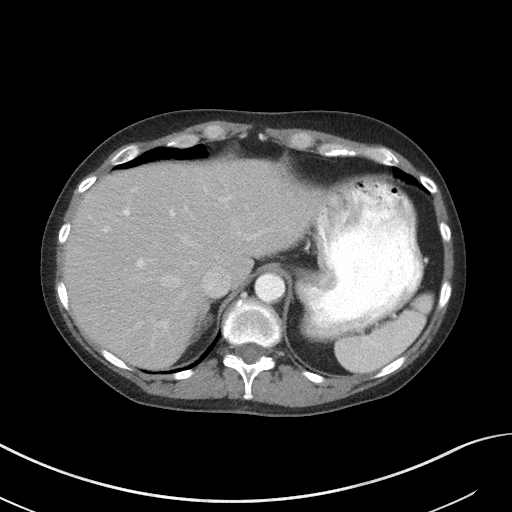
[im 87/94  soft-tissue]
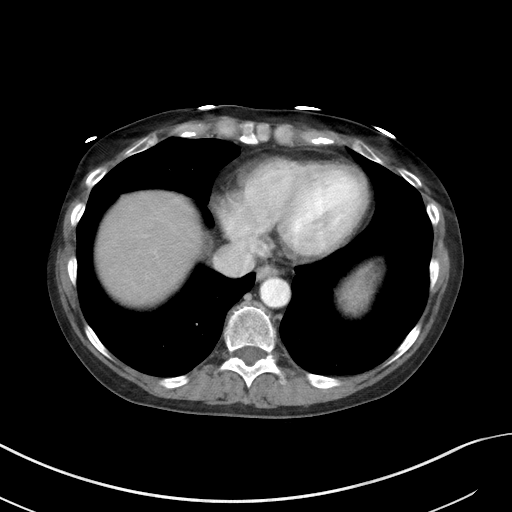

[Series 6: coronal st · coronal · 0.82mm/px · 3 of 89 slices shown]
[im 30/89  soft-tissue]
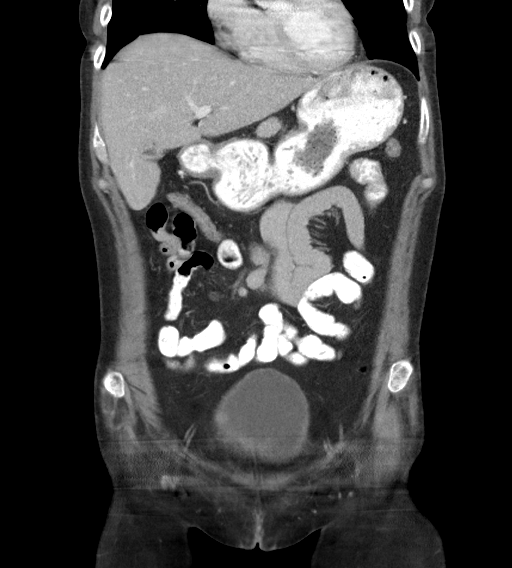
[im 40/89  soft-tissue]
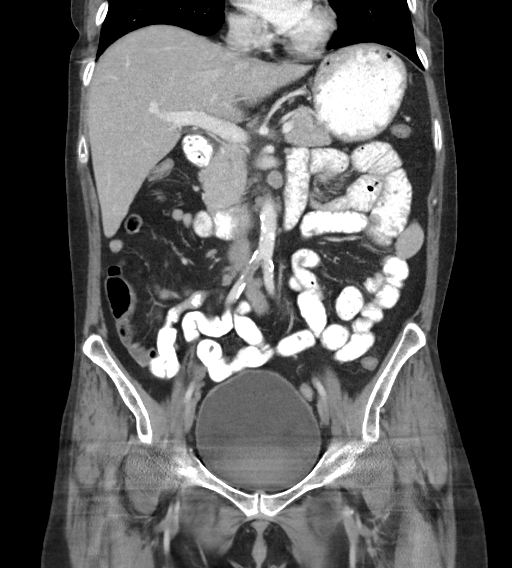
[im 49/89  soft-tissue]
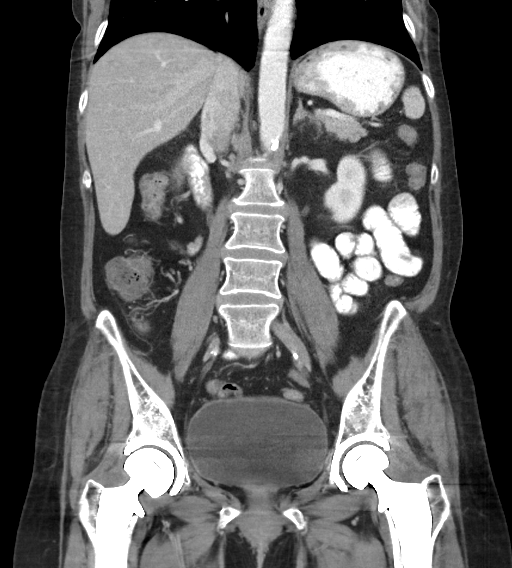

[16 of 46 positions shown; findings below may reference images not displayed]

FINDINGS: Lower chest: The lung bases are clear. The heart size is normal.

Hepatobiliary: There is a left hepatic lobe cyst. Normal
gallbladder.There is no biliary ductal dilation.

Pancreas: Normal contours without ductal dilatation. No
peripancreatic fluid collection.

Spleen: No splenic laceration or hematoma.

Adrenals/Urinary Tract:

--Adrenal glands: No adrenal hemorrhage.

--Right kidney/ureter: There is mild right-sided collecting system
dilatation to the level of the urinary bladder.

--Left kidney/ureter: There is mild left-sided collecting system
dilatation to the level of the urinary bladder.

--Urinary bladder: The bladder is moderately distended. There is a
focal area of thickening involving the anterior bladder wall
measuring approximately 1.3 cm in length.

Stomach/Bowel:

--Stomach/Duodenum: No hiatal hernia or other gastric abnormality.
Normal duodenal course and caliber.

--Small bowel: No dilatation or inflammation.

--Colon: No focal abnormality.

--Appendix: Normal.

Vascular/Lymphatic: Atherosclerotic calcification is present within
the non-aneurysmal abdominal aorta, without hemodynamically
significant stenosis.

--retroperitoneal adenopathy is again noted. There is several lymph
nodes up to approximately 1 cm in the short axis. None of the
retroperitoneal lymph nodes are particularly amenable to
percutaneous biopsy given their relatively small size.

--No mesenteric lymphadenopathy.

--No pelvic or inguinal lymphadenopathy.

Reproductive: Status post hysterectomy. No adnexal mass.

Other: No ascites or free air. The abdominal wall is normal.

Musculoskeletal. No acute displaced fractures.
IMPRESSION: 1. There is a focal area of thickening involving the anterior
bladder wall measuring approximately 1.3 cm in length. While this
could represent a focal area of bladder wall thickening, an
underlying mass is not excluded. Outpatient urology consultation is
recommended.
2. Persistent retroperitoneal adenopathy of unknown clinical
significance.

Aortic Atherosclerosis ([id number]).

## 2020-08-08 ENCOUNTER — Telehealth: Payer: Self-pay | Admitting: Internal Medicine

## 2020-08-08 NOTE — Telephone Encounter (Signed)
Spoke with pt. Per Charlsie Merles, PA last note. Pt will repeat labs at her next apt. Pt is aware.

## 2020-08-08 NOTE — Telephone Encounter (Signed)
Pt called to say that she was going to have labs done today at the lab at [facility] and they don't have any orders on her. Please advise. [phone]

## 2020-08-12 DIAGNOSIS — M545 Low back pain: Secondary | ICD-10-CM | POA: Diagnosis not present

## 2020-08-12 DIAGNOSIS — R531 Weakness: Secondary | ICD-10-CM | POA: Diagnosis not present

## 2020-08-12 DIAGNOSIS — M2569 Stiffness of other specified joint, not elsewhere classified: Secondary | ICD-10-CM | POA: Diagnosis not present

## 2020-08-12 DIAGNOSIS — R262 Difficulty in walking, not elsewhere classified: Secondary | ICD-10-CM | POA: Diagnosis not present

## 2020-08-12 DIAGNOSIS — M799 Soft tissue disorder, unspecified: Secondary | ICD-10-CM | POA: Diagnosis not present

## 2020-08-13 DIAGNOSIS — M545 Low back pain: Secondary | ICD-10-CM | POA: Diagnosis not present

## 2020-08-13 DIAGNOSIS — M799 Soft tissue disorder, unspecified: Secondary | ICD-10-CM | POA: Diagnosis not present

## 2020-08-13 DIAGNOSIS — R531 Weakness: Secondary | ICD-10-CM | POA: Diagnosis not present

## 2020-08-13 DIAGNOSIS — R262 Difficulty in walking, not elsewhere classified: Secondary | ICD-10-CM | POA: Diagnosis not present

## 2020-08-13 DIAGNOSIS — M2569 Stiffness of other specified joint, not elsewhere classified: Secondary | ICD-10-CM | POA: Diagnosis not present

## 2020-08-14 NOTE — Progress Notes (Signed)
Referring Provider: Rory Percy, MD Primary Care Physician:  Rory Percy, MD Primary GI Physician: Dr. Gala Romney  Chief Complaint  Patient presents with  . elevated lft  . Diarrhea    reports not having any    HPI:   Nicole Ewing is a 59 y.o. female presenting today for follow-up. She has history of GERD, elevated LFTs, daily alcohol use, history of intranasal drug use in her 20s, and diarrhea thought to be secondary to lactose intolerance.  Colonoscopy up-to-date in March 2021 with one hyperplastic polyp, otherwise normal exam with recommendations to repeat in 5 years.  Extensive evaluation of LFTs:  Evidence of prior hepatitis C with undetectable RNA indicating past/resolved infection.  Hepatitis B serologies negative.  GGT normal.  ASMA negative.  ANA positive.  AMA elevated at 173.  Notably, AST/ALT normalized in September 2020.  Alk phos remained slightly elevated but stable at 158.  MRI/MRCP was ordered to help evaluate bile ducts/PBC. MRI with no intra or extrahepatic biliary dilation.  Liver unremarkable other than small cyst.  Last seen in our office 05/16/2020.  She is recently placed on disability due to back and hip trouble.  GERD is well controlled on omeprazole daily.  Diarrhea resolved unless eating dairy products.  No other significant GI symptoms.  She had completed hepatitis B vaccination.  She was admitted to drinking 8-9 12 ounce beer a week.  Overall, suspected elevated LFTs likely secondary to alcohol abuse.  However, considering positive ANA and positive ANA, this raises concern for PBC, but alk phos was not 1.5 times upper limit of normal.  Plan to recheck HFP.  Discussed the importance of working towards abstinence of alcohol.  Consider starting on Urso if alk phos remained elevated as PBC can be seen with positive AMA and normal alk phos.   HFP completed 05/20/2020 was completely normal.  Recommended she continue to work toward abstinence and keep follow-up  appointment in 3 months with plans to recheck labs.  Today:  Drinking a couple 12 oz beer every other day. States her husband comes home in the evening and wants to drink, so she joins him. Takes Norco every 8 hours but no additional tylenol.  No swelling in the abdomen or lower extremities, confusion, jaundice, or bruising/bleeding.  No constipation or diarrhea. Having soft formed BMs daily. No blood in the stool or black stools.  GERD is well controlled with omeprazole. No dysphagia.   No abdominal pain, nausea or vomiting.  Per chart review, patient's weight has been slowly declining since October 2020 with total loss of 16 pounds, 10 of which have been within the last 6 months.  Eating as she normally does. Coffee and donuts in the morning, normal sized meal for lunch and dinner.  States some nights she actually eats "quite a bit".   Past Medical History:  Diagnosis Date  . Acid reflux   . Anxiety   . Arthritis   . Cervical cancer ([place])   . COPD (chronic obstructive pulmonary disease) ([place])   . Depression   . H/O: hysterectomy    Cervical dysplasia  . High cholesterol   . Hyperthyroidism   . Menopause ovarian failure   . Migraines     Past Surgical History:  Procedure Laterality Date  . ABDOMINAL HYSTERECTOMY    . BREAST SURGERY Right    breast buiopsy  . COLONOSCOPY  04/06/2007   [facility]:  Normal rectum, no inflammation, no colonic or rectal polyps, tortuous and  redundant colon. Recommendations to repeat TCS in 5 years (2013).   . COLONOSCOPY WITH PROPOFOL N/A 02/07/2020   Procedure: COLONOSCOPY WITH PROPOFOL;  Surgeon: Daneil Dolin, MD;  1 hyperplastic polyp. Otherwise normal exam. Recommended repeat in 5 years.   Marland Kitchen POLYPECTOMY  02/07/2020   Procedure: POLYPECTOMY;  Surgeon: Daneil Dolin, MD;  Location: [facility];  Service: Endoscopy;;  . TONSILLECTOMY    . TOTAL HIP ARTHROPLASTY Left 11/16/2018  . TOTAL HIP ARTHROPLASTY Right 07/02/2019    Current  Outpatient Medications  Medication Sig Dispense Refill  . acetaminophen (TYLENOL) 650 MG CR tablet Take 650 mg by mouth every 8 (eight) hours as needed for pain.    . Calcium Carbonate-Vitamin D (CALCIUM 600+D PO) Take by mouth daily.    . diazepam (VALIUM) 5 MG tablet Take 5 mg by mouth every 6 (six) hours as needed for anxiety.    Marland Kitchen HYDROcodone-acetaminophen (NORCO/VICODIN) 5-325 MG tablet Take 1 tablet by mouth every 8 (eight) hours as needed.    . Loratadine (CLARITIN ALLERGY CHILDRENS PO) Take 1 tablet by mouth daily.    . methocarbamol (ROBAXIN) 500 MG tablet Take 500 mg by mouth 4 (four) times daily.    . Multiple Vitamin (MULTIVITAMIN WITH MINERALS) TABS tablet Take 1 tablet by mouth daily.    Marland Kitchen omeprazole (PRILOSEC) 20 MG capsule Take 20 mg by mouth daily.    . Probiotic Product (PROBIOTIC DAILY PO) Take by mouth daily.    . vitamin B-12 (CYANOCOBALAMIN) 250 MCG tablet Take 250 mcg by mouth daily.     No current facility-administered medications for this visit.    Allergies as of 08/15/2020 - Review Complete 08/15/2020  Allergen Reaction Noted  . Tramadol  06/05/2019  . Codeine Rash 01/27/2017  . Keflex [cephalexin] Rash 01/27/2017    Family History  Problem Relation Age of Onset  . Melanoma Father   . Graves' disease Child   . Colon polyps Brother   . Colon cancer Neg Hx     Social History   Socioeconomic History  . Marital status: Married    Spouse name: Not on file  . Number of children: Not on file  . Years of education: Not on file  . Highest education level: Not on file  Occupational History  . Not on file  Tobacco Use  . Smoking status: Current Every Day Smoker    Packs/day: 0.25    Years: 25.00    Pack years: 6.25    Types: Cigars, Cigarettes  . Smokeless tobacco: Former Systems developer  . Tobacco comment: 1-2 cigars daily  Vaping Use  . Vaping Use: Never used  Substance and Sexual Activity  . Alcohol use: Yes    Comment: 4-6 beers daily (decresed to 2 every  other day -08/15/2020)  . Drug use: Not Currently  . Sexual activity: Yes    Birth control/protection: Surgical  Other Topics Concern  . Not on file  Social History Narrative  . Not on file   Social Determinants of Health   Financial Resource Strain:   . Difficulty of Paying Living Expenses: Not on file  Food Insecurity:   . Worried About Charity fundraiser in the Last Year: Not on file  . Ran Out of Food in the Last Year: Not on file  Transportation Needs:   . Lack of Transportation (Medical): Not on file  . Lack of Transportation (Non-Medical): Not on file  Physical Activity:   . Days of Exercise per  Week: Not on file  . Minutes of Exercise per Session: Not on file  Stress:   . Feeling of Stress : Not on file  Social Connections:   . Frequency of Communication with Friends and Family: Not on file  . Frequency of Social Gatherings with Friends and Family: Not on file  . Attends Religious Services: Not on file  . Active Member of Clubs or Organizations: Not on file  . Attends Archivist Meetings: Not on file  . Marital Status: Not on file    Review of Systems: Gen: Denies fever, chills, cold or flulike symptoms, presyncope or syncope.  Notes her hair continues to fall out which she has discussed with Dr. Dorris Fetch (endocrinologist).  Suspect this may be secondary to thyroid trouble. CV: Denies chest pain or palpitations. Resp: Denies dyspnea or cough. GI: See HPI.  Heme: See HPI  Physical Exam: BP 136/89   Pulse 66   Temp 97.8 F (36.6 C) (Oral)   Ht '5\' 5"'  (1.651 m)   Wt 129 lb 9.6 oz (58.8 kg)   BMI 21.57 kg/m  General:   Alert and oriented. No distress noted. Pleasant and cooperative.  Walking with a cane. Head:  Normocephalic and atraumatic. Eyes:  Conjuctiva clear without scleral icterus. Heart:  S1, S2 present without murmurs appreciated. Lungs:  Clear to auscultation bilaterally. No wheezes, rales, or rhonchi. No distress.  Abdomen:  +BS, soft,  non-tender and non-distended. No rebound or guarding. No HSM or masses noted. Msk:  Symmetrical without gross deformities. Normal posture. Extremities:  Without edema. Neurologic:  Alert and  oriented x4 Psych:  Normal mood and affect.

## 2020-08-15 ENCOUNTER — Ambulatory Visit (INDEPENDENT_AMBULATORY_CARE_PROVIDER_SITE_OTHER): Payer: 59 | Admitting: Gastroenterology

## 2020-08-15 ENCOUNTER — Encounter: Payer: Self-pay | Admitting: Gastroenterology

## 2020-08-15 ENCOUNTER — Other Ambulatory Visit: Payer: Self-pay

## 2020-08-15 VITALS — BP 136/89 | HR 66 | Temp 97.8°F | Ht 65.0 in | Wt 129.6 lb

## 2020-08-15 DIAGNOSIS — R634 Abnormal weight loss: Secondary | ICD-10-CM | POA: Diagnosis not present

## 2020-08-15 DIAGNOSIS — K219 Gastro-esophageal reflux disease without esophagitis: Secondary | ICD-10-CM | POA: Diagnosis not present

## 2020-08-15 DIAGNOSIS — R7989 Other specified abnormal findings of blood chemistry: Secondary | ICD-10-CM

## 2020-08-15 NOTE — Patient Instructions (Signed)
Have labs completed at [facility]   Continue working towards decreasing alcohol consumption as we discussed.   Continue omeprazole 20 mg daily.   As we discussed you weight has been slowly declining which could be secondary to your thyroid function. Please try to add an a couple extra snacks a day or protein shakes.   Keep your follow-up with Dr. Dorris Fetch.   We will follow-up in 4 months. Call with questions or concerns prior.   Aliene Altes, PA-C [facility]

## 2020-08-17 ENCOUNTER — Encounter: Payer: Self-pay | Admitting: Gastroenterology

## 2020-08-17 NOTE — Assessment & Plan Note (Addendum)
History of elevated LFTs noted in May 2020 with AST/ALT normalizing in September 2020.  Alk phos remains slightly elevated but stable in the 150s.  Most recent HFP completed June 2021 was within normal limits.  She had extensive evaluation thus far which has revealed history of hep C with undetectable RNA indicating past/resolved infection.  Hep B serologies negative s/p hep B vaccine.  GGT normal.  ASMA negative.  ANA positive with elevated ANA titer, ANA elevated at 173.  Ultrasound with fatty liver.  MRI/MRCP with no evidence of biliary dilation.  Patient has history of daily alcohol use and I suspect this to be the etiology of her elevated LFTs.  She has been working on decreasing alcohol consumption and is currently consuming about two 12 ounce beer every other day, down from 4-6 beer daily.   Plan: Repeat HFP to ensure stability. Counseled again on working towards abstinence of alcohol.  Patient states she can do this, she just tends to drink with her husband in the evenings.  She states she will continue to work on this. I previously considered trial of Urso had alk phos to remain elevated due to positive ANA and positive AMA with concern for possible PBC.  No need for this at this time. Plan to follow-up in 4 months.

## 2020-08-17 NOTE — Assessment & Plan Note (Signed)
Well-controlled on omeprazole 20 mg daily.  Advise she continue her current medications.  Follow-up in 4 months.

## 2020-08-17 NOTE — Assessment & Plan Note (Addendum)
Documented 16 pound weight loss in the last 13 months, 10 pound weight loss within the last 6 months.  Suspect this is multifactorial in the setting of hyperthyroidism as well as decreasing daily alcohol consumption from 4-6 beer daily to 2 beer every other day.  Patient has upcoming appointment with Dr. Dorris Fetch (endocrinologist).  CT A/P on file in December 2020 for follow-up on retropectoral adenopathy.  CT findings with several lymph nodes up to approximately 1 cm, none of which were particularly amendable to percutaneous biopsy given relative small size.  Impression stated unknown clinical significance.  She did have a focal area of thickening involving the anterior bladder wall.  Follow-up cystoscopy with urology was normal.  Suspected CT findings were secondary to bladder decompression with redundancy of urothelium.  I have advised patient to keep her follow-up appoint with Dr. Dorris Fetch regarding thyroid abnormalities.  We will continue to monitor weight loss.  Discussed the possibility of repeat CT in the future to follow-up on retroperitoneal adenopathy in light of ongoing weight loss although I do think her thyroid abnormalities and decrease alcohol consumption are the primary etiologies.  Patient states she prefers to hold off on any additional imaging at this time.  I recommended she had protein shakes or a couple high-protein snacks throughout the day. Plan follow-up in 4 months.

## 2020-08-22 DIAGNOSIS — R531 Weakness: Secondary | ICD-10-CM | POA: Diagnosis not present

## 2020-08-22 DIAGNOSIS — R262 Difficulty in walking, not elsewhere classified: Secondary | ICD-10-CM | POA: Diagnosis not present

## 2020-08-22 DIAGNOSIS — M545 Low back pain: Secondary | ICD-10-CM | POA: Diagnosis not present

## 2020-08-22 DIAGNOSIS — M799 Soft tissue disorder, unspecified: Secondary | ICD-10-CM | POA: Diagnosis not present

## 2020-08-22 DIAGNOSIS — M2569 Stiffness of other specified joint, not elsewhere classified: Secondary | ICD-10-CM | POA: Diagnosis not present

## 2020-08-28 ENCOUNTER — Other Ambulatory Visit: Payer: Self-pay

## 2020-08-28 ENCOUNTER — Other Ambulatory Visit (HOSPITAL_COMMUNITY)
Admission: RE | Admit: 2020-08-28 | Discharge: 2020-08-28 | Disposition: A | Discharge status: Home / Self Care | Payer: Medicare Other | Source: Ambulatory Visit | Attending: "Endocrinology | Admitting: "Endocrinology

## 2020-08-28 ENCOUNTER — Other Ambulatory Visit (HOSPITAL_COMMUNITY)
Admission: RE | Admit: 2020-08-28 | Discharge: 2020-08-28 | Disposition: A | Discharge status: Home / Self Care | Payer: Medicare Other | Source: Ambulatory Visit | Attending: Gastroenterology | Admitting: Gastroenterology

## 2020-08-28 DIAGNOSIS — E0520 Thyrotoxicosis with toxic multinodular goiter without thyrotoxic crisis or storm: Secondary | ICD-10-CM | POA: Diagnosis not present

## 2020-08-28 DIAGNOSIS — R7989 Other specified abnormal findings of blood chemistry: Secondary | ICD-10-CM | POA: Insufficient documentation

## 2020-08-28 LAB — T4, FREE: Free T4: 0.75 ng/dL (ref 0.61–1.12)

## 2020-08-28 LAB — HEPATIC FUNCTION PANEL
ALT: 14 U/L (ref 0–44)
AST: 22 U/L (ref 15–41)
Albumin: 4.1 g/dL (ref 3.5–5.0)
Alkaline Phosphatase: 72 U/L (ref 38–126)
Bilirubin, Direct: <0.1 mg/dL (ref 0.0–0.2)
Total Bilirubin: 0.5 mg/dL (ref 0.3–1.2)
Total Protein: 7.4 g/dL (ref 6.5–8.1)

## 2020-08-28 LAB — TSH: TSH: 1.11 u[IU]/mL (ref 0.350–4.500)

## 2020-08-28 NOTE — Progress Notes (Signed)
LFTs have remained within normal limits. She should continue working on limiting her alcohol intake. We will follow-up with her as scheduled.

## 2020-08-29 LAB — T3, FREE: T3, Free: 2.5 pg/mL (ref 2.0–4.4)

## 2020-09-02 ENCOUNTER — Encounter: Payer: Self-pay | Admitting: "Endocrinology

## 2020-09-02 ENCOUNTER — Telehealth (INDEPENDENT_AMBULATORY_CARE_PROVIDER_SITE_OTHER): Payer: 59 | Admitting: "Endocrinology

## 2020-09-02 VITALS — BP 123/71 | HR 66 | Ht 65.0 in | Wt 129.0 lb

## 2020-09-02 DIAGNOSIS — E0520 Thyrotoxicosis with toxic multinodular goiter without thyrotoxic crisis or storm: Secondary | ICD-10-CM | POA: Diagnosis not present

## 2020-09-02 NOTE — Progress Notes (Signed)
09/02/2020, 3:03 PM                        Endocrinology follow-up note  Subjective:    Patient ID: Nicole Ewing, female    DOB: Sep 23, 1961, PCP Rory Percy, MD   Past Medical History:  Diagnosis Date  . Acid reflux   . Anxiety   . Arthritis   . Cervical cancer ([place])   . COPD (chronic obstructive pulmonary disease) ([place])   . Depression   . H/O: hysterectomy    Cervical dysplasia  . High cholesterol   . Hyperthyroidism   . Menopause ovarian failure   . Migraines    Past Surgical History:  Procedure Laterality Date  . ABDOMINAL HYSTERECTOMY    . BREAST SURGERY Right    breast buiopsy  . COLONOSCOPY  04/06/2007   [facility]:  Normal rectum, no inflammation, no colonic or rectal polyps, tortuous and redundant colon. Recommendations to repeat TCS in 5 years (2013).   . COLONOSCOPY WITH PROPOFOL N/A 02/07/2020   Procedure: COLONOSCOPY WITH PROPOFOL;  Surgeon: Daneil Dolin, MD;  1 hyperplastic polyp. Otherwise normal exam. Recommended repeat in 5 years.   Marland Kitchen POLYPECTOMY  02/07/2020   Procedure: POLYPECTOMY;  Surgeon: Daneil Dolin, MD;  [facility];  Service: Endoscopy;;  . TONSILLECTOMY    . TOTAL HIP ARTHROPLASTY Left 11/16/2018  . TOTAL HIP ARTHROPLASTY Right 07/02/2019   Social History   Socioeconomic History  . Marital status: Married    Spouse name: Not on file  . Number of children: Not on file  . Years of education: Not on file  . Highest education level: Not on file  Occupational History  . Not on file  Tobacco Use  . Smoking status: Current Every Day Smoker    Packs/day: 0.25    Years: 25.00    Pack years: 6.25    Types: Cigars, Cigarettes  . Smokeless tobacco: Former Systems developer  . Tobacco comment: 1-2 cigars daily  Vaping Use  . Vaping Use: Never used  Substance and Sexual Activity  . Alcohol use: Yes    Comment: 4-6 beers daily (decresed to 2 every other day -08/15/2020)  . Drug  use: Not Currently  . Sexual activity: Yes    Birth control/protection: Surgical  Other Topics Concern  . Not on file  Social History Narrative  . Not on file   Social Determinants of Health   Financial Resource Strain:   . Difficulty of Paying Living Expenses: Not on file  Food Insecurity:   . Worried About Charity fundraiser in the Last Year: Not on file  . Ran Out of Food in the Last Year: Not on file  Transportation Needs:   . Lack of Transportation (Medical): Not on file  . Lack of Transportation (Non-Medical): Not on file  Physical Activity:   . Days of Exercise per Week: Not on file  . Minutes of Exercise per Session: Not on file  Stress:   . Feeling of Stress : Not on file  Social Connections:   . Frequency of Communication with Friends and Family: Not on file  . Frequency of Social Gatherings with  Friends and Family: Not on file  . Attends Religious Services: Not on file  . Active Member of Clubs or Organizations: Not on file  . Attends Archivist Meetings: Not on file  . Marital Status: Not on file   Family History  Problem Relation Age of Onset  . Melanoma Father   . Graves' disease Child   . Colon polyps Brother   . Colon cancer Neg Hx    Outpatient Encounter Medications as of 09/02/2020  Medication Sig  . Calcium Carbonate-Vitamin D (CALCIUM 600+D PO) Take by mouth daily.  . diazepam (VALIUM) 5 MG tablet Take 5 mg by mouth every 6 (six) hours as needed for anxiety.  Marland Kitchen HYDROcodone-acetaminophen (NORCO/VICODIN) 5-325 MG tablet Take 1 tablet by mouth every 8 (eight) hours as needed.  . Loratadine (CLARITIN ALLERGY CHILDRENS PO) Take 1 tablet by mouth daily.  . methocarbamol (ROBAXIN) 500 MG tablet Take 500 mg by mouth 4 (four) times daily.  . Multiple Vitamin (MULTIVITAMIN WITH MINERALS) TABS tablet Take 1 tablet by mouth daily.  Marland Kitchen omeprazole (PRILOSEC) 20 MG capsule Take 20 mg by mouth daily.  . Probiotic Product (PROBIOTIC DAILY PO) Take by mouth  daily.  . vitamin B-12 (CYANOCOBALAMIN) 250 MCG tablet Take 250 mcg by mouth daily.  . [DISCONTINUED] acetaminophen (TYLENOL) 650 MG CR tablet Take 650 mg by mouth every 8 (eight) hours as needed for pain.   No facility-administered encounter medications on file as of 09/02/2020.   ALLERGIES: Allergies  Allergen Reactions  . Tramadol   . Codeine Rash  . Keflex [Cephalexin] Rash    VACCINATION STATUS:  There is no immunization history on file for this patient.  HPI Nicole Ewing is 59 y.o. female who is seen in follow-up after she was seen in consultation for hyperthyroidism. PMD: Rory Percy, MD. She was diagnosed with hyperthyroidism in May 2020 when she was treated with methimazole 10 mg p.o. daily before it was discontinued during her last visit, for adequate treatment response. -She has no new complaints today.    -Her thyroid uptake and scan on October 23, 2019 showed 25.6% 24-hour uptake.  The thyroid  Scan was reported to be inhomogeneous with relative increased tracer accumulation right superior and left inferior lobes and relative decreased uptake at the right inferior pole suggesting presence of at least one thyroid nodule and ultrasound of the thyroid was suggested.  She underwent fine-needle biopsy of this nodule with benign findings.   Currently, she denies palpitations, heat intolerance, tremors.  She does have family history of thyroid dysfunction in her daughter.  She complains of continued hair loss.  She remains active smoker.  She denies dysphagia, shortness of breath, nor voice change. She admits to upto 6 beers daily.  Review of Systems Limited as above.  Objective:    BP 123/71   Pulse 66   Ht 5\' 5"  (1.651 m)   Wt 129 lb (58.5 kg)   BMI 21.47 kg/m   Wt Readings from Last 3 Encounters:  09/02/20 129 lb (58.5 kg)  08/15/20 129 lb 9.6 oz (58.8 kg)  05/16/20 131 lb 3.2 oz (59.5 kg)    Physical Exam  CMP ( most recent) CMP     Component Value  Date/Time   NA 142 06/05/2019 1446   K 3.9 06/05/2019 1446   CL 107 06/05/2019 1446   CO2 24 06/05/2019 1446   GLUCOSE 110 06/05/2019 1446   BUN 5 (L) 06/05/2019 1446   BUN 7  04/26/2019 0000   CREATININE 0.57 12/04/2019 1353   CREATININE 0.43 (L) 06/05/2019 1446   CALCIUM 9.8 06/05/2019 1446   PROT 7.4 08/28/2020 1214   ALBUMIN 4.1 08/28/2020 1214   AST 22 08/28/2020 1214   ALT 14 08/28/2020 1214   ALKPHOS 72 08/28/2020 1214   BILITOT 0.5 08/28/2020 1214   GFRNONAA >60 12/04/2019 1353   GFRNONAA 112 06/05/2019 1446   GFRAA >60 12/04/2019 1353   GFRAA 130 06/05/2019 1446    Lipid Panel     Component Value Date/Time   CHOL 188 04/26/2019 0000   TRIG 92 04/26/2019 0000   HDL 68 04/26/2019 0000   LDLCALC 102 04/26/2019 0000      Lab Results  Component Value Date   TSH 1.110 08/28/2020   TSH 0.543 02/25/2020   TSH 0.95 09/26/2019   TSH 0.01 (A) 04/26/2019   TSH 0.01 (A) 04/26/2019   FREET4 0.75 08/28/2020   FREET4 0.69 02/25/2020   FREET4 0.7 (L) 09/26/2019      Assessment & Plan:   1. Hyperthyroidism-controlled.  -Based on her previsit thyroid function tests which are within normal limits and consistent with euthyroid state, she will not need any antithyroid intervention at this time.  She will have repeat thyroid function test and office visit in 6 months.   Given her history of alcoholism, long-term methimazole  therapy is  unsafe for her. -If she has relapse of hyperthyroidism, she will be considered for I-131 thyroid ablation. -She has a negative fine-needle aspiration of a thyroid nodule.   The patient was counseled on the dangers of tobacco use, and was advised to quit.  Reviewed strategies to maximize success, including removing cigarettes and smoking materials from environment.  -Her recent labs showed correction of her previously documented hyper transaminases.  I did not initiate any new prescriptions today.  - I advised her  to maintain close follow up  with Rory Percy, MD for primary care needs.      - Time spent on this patient care encounter:  20 minutes of which 50% was spent in  counseling and the rest reviewing  her current and  previous labs / studies and medications  doses and developing a plan for long term care. Salomon Mast Timmins  participated in the discussions, expressed understanding, and voiced agreement with the above plans.  All questions were answered to her satisfaction. she is encouraged to contact clinic should she have any questions or concerns prior to her return visit.    Follow up plan: Return in about 6 months (around 03/02/2021) for F/U with Pre-visit Labs.   Glade Lloyd, MD [facility] [address] [place], [place] [address] Phone: [phone]  Fax: [phone]     09/02/2020, 3:02 PM  This note was partially dictated with voice recognition software. Similar sounding words can be transcribed inadequately or may not  be corrected upon review.

## 2020-09-09 DIAGNOSIS — Z96642 Presence of left artificial hip joint: Secondary | ICD-10-CM | POA: Diagnosis not present

## 2020-09-09 DIAGNOSIS — M47816 Spondylosis without myelopathy or radiculopathy, lumbar region: Secondary | ICD-10-CM | POA: Diagnosis not present

## 2020-09-09 DIAGNOSIS — Z96641 Presence of right artificial hip joint: Secondary | ICD-10-CM | POA: Diagnosis not present

## 2020-09-17 DIAGNOSIS — M47816 Spondylosis without myelopathy or radiculopathy, lumbar region: Secondary | ICD-10-CM | POA: Diagnosis not present

## 2020-10-02 DIAGNOSIS — M47816 Spondylosis without myelopathy or radiculopathy, lumbar region: Secondary | ICD-10-CM | POA: Diagnosis not present

## 2020-10-02 DIAGNOSIS — M5136 Other intervertebral disc degeneration, lumbar region: Secondary | ICD-10-CM | POA: Diagnosis not present

## 2020-11-03 DIAGNOSIS — M47816 Spondylosis without myelopathy or radiculopathy, lumbar region: Secondary | ICD-10-CM | POA: Diagnosis not present

## 2020-11-03 DIAGNOSIS — Z79891 Long term (current) use of opiate analgesic: Secondary | ICD-10-CM | POA: Diagnosis not present

## 2020-11-03 DIAGNOSIS — M7918 Myalgia, other site: Secondary | ICD-10-CM | POA: Diagnosis not present

## 2020-11-03 DIAGNOSIS — Z5181 Encounter for therapeutic drug level monitoring: Secondary | ICD-10-CM | POA: Diagnosis not present

## 2020-11-03 DIAGNOSIS — M5136 Other intervertebral disc degeneration, lumbar region: Secondary | ICD-10-CM | POA: Diagnosis not present

## 2020-12-13 NOTE — Progress Notes (Deleted)
Referring Provider: Rory Percy, MD Primary Care Physician:  Rory Percy, MD Primary GI Physician: Dr. Gala Romney  No chief complaint on file.   HPI:   Nicole Ewing is a 60 y.o. female presenting today with a history of GERD, diarrhea secondary to lactose intolerance, hyperplastic colon polyp due for colonoscopy in March 2026,  elevated LFTs, and regular alcohol use. LFTs were noted to be elevated in May 2020 with AST/ALT normalizing in September 2020, but alk phos remains slightly elevated in the one fifties. HFP in June 2021 within normal limits. Prior evaluation revealed history of hep C with undetectable RNA indicating past/resolved infection, hep B serologies negative s/p hep B vaccine, GGT normal, ASMA negative, ANA positive with elevated ANA titer, AMA elevated at 173. Ultrasound fatty liver. MRI/MRCP with no evidence of biliary abnormality. It has been suspected that chronic alcohol use is likely the etiology of elevated LFTs.   Last seen in our office 08/17/20. GERD well controlled on omeprazole 20 mg daily. She had been working on decreasing alcohol. She was down from 4-6 beers daily to two 12 ounce beer every other day. No signs or symptoms of decompensated liver disease. Plan to update HFP to ensure her LFTs remained within normal limits and counseled extensively on the importance of alcohol cessation. Also noted 16 pound weight loss over the last year which was suspected to be multifactorial in the setting of hyperthyroidism and decreasing alcohol consumption. CT A/P on file from December 2020 to follow-up on retroperitoneal adenopathy with several lymph nodes up to approximately 1 cm, none of which were particularly amenable to percutaneous biopsy given relative small size. Impression stated unknown clinical significance. Discussed repeat CT in the future to follow-up on retroperitoneal adenopathy. Patient preferred to hold off on additional imaging. I recommended adding protein  shakes a couple of high-protein snacks throughout the day and close follow-up with Dr. Dorris Fetch. Follow-up in 4 months.  HFP 08/28/20: Within normal limits.  Today:  Weight loss:  Elevated LFTs: Discuss liver biopsy.   GERD:   ?biopsy, immunoglobulins,   Past Medical History:  Diagnosis Date  . Acid reflux   . Anxiety   . Arthritis   . Cervical cancer ([place])   . COPD (chronic obstructive pulmonary disease) ([place])   . Depression   . H/O: hysterectomy    Cervical dysplasia  . High cholesterol   . Hyperthyroidism   . Menopause ovarian failure   . Migraines     Past Surgical History:  Procedure Laterality Date  . ABDOMINAL HYSTERECTOMY    . BREAST SURGERY Right    breast buiopsy  . COLONOSCOPY  04/06/2007   [facility]:  Normal rectum, no inflammation, no colonic or rectal polyps, tortuous and redundant colon. Recommendations to repeat TCS in 5 years (2013).   . COLONOSCOPY WITH PROPOFOL N/A 02/07/2020   Procedure: COLONOSCOPY WITH PROPOFOL;  Surgeon: Daneil Dolin, MD;  1 hyperplastic polyp. Otherwise normal exam. Recommended repeat in 5 years.   Marland Kitchen POLYPECTOMY  02/07/2020   Procedure: POLYPECTOMY;  Surgeon: Daneil Dolin, MD;  [facility];  Service: Endoscopy;;  . TONSILLECTOMY    . TOTAL HIP ARTHROPLASTY Left 11/16/2018  . TOTAL HIP ARTHROPLASTY Right 07/02/2019    Current Outpatient Medications  Medication Sig Dispense Refill  . Calcium Carbonate-Vitamin D (CALCIUM 600+D PO) Take by mouth daily.    . diazepam (VALIUM) 5 MG tablet Take 5 mg by mouth every 6 (six) hours as needed for  anxiety.    Marland Kitchen HYDROcodone-acetaminophen (NORCO/VICODIN) 5-325 MG tablet Take 1 tablet by mouth every 8 (eight) hours as needed.    . Loratadine (CLARITIN ALLERGY CHILDRENS PO) Take 1 tablet by mouth daily.    . methocarbamol (ROBAXIN) 500 MG tablet Take 500 mg by mouth 4 (four) times daily.    . Multiple Vitamin (MULTIVITAMIN WITH MINERALS) TABS tablet Take 1 tablet by mouth  daily.    Marland Kitchen omeprazole (PRILOSEC) 20 MG capsule Take 20 mg by mouth daily.    . Probiotic Product (PROBIOTIC DAILY PO) Take by mouth daily.    . vitamin B-12 (CYANOCOBALAMIN) 250 MCG tablet Take 250 mcg by mouth daily.     No current facility-administered medications for this visit.    Allergies as of 12/15/2020 - Review Complete 09/02/2020  Allergen Reaction Noted  . Tramadol  06/05/2019  . Codeine Rash 01/27/2017  . Keflex [cephalexin] Rash 01/27/2017    Family History  Problem Relation Age of Onset  . Melanoma Father   . Graves' disease Child   . Colon polyps Brother   . Colon cancer Neg Hx     Social History   Socioeconomic History  . Marital status: Married    Spouse name: Not on file  . Number of children: Not on file  . Years of education: Not on file  . Highest education level: Not on file  Occupational History  . Not on file  Tobacco Use  . Smoking status: Current Every Day Smoker    Packs/day: 0.25    Years: 25.00    Pack years: 6.25    Types: Cigars, Cigarettes  . Smokeless tobacco: Former Systems developer  . Tobacco comment: 1-2 cigars daily  Vaping Use  . Vaping Use: Never used  Substance and Sexual Activity  . Alcohol use: Yes    Comment: 4-6 beers daily (decresed to 2 every other day -08/15/2020)  . Drug use: Not Currently  . Sexual activity: Yes    Birth control/protection: Surgical  Other Topics Concern  . Not on file  Social History Narrative  . Not on file   Social Determinants of Health   Financial Resource Strain: Not on file  Food Insecurity: Not on file  Transportation Needs: Not on file  Physical Activity: Not on file  Stress: Not on file  Social Connections: Not on file    Review of Systems: Gen: Denies fever, chills, anorexia. Denies fatigue, weakness, weight loss.  CV: Denies chest pain, palpitations, syncope, peripheral edema, and claudication. Resp: Denies dyspnea at rest, cough, wheezing, coughing up blood, and pleurisy. GI: Denies  vomiting blood, jaundice, and fecal incontinence.   Denies dysphagia or odynophagia. Derm: Denies rash, itching, dry skin Psych: Denies depression, anxiety, memory loss, confusion. No homicidal or suicidal ideation.  Heme: Denies bruising, bleeding, and enlarged lymph nodes.  Physical Exam: There were no vitals taken for this visit. General:   Alert and oriented. No distress noted. Pleasant and cooperative.  Head:  Normocephalic and atraumatic. Eyes:  Conjuctiva clear without scleral icterus. Mouth:  Oral mucosa pink and moist. Good dentition. No lesions. Heart:  S1, S2 present without murmurs appreciated. Lungs:  Clear to auscultation bilaterally. No wheezes, rales, or rhonchi. No distress.  Abdomen:  +BS, soft, non-tender and non-distended. No rebound or guarding. No HSM or masses noted. Msk:  Symmetrical without gross deformities. Normal posture. Extremities:  Without edema. Neurologic:  Alert and  oriented x4 Psych:  Alert and cooperative. Normal mood and affect.

## 2020-12-15 ENCOUNTER — Ambulatory Visit: Payer: 59 | Admitting: Gastroenterology

## 2020-12-25 DIAGNOSIS — L989 Disorder of the skin and subcutaneous tissue, unspecified: Secondary | ICD-10-CM | POA: Diagnosis not present

## 2020-12-25 DIAGNOSIS — M545 Low back pain, unspecified: Secondary | ICD-10-CM | POA: Diagnosis not present

## 2020-12-25 DIAGNOSIS — G8929 Other chronic pain: Secondary | ICD-10-CM | POA: Diagnosis not present

## 2020-12-25 DIAGNOSIS — R7989 Other specified abnormal findings of blood chemistry: Secondary | ICD-10-CM | POA: Diagnosis not present

## 2020-12-25 DIAGNOSIS — Z6821 Body mass index (BMI) 21.0-21.9, adult: Secondary | ICD-10-CM | POA: Diagnosis not present

## 2020-12-25 DIAGNOSIS — M25559 Pain in unspecified hip: Secondary | ICD-10-CM | POA: Diagnosis not present

## 2020-12-25 DIAGNOSIS — K219 Gastro-esophageal reflux disease without esophagitis: Secondary | ICD-10-CM | POA: Diagnosis not present

## 2021-01-25 NOTE — Progress Notes (Deleted)
Referring Provider: Rory Percy, MD Primary Care Physician:  Rory Percy, MD Primary GI Physician: Dr. Gala Romney  No chief complaint on file.   HPI:   Nicole Ewing is a 60 y.o. female with history of GERD, diarrhea with resolution with avoiding dairy products, colonoscopy up-to-date in March 2021 with recommendations to repeat in 5 years.  Also with elevated LFTs noted in May 2020.  AST/ALT normalized in September 2020, alk phos later normalized in June 2021.  Suspected elevation likely secondary to daily alcohol use.  Extensive evaluation for elevated LFTs revealing history of hep C with undetectable RNA indicating past/resolved infection, hep B serologies negative s/p hep B vaccine, GGT normal, ASMA negative, ANA positive with elevated ANA titer, AMA elevated at 173.  Ultrasound with fatty liver.  MRI/MRCP with no evidence of biliary dilation.  Due to LFTs normalizing, Urso was not started.  Also diagnosed with hyperthyroidism in 2020.  She was treated with methimazole with adequate response.  His medication was discontinued due to ongoing alcohol use.  Last TSH September 2021 within normal limits.  Last seen in our office 08/15/2020.  Reported drinking a couple 12 ounce beer every other day.  Also taking Norco every 8 hours, but no additional Tylenol.  No signs or symptoms of decompensated liver disease.  Bowels moving well without constipation, diarrhea, or alarm symptoms.  GERD well controlled on omeprazole 20 mg daily without dysphagia.  No other significant upper or lower GI symptoms.  She was noted to have slow decline in her weight since October 2020 with total of 16 pound weight loss, 10 pound weight loss over the last 6 months.  No change in her appetite or typical intake.  Suspected weight loss was multifactorial in the setting of hyperthyroidism and decreased daily alcohol consumption.  Prior CT in December 2020 for follow-up on retroperitoneal adenopathy with several lymph nodes up  to approximately 1 cm, but not amenable to percutaneous biopsy due to small size.  She has been advised to follow-up with PCP for further sets of monitoring and evaluation.  There was an area of focal thickening in the anterior bladder wall.had been evaluated by cystoscopy and found to be normal.  Discussed the possibility of repeat CT to follow-up on adenopathy in light of ongoing weight loss, but patient declined.  Recommended keeping follow-up with Dr. Dorris Fetch and adding protein shakes or high-protein snacks twice daily.  Continue to work on alcohol cessation, update HFP, continue omeprazole, follow-up in 4 months.  HFP 08/28/2020 with AST 22, ALT 14, alk phos 72.  Patient canceled her follow-up in January 2022.  Today:  GERD:  Weight loss: Consider referral to hematology for lymphadenopathy.  Weights: 09/06/2019: 145 LBS 02/28/2020: 139 LBS 08/15/2020: 129 LBS 01/26/2021:  Elevated LFTs: Repeat HFP in March.    Past Medical History:  Diagnosis Date  . Acid reflux   . Anxiety   . Arthritis   . Cervical cancer ([place])   . COPD (chronic obstructive pulmonary disease) ([place])   . Depression   . H/O: hysterectomy    Cervical dysplasia  . High cholesterol   . Hyperthyroidism   . Menopause ovarian failure   . Migraines     Past Surgical History:  Procedure Laterality Date  . ABDOMINAL HYSTERECTOMY    . BREAST SURGERY Right    breast buiopsy  . COLONOSCOPY  04/06/2007   [facility]:  Normal rectum, no inflammation, no colonic or rectal polyps, tortuous and redundant colon. Recommendations  to repeat TCS in 5 years (2013).   . COLONOSCOPY WITH PROPOFOL N/A 02/07/2020   Procedure: COLONOSCOPY WITH PROPOFOL;  Surgeon: Daneil Dolin, MD;  1 hyperplastic polyp. Otherwise normal exam. Recommended repeat in 5 years.   Marland Kitchen POLYPECTOMY  02/07/2020   Procedure: POLYPECTOMY;  Surgeon: Daneil Dolin, MD;  [facility];  Service: Endoscopy;;  . TONSILLECTOMY    . TOTAL HIP  ARTHROPLASTY Left 11/16/2018  . TOTAL HIP ARTHROPLASTY Right 07/02/2019    Current Outpatient Medications  Medication Sig Dispense Refill  . Calcium Carbonate-Vitamin D (CALCIUM 600+D PO) Take by mouth daily.    . diazepam (VALIUM) 5 MG tablet Take 5 mg by mouth every 6 (six) hours as needed for anxiety.    Marland Kitchen HYDROcodone-acetaminophen (NORCO/VICODIN) 5-325 MG tablet Take 1 tablet by mouth every 8 (eight) hours as needed.    . Loratadine (CLARITIN ALLERGY CHILDRENS PO) Take 1 tablet by mouth daily.    . methocarbamol (ROBAXIN) 500 MG tablet Take 500 mg by mouth 4 (four) times daily.    . Multiple Vitamin (MULTIVITAMIN WITH MINERALS) TABS tablet Take 1 tablet by mouth daily.    Marland Kitchen omeprazole (PRILOSEC) 20 MG capsule Take 20 mg by mouth daily.    . Probiotic Product (PROBIOTIC DAILY PO) Take by mouth daily.    . vitamin B-12 (CYANOCOBALAMIN) 250 MCG tablet Take 250 mcg by mouth daily.     No current facility-administered medications for this visit.    Allergies as of 01/26/2021 - Review Complete 09/02/2020  Allergen Reaction Noted  . Tramadol  06/05/2019  . Codeine Rash 01/27/2017  . Keflex [cephalexin] Rash 01/27/2017    Family History  Problem Relation Age of Onset  . Melanoma Father   . Graves' disease Child   . Colon polyps Brother   . Colon cancer Neg Hx     Social History   Socioeconomic History  . Marital status: Married    Spouse name: Not on file  . Number of children: Not on file  . Years of education: Not on file  . Highest education level: Not on file  Occupational History  . Not on file  Tobacco Use  . Smoking status: Current Every Day Smoker    Packs/day: 0.25    Years: 25.00    Pack years: 6.25    Types: Cigars, Cigarettes  . Smokeless tobacco: Former Systems developer  . Tobacco comment: 1-2 cigars daily  Vaping Use  . Vaping Use: Never used  Substance and Sexual Activity  . Alcohol use: Yes    Comment: 4-6 beers daily (decresed to 2 every other day -08/15/2020)   . Drug use: Not Currently  . Sexual activity: Yes    Birth control/protection: Surgical  Other Topics Concern  . Not on file  Social History Narrative  . Not on file   Social Determinants of Health   Financial Resource Strain: Not on file  Food Insecurity: Not on file  Transportation Needs: Not on file  Physical Activity: Not on file  Stress: Not on file  Social Connections: Not on file    Review of Systems: Gen: Denies fever, chills, anorexia. Denies fatigue, weakness, weight loss.  CV: Denies chest pain, palpitations, syncope, peripheral edema, and claudication. Resp: Denies dyspnea at rest, cough, wheezing, coughing up blood, and pleurisy. GI: Denies vomiting blood, jaundice, and fecal incontinence.   Denies dysphagia or odynophagia. Derm: Denies rash, itching, dry skin Psych: Denies depression, anxiety, memory loss, confusion. No homicidal or  suicidal ideation.  Heme: Denies bruising, bleeding, and enlarged lymph nodes.  Physical Exam: There were no vitals taken for this visit. General:   Alert and oriented. No distress noted. Pleasant and cooperative.  Head:  Normocephalic and atraumatic. Eyes:  Conjuctiva clear without scleral icterus. Mouth:  Oral mucosa pink and moist. Good dentition. No lesions. Heart:  S1, S2 present without murmurs appreciated. Lungs:  Clear to auscultation bilaterally. No wheezes, rales, or rhonchi. No distress.  Abdomen:  +BS, soft, non-tender and non-distended. No rebound or guarding. No HSM or masses noted. Msk:  Symmetrical without gross deformities. Normal posture. Extremities:  Without edema. Neurologic:  Alert and  oriented x4 Psych:  Alert and cooperative. Normal mood and affect.

## 2021-01-26 ENCOUNTER — Ambulatory Visit: Payer: 59 | Admitting: Gastroenterology

## 2021-02-19 ENCOUNTER — Telehealth: Payer: Self-pay | Admitting: Internal Medicine

## 2021-02-19 NOTE — Telephone Encounter (Signed)
Pt has question about her lab orders. Please call [phone]

## 2021-02-20 ENCOUNTER — Other Ambulatory Visit: Payer: Self-pay

## 2021-02-20 DIAGNOSIS — R7989 Other specified abnormal findings of blood chemistry: Secondary | ICD-10-CM

## 2021-02-20 NOTE — Telephone Encounter (Signed)
Yes, we have have her complete HFP to ensure stability of LFTs. Dx: History of elevated LFTs

## 2021-02-20 NOTE — Telephone Encounter (Signed)
Noted. Spoke with pt and she is aware. Lab orders placed and faxed to [facility] per pts request.

## 2021-02-20 NOTE — Telephone Encounter (Signed)
Spoke with pt. Pt is having labs completed with Dr. Liliane Channel office and wanted to know if there is lab work needed prior to her apt. Pt states we usually send her for labs at the time of her appointment but she would like to avoid getting stuck twice. Apt is 03/02/2021

## 2021-02-23 ENCOUNTER — Other Ambulatory Visit (HOSPITAL_COMMUNITY)
Admission: RE | Admit: 2021-02-23 | Discharge: 2021-02-23 | Disposition: A | Discharge status: Home / Self Care | Payer: Medicare Other | Source: Ambulatory Visit | Attending: Gastroenterology | Admitting: Gastroenterology

## 2021-02-23 ENCOUNTER — Other Ambulatory Visit (HOSPITAL_COMMUNITY)
Admission: RE | Admit: 2021-02-23 | Discharge: 2021-02-23 | Disposition: A | Discharge status: Home / Self Care | Payer: Medicare Other | Source: Ambulatory Visit | Attending: "Endocrinology | Admitting: "Endocrinology

## 2021-02-23 DIAGNOSIS — E0520 Thyrotoxicosis with toxic multinodular goiter without thyrotoxic crisis or storm: Secondary | ICD-10-CM | POA: Insufficient documentation

## 2021-02-23 DIAGNOSIS — R7989 Other specified abnormal findings of blood chemistry: Secondary | ICD-10-CM | POA: Insufficient documentation

## 2021-02-23 LAB — HEPATIC FUNCTION PANEL
ALT: 12 U/L (ref 0–44)
AST: 21 U/L (ref 15–41)
Albumin: 3.9 g/dL (ref 3.5–5.0)
Alkaline Phosphatase: 64 U/L (ref 38–126)
Bilirubin, Direct: <0.1 mg/dL (ref 0.0–0.2)
Total Bilirubin: 0.4 mg/dL (ref 0.3–1.2)
Total Protein: 7.1 g/dL (ref 6.5–8.1)

## 2021-02-23 LAB — TSH: TSH: 0.593 u[IU]/mL (ref 0.350–4.500)

## 2021-02-23 LAB — T4, FREE: Free T4: 0.86 ng/dL (ref 0.61–1.12)

## 2021-02-24 LAB — T3, FREE: T3, Free: 2.5 pg/mL (ref 2.0–4.4)

## 2021-02-28 NOTE — Progress Notes (Signed)
Referring Provider: Rory Percy, MD Primary Care Physician:  Rory Percy, MD Primary GI Physician: Dr. Gala Romney  Chief Complaint  Patient presents with   elevated lft   Gastroesophageal Reflux    Doing okay    HPI:   Nicole Ewing is a 60 y.o. female presenting today for follow-up. She has history of GERD, elevated LFTs, history of daily alcohol use, history of intranasal drug use in her 43s, and diarrhea thought to be secondary to lactose intolerance.  Colonoscopy up-to-date in March 2021 with one hyperplastic polyp, otherwise normal exam with recommendations to repeat in 5 years.  Extensive evaluation of LFTs: Evidence of prior hepatitis C with undetectable RNA indicating past/resolved infection. Hepatitis B serologies negative. GGT normal. ASMA negative. ANA positive. AMA elevated at 173. Notably, AST/ALT normalized in September 2020. Alk phos remained slightly elevated but stable at 158. MRI/MRCP was ordered to help evaluate bile ducts/PBC. MRI with no intra or extrahepatic biliary dilation. Liver unremarkable other than small cyst.  Last seen in our office 08/15/2020.  She was working on decreasing alcohol use and was drinking a couple 12 ounce beer every other day.  This was due to her husband continuing to drink alcohol, so she wanted to join him.  Also takes Norco every 8 hours but no additional Tylenol.  No signs or symptoms of decompensated liver disease.  GERD was well controlled on omeprazole.  Bowels are moving well without alarm symptoms.  Noted 10 pound weight loss over the last 6 months and 15 pound weight loss over the last 13 months this was unintentional and patient states she had not changed her dietary intake at all.  Suspected weight loss was multifactorial in the setting of hyperthyroidism and decreasing daily alcohol use.  CT A/P on file from December 2020 with several lymph nodes up to 1 cm, none of which were particularly amendable to percutaneous biopsy  given relative small size.  Unknown clinical significance.  Discussed possibility of repeat CT.  Patient preferred to hold off on additional imaging.  Recommended adding protein shakes or couple of high-protein snacks throughout the day.  Otherwise, continue current medications, continue working towards abstinence of alcohol, and repeat HFP to ensure stability.  We will follow-up in 4 months.  HFP within normal limits 08/28/2020.  Most recent HFP 02/23/2021 within normal limits.  Today:  Elevated LFTs:  Stopped drinking in January 2022.  No yellowing of eyes or skin, swelling in abdomen or LE, confusion or change in mental status, or bruising/bleeding.   GERD: Well controlled on omeprazole 20 mg daily.   Reports weight has been stable lately. No nausea, vomiting, or early satiety. Drinks water and coffee. Stays thirsty. Dr. Dorris Fetch called her in a glucose meter. Intermittent episodes of feeling shaky and like she will pass out. Dr. Dorris Fetch considering Graves disease and possible intermittent hypoglycemia.   Pain in the epigastric area for 2-3 months. Also feels like there is a "knot" in this area.  Intermittent. Can be triggered by certain movements. Not usually triggered by meals but can't be sure about this. Admits to solid food dysphagia as well. Feels foods get hung in lower esophagus/subxiphoid area. With this, she has discomfort. Symptoms will eventually resolve on their own. No regurgitation. No trouble with soft foods or liquids. No prior EGD.  Admits to intermittent dark/black stools once every week or two x 6 months. Taking Centrum Women's 50+ vitamin daily which does contain low dose iron.  Just started taking  Aleve as needed last Friday due to right-sided back pain that is now radiating to the right upper quadrant which is worse with coughing or taking a deep breath.   Denies association with meals or urinary symptoms.  Denies falling or lifting anything heavy.   Intermittent constipation in  the setting of chronic pain medications. Uses MiraLAX as needed which works most of the time, but wondering if there is something she can take daily. No blood in the stool.   Weight loss: 06/05/2019: 144 LBS 11/08/2019: 141 LBS 05/16/2020: 131 LBS 09/02/2020: 129 LBS 03/02/2021:  130 LBS  Past Medical History:  Diagnosis Date   Acid reflux    Anxiety    Arthritis    Cervical cancer (HCC)    COPD (chronic obstructive pulmonary disease) ([place])    Depression    H/O: hysterectomy    Cervical dysplasia   High cholesterol    Hyperthyroidism    Menopause ovarian failure    Migraines     Past Surgical History:  Procedure Laterality Date   ABDOMINAL HYSTERECTOMY     BREAST SURGERY Right    breast buiopsy   COLONOSCOPY  04/06/2007   [facility]:  Normal rectum, no inflammation, no colonic or rectal polyps, tortuous and redundant colon. Recommendations to repeat TCS in 5 years (2013).    COLONOSCOPY WITH PROPOFOL N/A 02/07/2020   Procedure: COLONOSCOPY WITH PROPOFOL;  Surgeon: Daneil Dolin, MD;  1 hyperplastic polyp. Otherwise normal exam. Recommended repeat in 5 years.    POLYPECTOMY  02/07/2020   Procedure: POLYPECTOMY;  Surgeon: Daneil Dolin, MD;  [facility];  Service: Endoscopy;;   TONSILLECTOMY     TOTAL HIP ARTHROPLASTY Left 11/16/2018   TOTAL HIP ARTHROPLASTY Right 07/02/2019    Current Outpatient Medications  Medication Sig Dispense Refill   Blood Glucose Monitoring Suppl (ACCU-CHEK GUIDE ME) w/Device KIT 1 Piece by Does not apply route as directed. 1 kit 0   Calcium Carbonate-Vitamin D (CALCIUM 600+D PO) Take by mouth daily.     diazepam (VALIUM) 5 MG tablet Take 5 mg by mouth every 6 (six) hours as needed for anxiety.     glucose blood (ACCU-CHEK GUIDE) test strip Only when you get symptoms of low glucose 50 each 2   HYDROcodone-acetaminophen (NORCO/VICODIN) 5-325 MG tablet Take 1 tablet by mouth every 8 (eight) hours as needed.      Lancets MISC 1 each by Does not apply route as directed. 100 each 3   Loratadine (CLARITIN ALLERGY CHILDRENS PO) Take 1 tablet by mouth daily.     methocarbamol (ROBAXIN) 500 MG tablet Take 500 mg by mouth 4 (four) times daily.     Multiple Vitamin (MULTIVITAMIN WITH MINERALS) TABS tablet Take 1 tablet by mouth daily.     naproxen sodium (ALEVE) 220 MG tablet Take 220 mg by mouth. As needed     omeprazole (PRILOSEC) 20 MG capsule Take 20 mg by mouth daily.     Probiotic Product (PROBIOTIC DAILY PO) Take by mouth daily.     vitamin B-12 (CYANOCOBALAMIN) 250 MCG tablet Take 250 mcg by mouth daily.     No current facility-administered medications for this visit.    Allergies as of 03/02/2021 - Review Complete 03/02/2021  Allergen Reaction Noted   Tramadol  06/05/2019   Codeine Rash 01/27/2017   Keflex [cephalexin] Rash 01/27/2017    Family History  Problem Relation Age of Onset   Melanoma Father    Berenice Primas' disease Child  Colon polyps Brother    Colon cancer Neg Hx     Social History   Socioeconomic History   Marital status: Married    Spouse name: Not on file   Number of children: Not on file   Years of education: Not on file   Highest education level: Not on file  Occupational History   Not on file  Tobacco Use   Smoking status: Current Every Day Smoker    Packs/day: 0.25    Years: 25.00    Pack years: 6.25    Types: Cigars, Cigarettes   Smokeless tobacco: Former Systems developer   Tobacco comment: 1-2 cigars daily  Vaping Use   Vaping Use: Never used  Substance and Sexual Activity   Alcohol use: Not Currently    Comment: 4-6 beers daily (decresed to 2 every other day -08/15/2020); quit January 2022   Drug use: Not Currently   Sexual activity: Yes    Birth control/protection: Surgical  Other Topics Concern   Not on file  Social History Narrative   Not on file   Social Determinants of Health   Financial Resource Strain: Not on file  Food  Insecurity: Not on file  Transportation Needs: Not on file  Physical Activity: Not on file  Stress: Not on file  Social Connections: Not on file    Review of Systems: Gen: Denies fever, chills, cold or flu like symptoms. CV: Denies chest pain or palpitations Resp: Denies dyspnea or cough GI: See HPI Derm: Denies rash Heme: See HPI  Physical Exam: BP 127/80    Pulse 77    Temp (!) 96.8 F (36 C)    Ht '5\' 5"'  (1.651 m)    Wt 130 lb 6.4 oz (59.1 kg)    BMI 21.70 kg/m  General:   Alert and oriented. No distress noted. Pleasant and cooperative.  Head:  Normocephalic and atraumatic. Eyes:  Conjuctiva clear without scleral icterus. Heart:  S1, S2 present without murmurs appreciated. Lungs:  Clear to auscultation bilaterally. No wheezes, rales, or rhonchi. No distress.  Abdomen:  +BS, soft, and non-distended. Mild TTP in epigastric area and RUQ. No rebound or guarding. No HSM or masses noted. External Rectal Exam: Circular, erythematous, and slightly scaly lesion just to the right of the anus about the size of a quarter.  Msk:  Symmetrical without gross deformities. Normal posture. Extremities:  Without edema. Neurologic:  Alert and  oriented x4 Psych: Normal mood and affect.

## 2021-03-02 ENCOUNTER — Encounter: Payer: Self-pay | Admitting: "Endocrinology

## 2021-03-02 ENCOUNTER — Telehealth: Payer: Self-pay

## 2021-03-02 ENCOUNTER — Other Ambulatory Visit (HOSPITAL_COMMUNITY)
Admission: RE | Admit: 2021-03-02 | Discharge: 2021-03-02 | Disposition: A | Discharge status: Home / Self Care | Payer: Medicare Other | Source: Ambulatory Visit | Attending: Gastroenterology | Admitting: Gastroenterology

## 2021-03-02 ENCOUNTER — Ambulatory Visit: Payer: Medicare Other | Admitting: Gastroenterology

## 2021-03-02 ENCOUNTER — Other Ambulatory Visit: Payer: Self-pay

## 2021-03-02 ENCOUNTER — Ambulatory Visit (INDEPENDENT_AMBULATORY_CARE_PROVIDER_SITE_OTHER): Payer: Medicare Other | Admitting: "Endocrinology

## 2021-03-02 ENCOUNTER — Encounter: Payer: Self-pay | Admitting: Gastroenterology

## 2021-03-02 VITALS — BP 112/70 | HR 92 | Ht 65.0 in | Wt 130.8 lb

## 2021-03-02 VITALS — BP 127/80 | HR 77 | Temp 96.8°F | Ht 65.0 in | Wt 130.4 lb

## 2021-03-02 DIAGNOSIS — R1310 Dysphagia, unspecified: Secondary | ICD-10-CM

## 2021-03-02 DIAGNOSIS — R1013 Epigastric pain: Secondary | ICD-10-CM | POA: Insufficient documentation

## 2021-03-02 DIAGNOSIS — B359 Dermatophytosis, unspecified: Secondary | ICD-10-CM

## 2021-03-02 DIAGNOSIS — R7989 Other specified abnormal findings of blood chemistry: Secondary | ICD-10-CM | POA: Diagnosis not present

## 2021-03-02 DIAGNOSIS — R10811 Right upper quadrant abdominal tenderness: Secondary | ICD-10-CM | POA: Diagnosis present

## 2021-03-02 DIAGNOSIS — R634 Abnormal weight loss: Secondary | ICD-10-CM | POA: Diagnosis not present

## 2021-03-02 DIAGNOSIS — E0520 Thyrotoxicosis with toxic multinodular goiter without thyrotoxic crisis or storm: Secondary | ICD-10-CM | POA: Diagnosis not present

## 2021-03-02 DIAGNOSIS — K219 Gastro-esophageal reflux disease without esophagitis: Secondary | ICD-10-CM | POA: Diagnosis not present

## 2021-03-02 DIAGNOSIS — K921 Melena: Secondary | ICD-10-CM

## 2021-03-02 DIAGNOSIS — K5900 Constipation, unspecified: Secondary | ICD-10-CM | POA: Insufficient documentation

## 2021-03-02 LAB — CBC WITH DIFFERENTIAL/PLATELET
Abs Immature Granulocytes: 0.02 10*3/uL (ref 0.00–0.07)
Basophils Absolute: 0.1 10*3/uL (ref 0.0–0.1)
Basophils Relative: 1 %
Eosinophils Absolute: 0.1 10*3/uL (ref 0.0–0.5)
Eosinophils Relative: 1 %
HCT: 37.4 % (ref 36.0–46.0)
Hemoglobin: 12.6 g/dL (ref 12.0–15.0)
Immature Granulocytes: 0 %
Lymphocytes Relative: 36 %
Lymphs Abs: 3.2 10*3/uL (ref 0.7–4.0)
MCH: 33.7 pg (ref 26.0–34.0)
MCHC: 33.7 g/dL (ref 30.0–36.0)
MCV: 100 fL (ref 80.0–100.0)
Monocytes Absolute: 0.7 10*3/uL (ref 0.1–1.0)
Monocytes Relative: 8 %
Neutro Abs: 4.8 10*3/uL (ref 1.7–7.7)
Neutrophils Relative %: 54 %
Platelets: 250 10*3/uL (ref 150–400)
RBC: 3.74 MIL/uL — ABNORMAL LOW (ref 3.87–5.11)
RDW: 13.4 % (ref 11.5–15.5)
WBC: 8.9 10*3/uL (ref 4.0–10.5)
nRBC: 0 % (ref 0.0–0.2)

## 2021-03-02 LAB — COMPREHENSIVE METABOLIC PANEL
ALT: 12 U/L (ref 0–44)
AST: 16 U/L (ref 15–41)
Albumin: 3.8 g/dL (ref 3.5–5.0)
Alkaline Phosphatase: 62 U/L (ref 38–126)
Anion gap: 10 (ref 5–15)
BUN: 5 mg/dL — ABNORMAL LOW (ref 6–20)
CO2: 22 mmol/L (ref 22–32)
Calcium: 9 mg/dL (ref 8.9–10.3)
Chloride: 101 mmol/L (ref 98–111)
Creatinine, Ser: 0.5 mg/dL (ref 0.44–1.00)
GFR, Estimated: >60 mL/min (ref >60)
Glucose, Bld: 106 mg/dL — ABNORMAL HIGH (ref 70–99)
Potassium: 3.5 mmol/L (ref 3.5–5.1)
Sodium: 133 mmol/L — ABNORMAL LOW (ref 135–145)
Total Bilirubin: 0.3 mg/dL (ref 0.3–1.2)
Total Protein: 7.5 g/dL (ref 6.5–8.1)

## 2021-03-02 MED ORDER — ACCU-CHEK GUIDE ME W/DEVICE KIT: 1.0000 | PACK | 0 refills | Status: AC

## 2021-03-02 MED ORDER — LANCETS MISC: 1.0000 | 3 refills | Status: DC

## 2021-03-02 MED ORDER — ACCU-CHEK GUIDE VI STRP: ORAL_STRIP | 2 refills | Status: DC

## 2021-03-02 NOTE — Assessment & Plan Note (Signed)
Addressed under abdominal pain, epigastric. 

## 2021-03-02 NOTE — Assessment & Plan Note (Addendum)
15 pound weight loss between June 2020 and September 2021, but now stable over the last 6 months. Weight loss was suspected to be secondary to newly diagnosed hyperthyroidism and decreased alcohol intake at that time.  Her colonoscopy was up-to-date as of March 2021.  No prior EGD and previously denied any significant upper GI symptoms.  She is now dealing with multiple upper GI symptoms as discussed above which we are evaluating with the labs and EGD.  It is encouraging that her weight has remained stable over the last 6 months, and we will continue to monitor.

## 2021-03-02 NOTE — Progress Notes (Signed)
03/02/2021, 12:49 PM                        Endocrinology follow-up note  Subjective:    Patient ID: Nicole Ewing, female    DOB: 1961-09-05, PCP Rory Percy, MD   Past Medical History:  Diagnosis Date  . Acid reflux   . Anxiety   . Arthritis   . Cervical cancer ([place])   . COPD (chronic obstructive pulmonary disease) ([place])   . Depression   . H/O: hysterectomy    Cervical dysplasia  . High cholesterol   . Hyperthyroidism   . Menopause ovarian failure   . Migraines    Past Surgical History:  Procedure Laterality Date  . ABDOMINAL HYSTERECTOMY    . BREAST SURGERY Right    breast buiopsy  . COLONOSCOPY  04/06/2007   [facility]:  Normal rectum, no inflammation, no colonic or rectal polyps, tortuous and redundant colon. Recommendations to repeat TCS in 5 years (2013).   . COLONOSCOPY WITH PROPOFOL N/A 02/07/2020   Procedure: COLONOSCOPY WITH PROPOFOL;  Surgeon: Daneil Dolin, MD;  1 hyperplastic polyp. Otherwise normal exam. Recommended repeat in 5 years.   Marland Kitchen POLYPECTOMY  02/07/2020   Procedure: POLYPECTOMY;  Surgeon: Daneil Dolin, MD;  [facility];  Service: Endoscopy;;  . TONSILLECTOMY    . TOTAL HIP ARTHROPLASTY Left 11/16/2018  . TOTAL HIP ARTHROPLASTY Right 07/02/2019   Social History   Socioeconomic History  . Marital status: Married    Spouse name: Not on file  . Number of children: Not on file  . Years of education: Not on file  . Highest education level: Not on file  Occupational History  . Not on file  Tobacco Use  . Smoking status: Current Every Day Smoker    Packs/day: 0.25    Years: 25.00    Pack years: 6.25    Types: Cigars, Cigarettes  . Smokeless tobacco: Former Systems developer  . Tobacco comment: 1-2 cigars daily  Vaping Use  . Vaping Use: Never used  Substance and Sexual Activity  . Alcohol use: Not Currently    Comment: 4-6 beers daily (decresed to 2 every other day  -08/15/2020)  . Drug use: Not Currently  . Sexual activity: Yes    Birth control/protection: Surgical  Other Topics Concern  . Not on file  Social History Narrative  . Not on file   Social Determinants of Health   Financial Resource Strain: Not on file  Food Insecurity: Not on file  Transportation Needs: Not on file  Physical Activity: Not on file  Stress: Not on file  Social Connections: Not on file   Family History  Problem Relation Age of Onset  . Melanoma Father   . Graves' disease Child   . Colon polyps Brother   . Colon cancer Neg Hx    Outpatient Encounter Medications as of 03/02/2021  Medication Sig  . Blood Glucose Monitoring Suppl (ACCU-CHEK GUIDE ME) w/Device KIT 1 Piece by Does not apply route as directed.  Marland Kitchen glucose blood (ACCU-CHEK GUIDE) test strip Only when you get symptoms of low glucose  . Lancets MISC 1 each by Does not apply  route as directed.  . Calcium Carbonate-Vitamin D (CALCIUM 600+D PO) Take by mouth daily.  . diazepam (VALIUM) 5 MG tablet Take 5 mg by mouth every 6 (six) hours as needed for anxiety.  Marland Kitchen HYDROcodone-acetaminophen (NORCO/VICODIN) 5-325 MG tablet Take 1 tablet by mouth every 8 (eight) hours as needed.  . Loratadine (CLARITIN ALLERGY CHILDRENS PO) Take 1 tablet by mouth daily.  . methocarbamol (ROBAXIN) 500 MG tablet Take 500 mg by mouth 4 (four) times daily.  . Multiple Vitamin (MULTIVITAMIN WITH MINERALS) TABS tablet Take 1 tablet by mouth daily.  Marland Kitchen omeprazole (PRILOSEC) 20 MG capsule Take 20 mg by mouth daily.  . Probiotic Product (PROBIOTIC DAILY PO) Take by mouth daily.  . vitamin B-12 (CYANOCOBALAMIN) 250 MCG tablet Take 250 mcg by mouth daily.   No facility-administered encounter medications on file as of 03/02/2021.   ALLERGIES: Allergies  Allergen Reactions  . Tramadol   . Codeine Rash  . Keflex [Cephalexin] Rash    VACCINATION STATUS:  There is no immunization history on file for this patient.  HPI Nicole Ewing  is 60 y.o. female who is returning with repeat thyroid function test after she was seen in consultation for hyperthyroidism.    PMD: Rory Percy, MD. She was diagnosed with hyperthyroidism in May 2020 when she was treated with methimazole with adequate treatment response.  She is currently not on any antithyroid intervention.  Her previsit thyroid function tests are consistent with euthyroid state.  She has no new complaints today.  She reports that she quit alcohol consumption since last visit.    -Her thyroid uptake and scan on October 23, 2019 showed 25.6% 24-hour uptake.  The thyroid  Scan was reported to be inhomogeneous with relative increased tracer accumulation at  right superior and left inferior lobes and relative decreased uptake at the right inferior pole suggesting presence of at least one thyroid nodule and ultrasound of the thyroid confirms same.    She underwent fine-needle biopsy of this nodule with benign findings.   Currently, she denies palpitations, heat intolerance, tremors.  She does have family history of thyroid dysfunction in her daughter.  She remains active smoker.  She denies dysphagia, shortness of breath, nor voice change. She admits to upto 6 beers daily.  Review of Systems Limited as above.  Objective:    BP 112/70   Pulse 92   Ht '5\' 5"'  (1.651 m)   Wt 130 lb 12.8 oz (59.3 kg)   BMI 21.77 kg/m   Wt Readings from Last 3 Encounters:  03/02/21 130 lb 12.8 oz (59.3 kg)  09/02/20 129 lb (58.5 kg)  08/15/20 129 lb 9.6 oz (58.8 kg)    Physical Exam  CMP ( most recent) CMP     Component Value Date/Time   NA 142 06/05/2019 1446   K 3.9 06/05/2019 1446   CL 107 06/05/2019 1446   CO2 24 06/05/2019 1446   GLUCOSE 110 06/05/2019 1446   BUN 5 (L) 06/05/2019 1446   BUN 7 04/26/2019 0000   CREATININE 0.57 12/04/2019 1353   CREATININE 0.43 (L) 06/05/2019 1446   CALCIUM 9.8 06/05/2019 1446   PROT 7.1 02/23/2021 1218   ALBUMIN 3.9 02/23/2021 1218   AST 21  02/23/2021 1218   ALT 12 02/23/2021 1218   ALKPHOS 64 02/23/2021 1218   BILITOT 0.4 02/23/2021 1218   GFRNONAA >60 12/04/2019 1353   GFRNONAA 112 06/05/2019 1446   GFRAA >60 12/04/2019 1353   GFRAA 130 06/05/2019 1446  Lipid Panel     Component Value Date/Time   CHOL 188 04/26/2019 0000   TRIG 92 04/26/2019 0000   HDL 68 04/26/2019 0000   LDLCALC 102 04/26/2019 0000      Lab Results  Component Value Date   TSH 0.593 02/23/2021   TSH 1.110 08/28/2020   TSH 0.543 02/25/2020   TSH 0.95 09/26/2019   TSH 0.01 (A) 04/26/2019   TSH 0.01 (A) 04/26/2019   FREET4 0.86 02/23/2021   FREET4 0.75 08/28/2020   FREET4 0.69 02/25/2020   FREET4 0.7 (L) 09/26/2019      Assessment & Plan:   1. Hyperthyroidism-resolved status post methimazole treatment.   Based on her previsit thyroid function tests which are consistent with euthyroid presentation, she will not need antithyroid intervention for now.     She will have repeat thyroid function test and office visit in 6 months.   Given her history of alcoholism, long-term methimazole  therapy is  unsafe for her. -If she relapses with hyperthyroidism, she would be considered for I-131 thyroid ablation preceded by repeat thyroid uptake and scan.   -She has a negative fine-needle aspiration of a thyroid nodule.    The patient was counseled on the dangers of tobacco use, and was advised to quit.  Reviewed strategies to maximize success, including removing cigarettes and smoking materials from environment.   -Her recent labs showed correction of her previously documented hyper transaminases.  I did not initiate any new prescriptions today.  - I advised her  to maintain close follow up with Rory Percy, MD for primary care needs.      - Time spent on this patient care encounter:  30 minutes of which 50% was spent in  counseling and the rest reviewing  her current and  previous labs / studies and medications  doses and developing a plan for  long term care, and documenting this care. Salomon Mast Pat  participated in the discussions, expressed understanding, and voiced agreement with the above plans.  All questions were answered to her satisfaction. she is encouraged to contact clinic should she have any questions or concerns prior to her return visit.    Follow up plan: Return in about 6 months (around 09/02/2021) for F/U with Pre-visit Labs.   Glade Lloyd, MD [facility] [address] [place], [place] [address] Phone: [phone]  Fax: [phone]     03/02/2021, 12:49 PM  This note was partially dictated with voice recognition software. Similar sounding words can be transcribed inadequately or may not  be corrected upon review.

## 2021-03-02 NOTE — Assessment & Plan Note (Addendum)
Chronic.  Well-controlled omeprazole 20 mg daily.  However, she is reporting 2-3 month history of dysphagia, intermittent epigastric pain/knot in the epigastric area not necessarily associated with meals, and intermittent black stools once every couple of weeks. This is discussed further below. Just recently started taking Aleve for right sided back pain radiating to RUQ which is also addressed below.  For now, we will continue omeprazole 20 mg daily and update CBC, CMP, and IFOBT. We will also arrange EGD +/- dilation with propofol with Dr. Gala Romney in the near future. If anemia or positive IFOBT, will increase omeprazole.

## 2021-03-02 NOTE — Assessment & Plan Note (Addendum)
Intermittent constipation in setting of chronic pain medications.  Typically with bowel movements daily, but will use MiraLAX as needed.  MiraLAX works okay, but patient is wondering if there is something she can take daily. No brbpr. Colonoscopy up to date in March 2021.   Recommended she use Colace 100 mg daily and MiraLAX 17 g in 8 ounces of water as needed. She will let me know if any ongonig trouble with constipation.

## 2021-03-02 NOTE — Telephone Encounter (Signed)
PA for EGD/DIL submitted via [facility] website. PA# [id number], valid 03/19/21-06/17/21.

## 2021-03-02 NOTE — Patient Instructions (Addendum)
Please have labs completed at [facility].   Please complete and return stool test to our office.  This is to check for blood in your stool.  We will arrange for you to have an upper endoscopy with possible stretching of your esophagus in the near future with Dr. Gala Romney.  We also arranging for you to have a ultrasound of your gallbladder.  Follow-up with your primary care provider on the possibility of hypoglycemia.  Follow-up with PCP on right-sided back pain.  Continue omeprazole 20 mg daily 30 minutes before breakfast.  For constipation: Start Colace 100 mg daily. Use MiraLAX 1 capful (17 g) daily in 8 ounces of water as needed.  For fungal infection around your anus, applied clotrimazole cream twice daily x7 days.  If there is still some residual rash, you may continue the medication for a few more days.  Congratulations on alcohol cessation! Keep up the good work!   I have provided you with general C. difficile precautions as you report your friend has C. difficile.  See information below.  We will plan to see you back after your procedure.  Do not hesitate to call if you have any questions or concerns prior.  Aliene Altes, PA-C [facility]   General C. Diff precautions:  Hand-Washing Hand-washing is the No. 1 preventative measure in stopping the spread and infection of C. Diff. It is important to wash hands with warm water and anti-bacterial soap for at least 30 seconds. Alcohol-based hand sanitizers are not effective against C. Diff and will not kill spores that may be on the hands.  Isolation Any person who is infected with C. Diff should remain in a separate bedroom and ideally have a separate bathroom for at least 48 hours after they have stopped having diarrhea during treatment. When you are around a person with this infection, it is important to wear gloves and, if possible, a yellow isolation gown to prevent picking up the spores from their environment.  Wash your hands immediately upon leaving their room to kill any spores that you may have accidentally picked up. Separate Hygiene Items The person infected with C. Diff should have their own hygiene items. Do not share washcloths, hand towels, toothbrushes, or linens with an infected person. Linens and clothes should be washed separately from anyone else's clothes with detergent and on the hot setting of the washer and dryer. Daily Cleaning of Affected Areas It is important to clean any areas that the infected person comes into contact with daily. Bathrooms should be cleaned after every visit. Mix 1 part of bleach with 10 parts of water as a cleaner and use on toilet bowls, sinks, sink and toilet handles and doorknobs. Vacuum carpets daily and mop any hard floors with the bleach solution used for bathroom cleansing to kill spores that are in the environment

## 2021-03-02 NOTE — Assessment & Plan Note (Addendum)
2-49-month history of epigastric abdominal pain with what feels like a knot in the area at times.  Initially reports pain may be triggered by certain movements, but later reports sensation of foods getting stuck in her lower esophagus which also causes pain in the upper abdomen/subxiphoid area. Denies nausea or vomiting and GERD remains well controlled on omeprazole 20 mg daily. Just recently started taking Aleve due to right sided back pain radiating to the RUQ, triggered by certain movements, coughing, or taking a deep breath and not affected by meals.  Also admits to intermittent dark/black stools once every couple of weeks for 6+ months. Notably, patient had lost about 15 lbs between June 2020 and September 2021, but weight has been stable over the last 6 months. Abdominal exam with mild TTP in the epigastric and RUQ region. No rebound or appreciable mass or hernia. Also with focal paraspinal tenderness along right side of thoracic vertebrae. Of note, she has chronic back pain.    Differentials for epigastric pain include esophagitis, gastritis, duodenitis, H. pylori, PUD, less likely malignancy.  Suspect right-sided pain is likely musculoskeletal. Doubt biliary etiology, but with mild TTP in RUQ, will go ahead and rule this out as it was somewhat difficult to get a clear history from patient today.  Unclear if she is having true melena as she is taking a multivitamin that contains low dose iron.   Plan: 1.  CBC, CMP, IFOBT, RUQ ultrasound. 2.  EGD +/- dilation with propofol with Dr. Gala Romney in the near future. The risks, benefits, and alternatives have been discussed with the patient in detail. The patient states understanding and desires to proceed.  ASA II 3.  Continue omeprazole 20 mg daily for now.  Consider increasing if evidence of anemia or I FOBT is positive. 4.  Also advised to follow-up with PCP on right sided back pain as this may be MSK.  5.  Follow-up after procedure.

## 2021-03-02 NOTE — Assessment & Plan Note (Addendum)
History of elevated LFTs noted in May 2020 in the setting of chronic alcohol use.  AST/ALT normalized in September 2020.  Alk phos remained slightly elevated in the 150s but later normalized in June 2021.  LFTs have remained within normal limits since June 2021.  Most recent HFP 02/23/2021 within normal limits.  She did have extensive evaluation revealing history of hep C with undetectable RNA indicating past/resolved infection.  Hep B serologies negative s/p hep B vaccine.  GGT normal.  ASMA negative.  ANA positive with elevated ANA titer, AMA elevated at 173. Ultrasound with fatty liver.  MRI/MRCP with no evidence of biliary dilation.  Suspect elevated LFTs were likely secondary to chronic alcohol use.  She has been working on decreasing alcohol and officially quit in January 2022. Although ANA and AMA were previously positive, clinical course doesn't seem consistent with PBC or autoimmune hepatitis as her LFTs have remained normal with decreasing alcohol use. However, I can't be sure there is no level of underlying disease considering she also has hyperthyroidism, and per patient, endocrinology is considering possibility of Graves disease. After further review of patient's chart after she left the office, I see immunoglobulins have not been checked. I will go ahead and arrange for this as it will help guide clinical decision making regarding possibility of autoimmune or PBC. I have notified patient of these recommendations.   Plan:  She was congratulated on alcohol cessation and advised to keep up the good work. Check immunoglobulins.  Recommend continuing to monitor LFTs every 6 months. Will likely discuss with Dr. Gala Romney pending immunoglobulin results.

## 2021-03-02 NOTE — Assessment & Plan Note (Addendum)
Patient reported rectal itching. Evidence of tinea just to the right of the anus on exam today.  Recommended clotrimazole cream twice daily for the next 7 days.  May continue for a few additional days if rash is improving but still with some residual symptoms after 7 days.

## 2021-03-02 NOTE — Assessment & Plan Note (Addendum)
2-3 months history of intermittent solid food dysphagia with sensation of items getting stuck in the lower esophagus/subxiphoid region.  Symptoms will eventually resolve on their own.  No regurgitation.  History of GERD that is well controlled on omeprazole 20 mg daily.  Notably, she also reports intermittent epigastric pain and also reports a "knot" in the epigastric area at times.  Denies association between epigastric pain and meals aside from dysphagia symptoms.  Notably, patient had 15 pound weight loss between June 2020 in September 2021 though weight has been stable over the last 6 months.  Differentials include esophagitis, esophageal web, ring, or stricture. Cannot rule out malignancy.  Plan:  Proceed with EGD +/- dilation with propofol with Dr. Gala Romney in the near future. The risks, benefits, and alternatives have been discussed with the patient in detail. The patient states understanding and desires to proceed.  ASA II

## 2021-03-03 ENCOUNTER — Other Ambulatory Visit: Payer: Self-pay

## 2021-03-03 ENCOUNTER — Ambulatory Visit (INDEPENDENT_AMBULATORY_CARE_PROVIDER_SITE_OTHER): Payer: Self-pay | Admitting: Gastroenterology

## 2021-03-03 ENCOUNTER — Encounter: Payer: Self-pay | Admitting: Gastroenterology

## 2021-03-03 ENCOUNTER — Other Ambulatory Visit (HOSPITAL_COMMUNITY)
Admission: RE | Admit: 2021-03-03 | Discharge: 2021-03-03 | Disposition: A | Discharge status: Home / Self Care | Payer: Medicare Other | Source: Ambulatory Visit | Attending: Gastroenterology | Admitting: Gastroenterology

## 2021-03-03 ENCOUNTER — Other Ambulatory Visit: Payer: Self-pay | Admitting: Gastroenterology

## 2021-03-03 DIAGNOSIS — K219 Gastro-esophageal reflux disease without esophagitis: Secondary | ICD-10-CM

## 2021-03-03 DIAGNOSIS — R7989 Other specified abnormal findings of blood chemistry: Secondary | ICD-10-CM | POA: Insufficient documentation

## 2021-03-03 DIAGNOSIS — R768 Other specified abnormal immunological findings in serum: Secondary | ICD-10-CM | POA: Insufficient documentation

## 2021-03-03 DIAGNOSIS — R197 Diarrhea, unspecified: Secondary | ICD-10-CM

## 2021-03-03 LAB — IFOBT (OCCULT BLOOD): IFOBT: NEGATIVE

## 2021-03-03 NOTE — Progress Notes (Signed)
Discussed with patient the need for checking immunoglobulins in light of prior elevated ANA, AMA, and now with history of hyperthyroidism with question of Graves to help guide clinical decision making for history of elevated LFTs as I can't rule out a smoldering underlying autoimmune condition or PBC. Patient plans to come by the office this afternoon to pick up lab slip and will have labs completed at [facility].

## 2021-03-03 NOTE — H&P (View-Only) (Signed)
Discussed with patient the need for checking immunoglobulins in light of prior elevated ANA, AMA, and now with history of hyperthyroidism with question of Graves to help guide clinical decision making for history of elevated LFTs as I can't rule out a smoldering underlying autoimmune condition or PBC. Patient plans to come by the office this afternoon to pick up lab slip and will have labs completed at [facility] [place].

## 2021-03-04 LAB — IGG, IGA, IGM
IgA: 190 mg/dL (ref 87–352)
IgG (Immunoglobin G), Serum: 750 mg/dL (ref 586–1602)
IgM (Immunoglobulin M), Srm: 181 mg/dL (ref 26–217)

## 2021-03-09 ENCOUNTER — Other Ambulatory Visit: Payer: Self-pay

## 2021-03-09 ENCOUNTER — Ambulatory Visit: Payer: Medicare Other | Admitting: Dermatology

## 2021-03-09 ENCOUNTER — Encounter: Payer: Self-pay | Admitting: Dermatology

## 2021-03-09 DIAGNOSIS — Z1283 Encounter for screening for malignant neoplasm of skin: Secondary | ICD-10-CM

## 2021-03-09 DIAGNOSIS — L821 Other seborrheic keratosis: Secondary | ICD-10-CM | POA: Diagnosis not present

## 2021-03-09 DIAGNOSIS — D0461 Carcinoma in situ of skin of right upper limb, including shoulder: Secondary | ICD-10-CM | POA: Diagnosis not present

## 2021-03-09 DIAGNOSIS — C44519 Basal cell carcinoma of skin of other part of trunk: Secondary | ICD-10-CM

## 2021-03-09 DIAGNOSIS — C44612 Basal cell carcinoma of skin of right upper limb, including shoulder: Secondary | ICD-10-CM | POA: Diagnosis not present

## 2021-03-09 DIAGNOSIS — D0439 Carcinoma in situ of skin of other parts of face: Secondary | ICD-10-CM | POA: Diagnosis not present

## 2021-03-09 DIAGNOSIS — D485 Neoplasm of uncertain behavior of skin: Secondary | ICD-10-CM

## 2021-03-09 DIAGNOSIS — L570 Actinic keratosis: Secondary | ICD-10-CM | POA: Diagnosis not present

## 2021-03-09 NOTE — Patient Instructions (Signed)

## 2021-03-10 ENCOUNTER — Ambulatory Visit (HOSPITAL_COMMUNITY)
Admission: RE | Admit: 2021-03-10 | Discharge: 2021-03-10 | Disposition: A | Discharge status: Home / Self Care | Payer: Medicare Other | Source: Ambulatory Visit | Attending: Gastroenterology | Admitting: Gastroenterology

## 2021-03-10 DIAGNOSIS — R10811 Right upper quadrant abdominal tenderness: Secondary | ICD-10-CM | POA: Insufficient documentation

## 2021-03-17 ENCOUNTER — Encounter: Payer: Self-pay | Admitting: Dermatology

## 2021-03-17 ENCOUNTER — Other Ambulatory Visit (HOSPITAL_COMMUNITY)
Admission: RE | Admit: 2021-03-17 | Discharge: 2021-03-17 | Disposition: A | Discharge status: Home / Self Care | Payer: Medicare Other | Source: Ambulatory Visit | Attending: Internal Medicine | Admitting: Internal Medicine

## 2021-03-17 ENCOUNTER — Other Ambulatory Visit: Payer: Self-pay

## 2021-03-17 DIAGNOSIS — Z01812 Encounter for preprocedural laboratory examination: Secondary | ICD-10-CM | POA: Diagnosis present

## 2021-03-17 DIAGNOSIS — Z20822 Contact with and (suspected) exposure to covid-19: Secondary | ICD-10-CM | POA: Insufficient documentation

## 2021-03-17 LAB — SARS CORONAVIRUS 2 (TAT 6-24 HRS): SARS Coronavirus 2: NEGATIVE

## 2021-03-17 NOTE — Telephone Encounter (Signed)
Phone call to patient with her pathology results. Patient aware of results.  

## 2021-03-17 NOTE — Telephone Encounter (Signed)
-----   Message from [name], MD sent at 03/17/2021  5:10 AM EDT ----- Schedule surgery with Dr. Darene Lamer

## 2021-03-19 ENCOUNTER — Encounter (HOSPITAL_COMMUNITY)
Admission: RE | Disposition: A | Discharge status: Home / Self Care | Payer: Self-pay | Source: Home / Self Care | Attending: Internal Medicine

## 2021-03-19 ENCOUNTER — Ambulatory Visit (HOSPITAL_COMMUNITY): Payer: Medicare Other | Admitting: Anesthesiology

## 2021-03-19 ENCOUNTER — Encounter (HOSPITAL_COMMUNITY): Payer: Self-pay | Admitting: Internal Medicine

## 2021-03-19 ENCOUNTER — Other Ambulatory Visit: Payer: Self-pay

## 2021-03-19 ENCOUNTER — Ambulatory Visit (HOSPITAL_COMMUNITY)
Admission: RE | Admit: 2021-03-19 | Discharge: 2021-03-19 | Disposition: A | Discharge status: Home / Self Care | Payer: Medicare Other | Attending: Internal Medicine | Admitting: Internal Medicine

## 2021-03-19 DIAGNOSIS — K219 Gastro-esophageal reflux disease without esophagitis: Secondary | ICD-10-CM | POA: Insufficient documentation

## 2021-03-19 DIAGNOSIS — E0590 Thyrotoxicosis, unspecified without thyrotoxic crisis or storm: Secondary | ICD-10-CM | POA: Insufficient documentation

## 2021-03-19 DIAGNOSIS — R1310 Dysphagia, unspecified: Secondary | ICD-10-CM

## 2021-03-19 HISTORY — PX: BIOPSY: SHX5522

## 2021-03-19 HISTORY — PX: ESOPHAGOGASTRODUODENOSCOPY (EGD) WITH PROPOFOL: SHX5813

## 2021-03-19 HISTORY — PX: MALONEY DILATION: SHX5535

## 2021-03-19 LAB — GLUCOSE, CAPILLARY: Glucose-Capillary: 92 mg/dL (ref 70–99)

## 2021-03-19 SURGERY — ESOPHAGOGASTRODUODENOSCOPY (EGD) WITH PROPOFOL
Anesthesia: General

## 2021-03-19 MED ORDER — LACTATED RINGERS IV SOLN: INTRAVENOUS | Status: DC

## 2021-03-19 MED ORDER — STERILE WATER FOR IRRIGATION IR SOLN
Status: DC | PRN
  Administered 2021-03-19: 100 mL

## 2021-03-19 MED ORDER — LIDOCAINE HCL (CARDIAC) PF 50 MG/5ML IV SOSY
PREFILLED_SYRINGE | INTRAVENOUS | Status: DC | PRN
  Administered 2021-03-19: 50 mg via INTRAVENOUS

## 2021-03-19 MED ORDER — PROPOFOL 10 MG/ML IV BOLUS
INTRAVENOUS | Status: DC | PRN
  Administered 2021-03-19 (×2): 40 mg via INTRAVENOUS
  Administered 2021-03-19 (×2): 100 mg via INTRAVENOUS

## 2021-03-19 NOTE — Interval H&P Note (Signed)
History and Physical Interval Note:  03/19/2021 11:58 AM  Nicole Ewing  has presented today for surgery, with the diagnosis of dysphagia, GERD, epigastric pain.  The various methods of treatment have been discussed with the patient and family. After consideration of risks, benefits and other options for treatment, the patient has consented to  Procedure(s) with comments: ESOPHAGOGASTRODUODENOSCOPY (EGD) WITH PROPOFOL (N/A) - PM MALONEY DILATION (N/A) as a surgical intervention.  The patient's history has been reviewed, patient examined, no change in status, stable for surgery.  I have reviewed the patient's chart and labs.  Questions were answered to the patient's satisfaction.     Manus Rudd    Patient seen and examined in short stay.  No change.  EGD with possible esophageal dilation as feasible/appropriate today per plan.  The risks, benefits, limitations, alternatives and imponderables have been reviewed with the patient. Potential for esophageal dilation, biopsy, etc. have also been reviewed.  Questions have been answered. All parties agreeable.

## 2021-03-19 NOTE — Anesthesia Procedure Notes (Signed)
Date/Time: 03/19/2021 12:05 PM Performed by: Vista Deck, CRNA Pre-anesthesia Checklist: Patient identified, Emergency Drugs available, Suction available, Timeout performed and Patient being monitored Patient Re-evaluated:Patient Re-evaluated prior to induction Oxygen Delivery Method: Nasal Cannula

## 2021-03-19 NOTE — Discharge Instructions (Signed)
EGD Discharge instructions Please read the instructions outlined below and refer to this sheet in the next few weeks. These discharge instructions provide you with general information on caring for yourself after you leave the hospital. Your doctor may also give you specific instructions. While your treatment has been planned according to the most current medical practices available, unavoidable complications occasionally occur. If you have any problems or questions after discharge, please call your doctor. ACTIVITY  You may resume your regular activity but move at a slower pace for the next 24 hours.   Take frequent rest periods for the next 24 hours.   Walking will help expel (get rid of) the air and reduce the bloated feeling in your abdomen.   No driving for 24 hours (because of the anesthesia (medicine) used during the test).   You may shower.   Do not sign any important legal documents or operate any machinery for 24 hours (because of the anesthesia used during the test).  NUTRITION  Drink plenty of fluids.   You may resume your normal diet.   Begin with a light meal and progress to your normal diet.   Avoid alcoholic beverages for 24 hours or as instructed by your caregiver.  MEDICATIONS  You may resume your normal medications unless your caregiver tells you otherwise.  WHAT YOU CAN EXPECT TODAY  You may experience abdominal discomfort such as a feeling of fullness or "gas" pains.  FOLLOW-UP  Your doctor will discuss the results of your test with you.  SEEK IMMEDIATE MEDICAL ATTENTION IF ANY OF THE FOLLOWING OCCUR:  Excessive nausea (feeling sick to your stomach) and/or vomiting.   Severe abdominal pain and distention (swelling).   Trouble swallowing.   Temperature over 101 F (37.8 C).   Rectal bleeding or vomiting of blood.    Stomach appeared inflamed today.  Biopsies taken.  Your esophagus was stretched today.  Continue omeprazole 20 mg daily.  Further  recommendations to follow pending review of pathology report  Office visit with Aliene Altes in 1 month  At patient request, I called Sable Knoles at [phone] -left message with results on answering service.   [email or web address]">  Esophageal Dilatation Esophageal dilatation, also called esophageal dilation, is a procedure to widen or open a blocked or narrowed part of the esophagus. The esophagus is the part of the body that moves food and liquid from the mouth to the stomach. You may need this procedure if:  You have a buildup of scar tissue in your esophagus that makes it difficult, painful, or impossible to swallow. This can be caused by gastroesophageal reflux disease (GERD).  You have cancer of the esophagus.  There is a problem with how food moves through your esophagus. In some cases, you may need this procedure repeated at a later time to dilate the esophagus gradually. Tell a health care provider about:  Any allergies you have.  All medicines you are taking, including vitamins, herbs, eye drops, creams, and over-the-counter medicines.  Any problems you or family members have had with anesthetic medicines.  Any blood disorders you have.  Any surgeries you have had.  Any medical conditions you have.  Any antibiotic medicines you are required to take before dental procedures.  Whether you are pregnant or may be pregnant. What are the risks? Generally, this is a safe procedure. However, problems may occur, including:  Bleeding due to a tear in the lining of the esophagus.  A hole, or perforation, in the esophagus. What  happens before the procedure?  Ask your health care provider about: ? Changing or stopping your regular medicines. This is especially important if you are taking diabetes medicines or blood thinners. ? Taking medicines such as aspirin and ibuprofen. These medicines can  thin your blood. Do not take these medicines unless your health care provider tells you to take them. ? Taking over-the-counter medicines, vitamins, herbs, and supplements.  Follow instructions from your health care provider about eating or drinking restrictions.  Plan to have a responsible adult take you home from the hospital or clinic.  Plan to have a responsible adult care for you for the time you are told after you leave the hospital or clinic. This is important. What happens during the procedure?  You may be given a medicine to help you relax (sedative).  A numbing medicine may be sprayed into the back of your throat, or you may gargle the medicine.  Your health care provider may perform the dilatation using various surgical instruments, such as: ? Simple dilators. This instrument is carefully placed in the esophagus to stretch it. ? Guided wire bougies. This involves using an endoscope to insert a wire into the esophagus. A dilator is passed over this wire to enlarge the esophagus. Then the wire is removed. ? Balloon dilators. An endoscope with a small balloon is inserted into the esophagus. The balloon is inflated to stretch the esophagus and open it up. The procedure may vary among health care providers and hospitals. What can I expect after the procedure?  Your blood pressure, heart rate, breathing rate, and blood oxygen level will be monitored until you leave the hospital or clinic.  Your throat may feel slightly sore and numb. This will get better over time.  You will not be allowed to eat or drink until your throat is no longer numb.  When you are able to drink, urinate, and sit on the edge of the bed without nausea or dizziness, you may be able to return home. Follow these instructions at home:  Take over-the-counter and prescription medicines only as told by your health care provider.  If you were given a sedative during the procedure, it can affect you for several  hours. Do not drive or operate machinery until your health care provider says that it is safe.  Plan to have a responsible adult care for you for the time you are told. This is important.  Follow instructions from your health care provider about any eating or drinking restrictions.  Do not use any products that contain nicotine or tobacco, such as cigarettes, e-cigarettes, and chewing tobacco. If you need help quitting, ask your health care provider.  Keep all follow-up visits. This is important. Contact a health care provider if:  You have a fever.  You have pain that is not relieved by medicine. Get help right away if:  You have chest pain.  You have trouble breathing.  You have trouble swallowing.  You vomit blood.  You have black, tarry, or bloody stools. These symptoms may represent a serious problem that is an emergency. Do not wait to see if the symptoms will go away. Get medical help right away. Call your local emergency services (911 in the [place] Do not drive yourself to the hospital. Summary  Esophageal dilatation, also called esophageal dilation, is a procedure to widen or open a blocked or narrowed part of the esophagus.  Plan to have a responsible adult take you home from the hospital or clinic.  For this procedure, a numbing medicine may be sprayed into the back of your throat, or you may gargle the medicine.  Do not drive or operate machinery until your health care provider says that it is safe. This information is not intended to replace advice given to you by your health care provider. Make sure you discuss any questions you have with your health care provider. Document Revised: 04/09/2020 Document Reviewed: 04/09/2020 Elsevier Patient Education  [place].

## 2021-03-19 NOTE — Anesthesia Preprocedure Evaluation (Signed)
Anesthesia Evaluation  Patient identified by MRN, date of birth, ID band Patient awake    Reviewed: Allergy & Precautions, NPO status , Patient's Chart, lab work & pertinent test results  Airway Mallampati: III  TM Distance: >3 FB Neck ROM: Full    Dental  (+) Partial Upper, Dental Advisory Given   Pulmonary COPD, Current SmokerPatient did not abstain from smoking.,    Pulmonary exam normal breath sounds clear to auscultation       Cardiovascular Exercise Tolerance: Good Normal cardiovascular exam Rhythm:Regular Rate:Normal     Neuro/Psych  Headaches, PSYCHIATRIC DISORDERS Anxiety Depression    GI/Hepatic Neg liver ROS, GERD  Medicated and Controlled,  Endo/Other  Hyperthyroidism (not on medication, stopped medication as per her doctor)   Renal/GU negative Renal ROS   Cervical cancer    Musculoskeletal  (+) Arthritis ,   Abdominal   Peds  Hematology   Anesthesia Other Findings   Reproductive/Obstetrics                             Anesthesia Physical  Anesthesia Plan  ASA: II  Anesthesia Plan: General   Post-op Pain Management:    Induction: Intravenous  PONV Risk Score and Plan: TIVA  Airway Management Planned: Nasal Cannula, Natural Airway and Simple Face Mask  Additional Equipment:   Intra-op Plan:   Post-operative Plan:   Informed Consent: I have reviewed the patients History and Physical, chart, labs and discussed the procedure including the risks, benefits and alternatives for the proposed anesthesia with the patient or authorized representative who has indicated his/her understanding and acceptance.     Dental advisory given  Plan Discussed with: CRNA and Surgeon  Anesthesia Plan Comments:         Anesthesia Quick Evaluation

## 2021-03-19 NOTE — Anesthesia Postprocedure Evaluation (Signed)
Anesthesia Post Note  Patient: Nicole Ewing  Procedure(s) Performed: ESOPHAGOGASTRODUODENOSCOPY (EGD) WITH PROPOFOL (N/A ) MALONEY DILATION (N/A ) BIOPSY  Patient location during evaluation: Endoscopy Anesthesia Type: General Level of consciousness: awake and alert and oriented Pain management: pain level controlled Vital Signs Assessment: post-procedure vital signs reviewed and stable Respiratory status: spontaneous breathing and respiratory function stable Cardiovascular status: blood pressure returned to baseline and stable Postop Assessment: no apparent nausea or vomiting Anesthetic complications: no   No complications documented.   Last Vitals:  Vitals:   03/19/21 1125 03/19/21 1226  BP: 121/90 111/67  Pulse: 83 92  Resp: 16 14  Temp: 36.6 C 36.8 C  SpO2: 99% 97%    Last Pain:  Vitals:   03/19/21 1226  TempSrc: Oral  PainSc: 0-No pain                 Aashka Salomone C Madisin Hasan

## 2021-03-19 NOTE — Op Note (Signed)
[facility] Patient Name: Nicole Ewing Procedure Date: 03/19/2021 11:45 AM MRN: [id number] Date of Birth: October 27, 1961 Attending MD: Norvel Richards , MD CSN: [id number] Age: 60 Admit Type: Outpatient Procedure:                Upper GI endoscopy Indications:              Epigastric abdominal pain, Dysphagia Providers:                Norvel Richards, MD, Lambert Mody, Aram Candela Referring MD:              Medicines:                Propofol per Anesthesia Complications:            No immediate complications. Estimated Blood Loss:     Estimated blood loss was minimal. Procedure:                Pre-Anesthesia Assessment:                           - Prior to the procedure, a History and Physical                            was performed, and patient medications and                            allergies were reviewed. The patient's tolerance of                            previous anesthesia was also reviewed. The risks                            and benefits of the procedure and the sedation                            options and risks were discussed with the patient.                            All questions were answered, and informed consent                            was obtained. Prior Anticoagulants: The patient has                            taken no previous anticoagulant or antiplatelet                            agents. ASA Grade Assessment: II - A patient with                            mild systemic disease. After reviewing the risks  and benefits, the patient was deemed in                            satisfactory condition to undergo the procedure.                           After obtaining informed consent, the endoscope was                            passed under direct vision. Throughout the                            procedure, the patient's blood pressure, pulse, and                            oxygen  saturations were monitored continuously. The                            [id number]-H190 ([id number]) scope was introduced through the                            mouth, and advanced to the second part of duodenum.                            The upper GI endoscopy was accomplished without                            difficulty. The patient tolerated the procedure                            well. Scope In: 12:13:06 PM Scope Out: 12:21:42 PM Total Procedure Duration: 0 hours 8 minutes 36 seconds  Findings:      Tubular esophagus appeared patent throughout its course normal-appearing       mucosa.      Patchy erythema of the gastric mucosa particularly the body with a       polypoid appearance. No ulcer or infiltrating process patent pylorus.       Normal first and second portion of the duodenum      Esophagus scope removed. A 54 French Maloney dilator was passed to full       insertion easily. A look back revealed no apparent complication related       to this maneuver. Finally, biopsies of the mid and distal esophagus       taken for histologic study.      Biopsies the abnormal gastric mucosa taken for histologic study. Impression:               - Normal esophagus.?"Post dilation and biopsy.                           Abnormal appearing stomach of uncertain                            significance -status post biopsy. Moderate Sedation:      Moderate (conscious) sedation was personally administered by an  anesthesia professional. The following parameters were monitored: oxygen       saturation, heart rate, blood pressure, respiratory rate, EKG, adequacy       of pulmonary ventilation, and response to care. Recommendation:           - Patient has a contact number available for                            emergencies. The signs and symptoms of potential                            delayed complications were discussed with the                            patient. Return to normal activities tomorrow.                             Written discharge instructions were provided to the                            patient.                           - Advance diet as tolerated. Continue omeprazole 20                            mg daily. Office visit with [place] in 1 month.                            Follow-up on pathology Procedure Code(s):        --- Professional ---                           [phone], Esophagogastroduodenoscopy, flexible,                            transoral; diagnostic, including collection of                            specimen(s) by brushing or washing, when performed                            (separate procedure) Diagnosis Code(s):        --- Professional ---                           R10.13, Epigastric pain                           R13.10, Dysphagia, unspecified CPT copyright 2019 American Medical Association. All rights reserved. The codes documented in this report are preliminary and upon coder review may  be revised to meet current compliance requirements. Cristopher Estimable. Fernando Stoiber, MD Norvel Richards, MD 03/19/2021 12:59:03 PM This report has been signed electronically. Number of Addenda: 0

## 2021-03-19 NOTE — Transfer of Care (Signed)
Immediate Anesthesia Transfer of Care Note  Patient: Nicole Ewing  Procedure(s) Performed: ESOPHAGOGASTRODUODENOSCOPY (EGD) WITH PROPOFOL (N/A ) MALONEY DILATION (N/A ) BIOPSY  Patient Location: Endoscopy Unit  Anesthesia Type:General  Level of Consciousness: awake and patient cooperative  Airway & Oxygen Therapy: Patient Spontanous Breathing  Post-op Assessment: Report given to RN and Post -op Vital signs reviewed and stable  Post vital signs: Reviewed and stable  Last Vitals:  Vitals Value Taken Time  BP 111/67 03/19/21 1226  Temp 36.8 C 03/19/21 1226  Pulse 92 03/19/21 1226  Resp 14 03/19/21 1226  SpO2 97 % 03/19/21 1226    Last Pain:  Vitals:   03/19/21 1226  TempSrc: Oral  PainSc: 0-No pain         Complications: No complications documented.

## 2021-03-20 LAB — SURGICAL PATHOLOGY

## 2021-03-22 ENCOUNTER — Encounter: Payer: Self-pay | Admitting: Dermatology

## 2021-03-22 NOTE — Progress Notes (Signed)
Follow-Up Visit   Subjective  Nicole Ewing is a 60 y.o. female who presents for the following: Annual Exam (Full body skin check. Lesion on right hand x years, per patient feels like there is no grip sometimes, no bleeding, tender to touch. Spot on nose x years, right side lesion on cheek x years previously frozen, patient doesn't remember who froze it. Lesion on right shoulder x years per patient just looks weird. ).  General skin examination, and multiple new spots on right cheek and torso and hand Location:  Duration:  Quality:  Associated Signs/Symptoms: Modifying Factors:  Severity:  Timing: Context:   Objective  Well appearing patient in no apparent distress; mood and affect are within normal limits. Objective  Scalp: Full body skin check. No atypical moles.  In addition to the for probable superficial carcinomas that were biopsied, Anmarie has at least a half dozen other possible carcinomas in the on the torso.  This will be addressed at follow-up visits.  Objective  Left Dorsal Hand, Mid Back: Hornlike Moderately thick pink crusts  Objective  Right Lower Leg - Anterior: multiple 2 to 6 mm brown textured flattopped papules  Objective  Right Zygomatic Area: Ill-defined 1.9 cm centrally eroded crust, suspect superficial SCCA     Objective  Right Shoulder - Anterior: Waxy 1.3 cm pink nodule, superficial carcinoma     Objective  Right Dorsal Hand: Ill-defined 2.8 cm pink crust with hornlike areas, superficial SCCA     Objective  Mid Back: Waxy 1.7 cm pink lesion       A full examination was performed including scalp, head, eyes, ears, nose, lips, neck, chest, axillae, abdomen, back, buttocks, bilateral upper extremities, bilateral lower extremities, hands, feet, fingers, toes, fingernails, and toenails. All findings within normal limits unless otherwise noted below.  Areas beneath undergarments not fully examined.   Assessment & Plan     Screening exam for skin cancer Scalp  Yearly skin check, twice annual self examination, and focus on improved ultraviolet protection.  AK (actinic keratosis) (2) Mid Back; Left Dorsal Hand  Destruction of lesion - Left Dorsal Hand, Mid Back Complexity: simple   Destruction method: cryotherapy   Informed consent: discussed and consent obtained   Timeout:  patient name, date of birth, surgical site, and procedure verified Lesion destroyed using liquid nitrogen: Yes   Cryotherapy cycles:  5 Outcome: patient tolerated procedure well with no complications   Post-procedure details: wound care instructions given    Seborrheic keratosis Right Lower Leg - Anterior  Leave if stable  Neoplasm of uncertain behavior of skin (4) Right Zygomatic Area  Skin / nail biopsy Type of biopsy: tangential   Informed consent: discussed and consent obtained   Timeout: patient name, date of birth, surgical site, and procedure verified   Procedure prep:  Patient was prepped and draped in usual sterile fashion (Non sterile) Prep type:  Chlorhexidine Anesthesia: the lesion was anesthetized in a standard fashion   Anesthetic:  1% lidocaine w/ epinephrine 1-100,000 local infiltration Instrument used: flexible razor blade   Outcome: patient tolerated procedure well   Post-procedure details: wound care instructions given    Specimen 1 - Surgical pathology Differential Diagnosis: bcc vs scc  Check Margins: No  Right Shoulder - Anterior  Skin / nail biopsy Type of biopsy: tangential   Informed consent: discussed and consent obtained   Timeout: patient name, date of birth, surgical site, and procedure verified   Procedure prep:  Patient was prepped and  draped in usual sterile fashion (Non sterile) Prep type:  Chlorhexidine Anesthesia: the lesion was anesthetized in a standard fashion   Anesthetic:  1% lidocaine w/ epinephrine 1-100,000 local infiltration Instrument used: flexible razor blade    Outcome: patient tolerated procedure well   Post-procedure details: wound care instructions given    Specimen 2 - Surgical pathology Differential Diagnosis: bcc vs scc  Check Margins: No  Right Dorsal Hand  Skin / nail biopsy Type of biopsy: tangential   Informed consent: discussed and consent obtained   Timeout: patient name, date of birth, surgical site, and procedure verified   Procedure prep:  Patient was prepped and draped in usual sterile fashion (Non sterile) Prep type:  Chlorhexidine Anesthesia: the lesion was anesthetized in a standard fashion   Anesthetic:  1% lidocaine w/ epinephrine 1-100,000 local infiltration Instrument used: flexible razor blade   Outcome: patient tolerated procedure well   Post-procedure details: wound care instructions given    Specimen 3 - Surgical pathology Differential Diagnosis: bcc vs scc Check Margins: No  Mid Back  Skin / nail biopsy Type of biopsy: tangential   Informed consent: discussed and consent obtained   Timeout: patient name, date of birth, surgical site, and procedure verified   Procedure prep:  Patient was prepped and draped in usual sterile fashion (Non sterile) Prep type:  Chlorhexidine Anesthesia: the lesion was anesthetized in a standard fashion   Anesthetic:  1% lidocaine w/ epinephrine 1-100,000 local infiltration Instrument used: flexible razor blade   Outcome: patient tolerated procedure well   Post-procedure details: wound care instructions given    Specimen 4 - Surgical pathology Differential Diagnosis: bcc vs scc  Check Margins: No      I, [name], MD, have reviewed all documentation for this visit.  The documentation on 03/22/21 for the exam, diagnosis, procedures, and orders are all accurate and complete.

## 2021-03-27 ENCOUNTER — Encounter (HOSPITAL_COMMUNITY): Payer: Self-pay | Admitting: Internal Medicine

## 2021-03-28 ENCOUNTER — Encounter: Payer: Self-pay | Admitting: Internal Medicine

## 2021-04-07 ENCOUNTER — Encounter: Payer: Self-pay | Admitting: Internal Medicine

## 2021-04-15 NOTE — H&P (Signed)
Done.  See update on procedure day

## 2021-04-15 NOTE — H&P (Signed)
No change.  See our March 28 history and physical.  The risks, benefits, limitations, alternatives and imponderables have been reviewed with the patient. Potential for esophageal dilation, biopsy, etc. have also been reviewed.  Questions have been answered. All parties agreeable.

## 2021-04-23 ENCOUNTER — Other Ambulatory Visit: Payer: Self-pay

## 2021-04-23 ENCOUNTER — Encounter: Payer: Self-pay | Admitting: Dermatology

## 2021-04-23 ENCOUNTER — Ambulatory Visit (INDEPENDENT_AMBULATORY_CARE_PROVIDER_SITE_OTHER): Payer: Medicare Other | Admitting: Dermatology

## 2021-04-23 DIAGNOSIS — C44519 Basal cell carcinoma of skin of other part of trunk: Secondary | ICD-10-CM | POA: Diagnosis not present

## 2021-04-23 DIAGNOSIS — D099 Carcinoma in situ, unspecified: Secondary | ICD-10-CM

## 2021-04-23 DIAGNOSIS — L570 Actinic keratosis: Secondary | ICD-10-CM

## 2021-04-23 DIAGNOSIS — D0461 Carcinoma in situ of skin of right upper limb, including shoulder: Secondary | ICD-10-CM

## 2021-04-23 NOTE — Patient Instructions (Signed)

## 2021-05-08 ENCOUNTER — Encounter: Payer: Self-pay | Admitting: Dermatology

## 2021-05-08 NOTE — Progress Notes (Signed)
   Follow-Up Visit   Subjective  Nicole Ewing is a 60 y.o. female who presents for the following: Procedure (Right hand lower back ).  Multiple biopsy-proven nonmelanoma skin cancers plus several other spots to check Location:  Duration:  Quality:  Associated Signs/Symptoms: Modifying Factors:  Severity:  Timing: Context:   Objective  Well appearing patient in no apparent distress; mood and affect are within normal limits. Objective  Right Dorsal Hand: Lesion identified by Dr.Leilanni Halvorson and nurse in room.    Objective  Mid Back: Lesion identified by Dr.Rowyn Mustapha and nurse in room.    Objective  Right Breast, Right Forearm - Posterior: Pink 3 mm hornlike crusts    A focused examination was performed including Head, neck, arms, back. Relevant physical exam findings are noted in the Assessment and Plan.   Assessment & Plan    Squamous cell carcinoma in situ Right Dorsal Hand  Destruction of lesion Complexity: simple   Destruction method: electrodesiccation and curettage   Informed consent: discussed and consent obtained   Timeout:  patient name, date of birth, surgical site, and procedure verified Anesthesia: the lesion was anesthetized in a standard fashion   Anesthetic:  1% lidocaine w/ epinephrine 1-100,000 local infiltration Curettage performed in three different directions: Yes   Electrodesiccation performed over the curetted area: Yes   Curettage cycles:  3 Lesion length (cm):  3 Lesion width (cm):  3 Margin per side (cm):  0 Final wound size (cm):  3 Hemostasis achieved with:  ferric subsulfate and electrodesiccation Outcome: patient tolerated procedure well with no complications   Additional details:  Wound innoculated with 5 fluorouracil solution.  Basal cell carcinoma (BCC) of skin of other part of torso Mid Back  Destruction of lesion Complexity: simple   Destruction method: electrodesiccation and curettage   Informed consent: discussed and  consent obtained   Timeout:  patient name, date of birth, surgical site, and procedure verified Anesthesia: the lesion was anesthetized in a standard fashion   Anesthetic:  1% lidocaine w/ epinephrine 1-100,000 local infiltration Curettage performed in three different directions: Yes   Curettage cycles:  3 Lesion length (cm):  1.9 Lesion width (cm):  1.9 Margin per side (cm):  0 Final wound size (cm):  1.9 Hemostasis achieved with:  ferric subsulfate Outcome: patient tolerated procedure well with no complications   Additional details:  Wound innoculated with 5 fluorouracil solution.  AK (actinic keratosis) (2) Right Forearm - Posterior; Right Breast  Destruction of lesion - Right Breast, Right Forearm - Posterior Complexity: simple   Destruction method: cryotherapy   Informed consent: discussed and consent obtained   Timeout:  patient name, date of birth, surgical site, and procedure verified Lesion destroyed using liquid nitrogen: Yes   Cryotherapy cycles:  3 Outcome: patient tolerated procedure well with no complications   Post-procedure details: wound care instructions given        I, [name], MD, have reviewed all documentation for this visit.  The documentation on 05/08/21 for the exam, diagnosis, procedures, and orders are all accurate and complete.

## 2021-06-01 ENCOUNTER — Ambulatory Visit: Payer: Medicare Other | Admitting: Gastroenterology

## 2021-06-04 ENCOUNTER — Other Ambulatory Visit: Payer: Self-pay

## 2021-06-04 ENCOUNTER — Other Ambulatory Visit: Payer: Self-pay | Admitting: Dermatology

## 2021-06-04 ENCOUNTER — Ambulatory Visit (INDEPENDENT_AMBULATORY_CARE_PROVIDER_SITE_OTHER): Payer: Medicare Other | Admitting: Dermatology

## 2021-06-04 DIAGNOSIS — D0439 Carcinoma in situ of skin of other parts of face: Secondary | ICD-10-CM

## 2021-06-04 DIAGNOSIS — C44612 Basal cell carcinoma of skin of right upper limb, including shoulder: Secondary | ICD-10-CM

## 2021-06-04 DIAGNOSIS — D099 Carcinoma in situ, unspecified: Secondary | ICD-10-CM

## 2021-06-04 DIAGNOSIS — D485 Neoplasm of uncertain behavior of skin: Secondary | ICD-10-CM

## 2021-06-04 DIAGNOSIS — L720 Epidermal cyst: Secondary | ICD-10-CM

## 2021-06-04 NOTE — Patient Instructions (Signed)

## 2021-06-21 ENCOUNTER — Encounter: Payer: Self-pay | Admitting: Dermatology

## 2021-06-21 NOTE — Progress Notes (Signed)
Follow-Up Visit   Subjective  Nicole Ewing is a 60 y.o. female who presents for the following: Procedure (Right zygomatic area cis and right shoulder bcc. ).  Biopsy proven skin cancers right cheek and shoulder plus new crust on right hand. Location:  Duration:  Quality:  Associated Signs/Symptoms: Modifying Factors:  Severity:  Timing: Context:   Objective  Well appearing patient in no apparent distress; mood and affect are within normal limits. Right Zygomatic Area Lesion identified by Dr.Onya Eutsler and nurse in room.    Right Dorsal Hand 8 mm thick pink crust, probable superficial carcinoma.  Right Shoulder - Anterior Lesion identified by Dr.Maymie Brunke and nurse in room.      A focused examination was performed including head, neck, arm. Relevant physical exam findings are noted in the Assessment and Plan.   Assessment & Plan    Squamous cell carcinoma in situ Right Zygomatic Area  Destruction of lesion Complexity: simple   Destruction method: electrodesiccation and curettage   Informed consent: discussed and consent obtained   Timeout:  patient name, date of birth, surgical site, and procedure verified Anesthesia: the lesion was anesthetized in a standard fashion   Anesthetic:  1% lidocaine w/ epinephrine 1-100,000 local infiltration Curettage performed in three different directions: Yes   Curettage cycles:  3 Lesion length (cm):  1.2 Lesion width (cm):  1.2 Margin per side (cm):  0 Final wound size (cm):  1.2 Hemostasis achieved with:  ferric subsulfate Outcome: patient tolerated procedure well with no complications   Post-procedure details: sterile dressing applied and wound care instructions given   Dressing type: bandage and petrolatum   Additional details:  Wound innoculated with 5 fluorouracil solution.  Neoplasm of uncertain behavior of skin Right Dorsal Hand  Skin / nail biopsy Type of biopsy: tangential   Informed consent: discussed and  consent obtained   Timeout: patient name, date of birth, surgical site, and procedure verified   Procedure prep:  Patient was prepped and draped in usual sterile fashion (Non sterile) Prep type:  Chlorhexidine Anesthesia: the lesion was anesthetized in a standard fashion   Anesthetic:  1% lidocaine w/ epinephrine 1-100,000 local infiltration Instrument used: flexible razor blade   Outcome: patient tolerated procedure well   Post-procedure details: wound care instructions given    Destruction of lesion Complexity: simple   Destruction method: electrodesiccation and curettage   Informed consent: discussed and consent obtained   Timeout:  patient name, date of birth, surgical site, and procedure verified Anesthesia: the lesion was anesthetized in a standard fashion   Anesthetic:  1% lidocaine w/ epinephrine 1-100,000 local infiltration Curettage performed in three different directions: Yes   Electrodesiccation performed over the curetted area: Yes   Curettage cycles:  3 Lesion length (cm):  1.5 Lesion width (cm):  1.5 Margin per side (cm):  0 Final wound size (cm):  1.5 Hemostasis achieved with:  ferric subsulfate and electrodesiccation Outcome: patient tolerated procedure well with no complications   Post-procedure details: sterile dressing applied and wound care instructions given   Dressing type: bandage and petrolatum   Additional details:  Wound innoculated with 5 fluorouracil solution.  Specimen 1 - Surgical pathology Differential Diagnosis: R/O BCC vs SCC - treated after biopsy [id number] Check Margins: No  After shave biopsy the base of the lesion was treated with curettage plus cautery.  Basal cell carcinoma (BCC) of skin of right upper extremity including shoulder Right Shoulder - Anterior  Destruction of lesion Complexity: simple   Destruction  method: electrodesiccation and curettage   Informed consent: discussed and consent obtained   Timeout:  patient name, date  of birth, surgical site, and procedure verified Anesthesia: the lesion was anesthetized in a standard fashion   Anesthetic:  1% lidocaine w/ epinephrine 1-100,000 local infiltration Curettage performed in three different directions: Yes     Electrodesiccation performed over the curetted area: No   Curettage cycles:  3 Lesion length (cm):  1 Lesion width (cm):  1 Margin per side (cm):  0 Final wound size (cm):  1 Hemostasis achieved with:  ferric subsulfate Outcome: patient tolerated procedure well with no complications   Post-procedure details: sterile dressing applied and wound care instructions given   Dressing type: bandage and petrolatum   Additional details:  Wound innoculated with 5 fluorouracil solution.  Milia Mid Upper Vermilion Lip      I, Nicole Monarch, MD, have reviewed all documentation for this visit.  The documentation on 06/21/21 for the exam, diagnosis, procedures, and orders are all accurate and complete.

## 2021-07-20 NOTE — Progress Notes (Signed)
Referring Provider: Rory Percy, MD Primary Care Physician:  Egan Nation, MD Primary GI Physician: Dr. Gala Romney  Chief Complaint  Patient presents with   Gastroesophageal Reflux    Doing fine     HPI:   Nicole Ewing is a 60 y.o. female presenting today for follow-up s/p EGD in April 2022 which was pursued due to dysphagia, intermittent epigastric abdominal pain, and reported dark stools.   History of GERD, elevated LFTs, previously with daily alcohol use and history of intranasal drug use in her 20s, diarrhea suspected to be secondary to lactose intolerance, constipation, unintentional weight loss. Colonoscopy up-to-date in March 2021 with one hyperplastic polyp, otherwise normal exam with recommendations to repeat in 5 years.  Extensive evaluation of LFTs previously:  Evidence of prior hepatitis C with undetectable RNA indicating past/resolved infection.  Hepatitis B serologies negative.  GGT normal.  ASMA negative.  ANA positive.  AMA elevated at 173.  Immunoglobulins wnl. Notably, AST/ALT normalized in September 2020.  Alk phos normalized in June 2021.  MRI/MRCP was ordered to help evaluate bile ducts/PBC. MRI with no intra or extrahepatic biliary dilation.  Liver unremarkable other than small cyst. Case was discussed with Dr. Gala Romney. Suspect elevated LFTs were likely secondary to chronic alcohol use. Less likely PBC as her LFTs have been normal since June 2021. Recommended we continue to follow her LFTs every 6 months and repeat AMA (September 2022) to see if this has improved.    Last seen in our office 03/02/2021.  Reported she had stopped drinking alcohol in January 2022.  No signs or symptoms of decompensated liver disease.  GERD well controlled on omeprazole 20 mg daily.  Admitted to intermittent epigastric abdominal pain x2-3 months.  Triggered by certain movements and possibly meals.  Also with solid food dysphagia without regurgitation, intermittent dark/black stools once  every week or 2 x 6 months.  Multivitamin contains low-dose iron.  Recently started taking Aleve due to right-sided back pain radiating to RUQ worse with coughing or taking a deep breath. Reported stable weight.  Constipation doing okay with MiraLAX as needed, but wondering about something she could take daily.   Plan included EGD with possible dilation,labs, RUQ [place], Colace 100 mg daily, MiraLAX as needed, continue omeprazole 20 mg daily.  Labs completed 02/22/2021: LFTs within normal limits, mild hyponatremia at 133, glucose elevated at 106, CBC essentially normal, I FOBT negative, immunoglobulins within normal limits. RUQ ultrasound 03/10/2021: Increased liver echogenicity, small hepatic cysts, gallbladder and CBD normal.  EGD 03/19/2021: Normal esophagus s/p dilation and biopsy, patchy erythema of gastric mucosa particularly the body with a polypoid appearance s/p biopsy, normal examined duodenum.  Pathology with chronic mild gastritis, negative for H. pylori.  Esophageal biopsy benign.  Today:  Dysphagia: Food dysphagia has resolved.   GERD: No reflux symptoms on Prilosec daily.   Epigastric abdominal pain: Resolved.   Constipation: Taking colace daily. Working well. Bms every other day. No brbpr or melena.   Weight loss:  Down 10 lbs since March 2022.  144 lbs June 2020. 131 lbs June 2021. 130 lbs March 2022. 120 lbs today.  Eating the same amount she always does. Eats a small breakfast. May or may not eat lunch. Eats a good dinner. Drinking Boost some days.  No abdominal pain, nausea, or vomiting. No early satiety. Feels stress is playing a role.  Recently lost her best friend and her son is a drug addict. Has follow-up with Dr. Dorris Fetch next months  regarding hyperthyroidism.Not taking anything for thyroid right now.  Intermittent low blood sugars she thinks due to intermittent shakiness that improves with eating.  Dr. Dorris Fetch recommended checking blood sugars.  Patient has received her meter,  but she has not been checking her blood sugar.  Past Medical History:  Diagnosis Date   Acid reflux    Anxiety    Arthritis    Cervical cancer (HCC)    COPD (chronic obstructive pulmonary disease) (HCC)    Depression    H/O: hysterectomy    Cervical dysplasia   High cholesterol    Hyperthyroidism    Menopause ovarian failure    Migraines     Past Surgical History:  Procedure Laterality Date   ABDOMINAL HYSTERECTOMY     BIOPSY  03/19/2021   Procedure: BIOPSY;  Surgeon: Daneil Dolin, MD;  Location: [facility];  Service: Endoscopy;;   BREAST SURGERY Right    breast buiopsy   COLONOSCOPY  04/06/2007   [facility]:  Normal rectum, no inflammation, no colonic or rectal polyps, tortuous and redundant colon. Recommendations to repeat TCS in 5 years (2013).    COLONOSCOPY WITH PROPOFOL N/A 02/07/2020   Procedure: COLONOSCOPY WITH PROPOFOL;  Surgeon: Daneil Dolin, MD;  1 hyperplastic polyp. Otherwise normal exam. Recommended repeat in 5 years.    ESOPHAGOGASTRODUODENOSCOPY (EGD) WITH PROPOFOL N/A 03/19/2021   Surgeon: Daneil Dolin, MD;  Normal esophagus s/p dilation and biopsy, patchy erythema of gastric mucosa particularly the body with a polypoid appearance s/p biopsy, normal examined duodenum.  Pathology with chronic mild gastritis, negative for H. pylori.  Esophageal biopsy benign.   MALONEY DILATION N/A 03/19/2021   Procedure: Venia Minks DILATION;  Surgeon: Daneil Dolin, MD;  Location: [facility];  Service: Endoscopy;  Laterality: N/A;   POLYPECTOMY  02/07/2020   Procedure: POLYPECTOMY;  Surgeon: Daneil Dolin, MD;  Location: [facility];  Service: Endoscopy;;   TONSILLECTOMY     TOTAL HIP ARTHROPLASTY Left 11/16/2018   TOTAL HIP ARTHROPLASTY Right 07/02/2019    Current Outpatient Medications  Medication Sig Dispense Refill   Blood Glucose Monitoring Suppl (ACCU-CHEK GUIDE ME) w/Device KIT 1 Piece by Does not apply route as directed. 1 kit 0   Calcium  Carbonate-Vitamin D (CALCIUM 600+D PO) Take 1 tablet by mouth daily.     diazepam (VALIUM) 5 MG tablet Take 5 mg by mouth in the morning and at bedtime.     docusate sodium (COLACE) 100 MG capsule Take 100 mg by mouth daily.     glucose blood (ACCU-CHEK GUIDE) test strip Only when you get symptoms of low glucose 50 each 2   HYDROcodone-acetaminophen (NORCO/VICODIN) 5-325 MG tablet Take 1 tablet by mouth every 8 (eight) hours as needed for severe pain.     Lancets MISC 1 each by Does not apply route as directed. 100 each 3   loratadine (CLARITIN) 10 MG tablet Take 10 mg by mouth daily.     methocarbamol (ROBAXIN) 750 MG tablet Take 750 mg by mouth 4 (four) times daily as needed for muscle spasms.     Multiple Vitamin (MULTIVITAMIN WITH MINERALS) TABS tablet Take 1 tablet by mouth daily.     naproxen sodium (ALEVE) 220 MG tablet Take 220 mg by mouth 2 (two) times daily as needed (pain).     omeprazole (PRILOSEC) 20 MG capsule Take 20 mg by mouth daily.     Probiotic Product (PROBIOTIC DAILY PO) Take 1 capsule by mouth daily.  vitamin B-12 (CYANOCOBALAMIN) 250 MCG tablet Take 250 mcg by mouth daily.     No current facility-administered medications for this visit.    Allergies as of 07/22/2021 - Review Complete 07/22/2021  Allergen Reaction Noted   Tramadol  06/05/2019   Codeine Rash 01/27/2017   Keflex [cephalexin] Rash 01/27/2017    Family History  Problem Relation Age of Onset   Melanoma Father    Graves' disease Child    Colon polyps Brother    Colon cancer Neg Hx     Social History   Socioeconomic History   Marital status: Married    Spouse name: Not on file   Number of children: Not on file   Years of education: Not on file   Highest education level: Not on file  Occupational History   Not on file  Tobacco Use   Smoking status: Every Day    Packs/day: 0.25    Years: 25.00    Pack years: 6.25    Types: Cigars, Cigarettes   Smokeless tobacco: Former   Tobacco  comments:    1-2 cigars daily  Vaping Use   Vaping Use: Never used  Substance and Sexual Activity   Alcohol use: Not Currently    Comment: 4-6 beers daily (decresed to 2 every other day -08/15/2020); quit January 2022   Drug use: Not Currently   Sexual activity: Yes    Birth control/protection: Surgical  Other Topics Concern   Not on file  Social History Narrative   Not on file   Social Determinants of Health   Financial Resource Strain: Not on file  Food Insecurity: Not on file  Transportation Needs: Not on file  Physical Activity: Not on file  Stress: Not on file  Social Connections: Not on file    Review of Systems: Gen: Denies fever, chills, cold or flulike symptoms, presyncope, syncope.  Continues to have some hair loss. CV: Denies chest pain, palpitations Resp: Denies dyspnea or cough GI: See HPI Derm: Denies rash Psych: Denies depression, anxiety Heme: See HPI  Physical Exam: BP 121/78   Pulse 96   Temp 97.6 F (36.4 C)   Ht '5\' 5"'  (1.651 m)   Wt 120 lb 3.2 oz (54.5 kg)   BMI 20.00 kg/m  General:   Alert and oriented. No distress noted. Pleasant and cooperative.  Head:  Normocephalic and atraumatic Eyes:  Conjuctiva clear without scleral icterus. Heart:  S1, S2 present without murmurs appreciated. Lungs:  Clear to auscultation bilaterally. No wheezes, rales, or rhonchi. No distress.  Abdomen:  +BS, soft, non-tender and non-distended. No rebound or guarding. No HSM or masses noted. Msk:  Symmetrical without gross deformities. Normal posture. Extremities:  Without edema. Neurologic:  Alert and  oriented x4 Psych: Normal mood and affect.   Assessment: 60 year old female with history of GERD, elevated LFTs with extensive evaluation and thought to be secondary to chronic alcohol use, lactose intolerance, mild constipation, unintentional weight loss, reported dysphagia and epigastric pain in March 2022 presenting today for follow-up.  Dysphagia: Resolved s/p  EGD in April 2022 revealing normal esophagus s/p empiric dilation and biopsy which was benign.  Patient may have had an esophageal web that was disrupted.  Continue to monitor.  GERD: Well-controlled on Prilosec 20 mg daily.  No alarm symptoms.  Previously reported epigastric pain has resolved.  Recent EGD with mild chronic gastritis.   Constipation: Well-controlled with Colace daily.  No alarm symptoms.  Colonoscopy up-to-date March 2021 with 1 hyperplastic polyp, otherwise  normal and due for repeat in 2026.  Weight loss: Documented 24 pound weight loss over the last 2 years, 10 pound weight loss over the last 5 months.  Weight loss has been unintentional.  Initially thought this was secondary to new diagnosis of hyperthyroidism and decreasing daily alcohol consumption.  Weight seemed to stabilize between June 2021 and March 2022, but now with additional weight loss which is concerning.  Patient feels stress may be playing a role.  No dietary changes though it does not sound like she consumes enough calories per day.  Notably, she is not on anything for hyperthyroidism.  Also reports intermittent "shakiness" that improves with eating, and increased thirst, thus query component of diabetes.  Colonoscopy up-to-date in March 2021.  Recent EGD April 2022.  MRI/MRCP December 2020 with mild retroperitoneal and mesenteric lymphadenopathy in the abdomen.  Follow-up CT A/P with contrast December 2020 with persistent retroperitoneal adenopathy of unknown clinical significance measuring up to approximately 1 cm, not amendable to biopsy due to small size.  Also with an area of focal thickening involving the anterior bladder wall which was followed by cystoscopy with urology that was normal.  Suspected bladder findings were secondary to decompression with redundancy of urothelium.   I am concerned about her ongoing weight loss.  Hyperthyroidism as well as possible component of diabetes could very well be playing a role  here as well as low calorie intake.  Discussed the possibility of repeating a CT of her abdomen and pelvis to follow-up on prior lymphadenopathy.  Patient prefers to hold off until she has her follow-up with Dr. Dorris Fetch in September.  She will contact us if she would like to pursue a CT.  Also discussed following up with PCP to consider imaging of her lungs.  Advised 3 meals a day and adding 2 boost per day.  She is to monitor for ongoing weight loss.  Elevated LFTs: In the setting of chronic alcohol use, quit in January 2022.  Extensive evaluation previously, detailed in HPI.  LFTs normalized in June 2021 and have remained normal thus far with last HFP March 2022.  Due to elevated AMA previously, case had been discussed with Dr. Gala Romney who recommended we continue to monitor LFTs every 6 months and repeat an AMA with her next HFP to see if this has improved since alcohol cessation.  Overall, less likely PBC as LFTs have remained normal.  We will repeat HFP and AMA in September.  Orders were placed today.   Plan: 1.  Repeat HFP and AMA September 2022. 2.  Hold off on CT A/P with contrast per patient's request until she has follow-up with Dr. Dorris Fetch in September.  She will contact us if she would like to pursue CT for further evaluation of weight loss. 3.  Advise she discuss weight loss with PCP as well for consideration of imaging of her lungs. 4.  3 meals a day and 2 boost per day. 5.  Monitor for ongoing weight loss. 6.  Continue Prilosec 20 mg daily 30 minutes before breakfast. 7.  Continue Colace daily. 8.  Follow-up in 4 months or sooner if needed.    Aliene Altes, PA-C [facility] 07/22/2021

## 2021-07-22 ENCOUNTER — Ambulatory Visit: Payer: Medicare Other | Admitting: Gastroenterology

## 2021-07-22 ENCOUNTER — Encounter: Payer: Self-pay | Admitting: Gastroenterology

## 2021-07-22 ENCOUNTER — Other Ambulatory Visit: Payer: Self-pay

## 2021-07-22 VITALS — BP 121/78 | HR 96 | Temp 97.6°F | Ht 65.0 in | Wt 120.2 lb

## 2021-07-22 DIAGNOSIS — R1310 Dysphagia, unspecified: Secondary | ICD-10-CM | POA: Diagnosis not present

## 2021-07-22 DIAGNOSIS — K921 Melena: Secondary | ICD-10-CM | POA: Diagnosis not present

## 2021-07-22 DIAGNOSIS — R1013 Epigastric pain: Secondary | ICD-10-CM

## 2021-07-22 DIAGNOSIS — K5900 Constipation, unspecified: Secondary | ICD-10-CM

## 2021-07-22 DIAGNOSIS — R634 Abnormal weight loss: Secondary | ICD-10-CM

## 2021-07-22 DIAGNOSIS — K219 Gastro-esophageal reflux disease without esophagitis: Secondary | ICD-10-CM

## 2021-07-22 DIAGNOSIS — R7989 Other specified abnormal findings of blood chemistry: Secondary | ICD-10-CM

## 2021-07-22 NOTE — Patient Instructions (Signed)
As you requested, we will hold off on a CT scan for now to further evaluate your weight loss until you have had follow-up with Dr. Dorris Fetch to ensure your thyroid function is normal.  I placed orders to update your liver enzymes and check one of the antibodies that was previously positive.  Please have these labs completed in September when you have blood work with Dr. Dorris Fetch.  Please let us know if you would like to proceed with a CT scan after you see Dr. Dorris Fetch.  Recommend eating at least 3 meals daily and adding 2 boost daily to help maintain your weight.  Continue Prilosec 20 mg daily 30 minutes for breakfast.  Continue Colace daily for constipation.  We will see back in about 4 months.  Do not hesitate to call if you have questions or concerns prior to her next visit.   It was great to see you today!   Aliene Altes, PA-C [facility]

## 2021-08-17 ENCOUNTER — Other Ambulatory Visit: Payer: Self-pay

## 2021-08-17 DIAGNOSIS — R768 Other specified abnormal immunological findings in serum: Secondary | ICD-10-CM

## 2021-08-17 DIAGNOSIS — R7989 Other specified abnormal findings of blood chemistry: Secondary | ICD-10-CM

## 2021-08-26 ENCOUNTER — Other Ambulatory Visit (HOSPITAL_COMMUNITY)
Admission: RE | Admit: 2021-08-26 | Discharge: 2021-08-26 | Disposition: A | Discharge status: Home / Self Care | Payer: Medicare Other | Source: Ambulatory Visit | Attending: Internal Medicine | Admitting: Internal Medicine

## 2021-08-26 ENCOUNTER — Telehealth: Payer: Self-pay | Admitting: Internal Medicine

## 2021-08-26 ENCOUNTER — Other Ambulatory Visit (HOSPITAL_COMMUNITY)
Admission: RE | Admit: 2021-08-26 | Discharge: 2021-08-26 | Disposition: A | Discharge status: Home / Self Care | Payer: Medicare Other | Source: Ambulatory Visit | Attending: "Endocrinology | Admitting: "Endocrinology

## 2021-08-26 DIAGNOSIS — E0520 Thyrotoxicosis with toxic multinodular goiter without thyrotoxic crisis or storm: Secondary | ICD-10-CM | POA: Diagnosis present

## 2021-08-26 DIAGNOSIS — R7989 Other specified abnormal findings of blood chemistry: Secondary | ICD-10-CM | POA: Insufficient documentation

## 2021-08-26 DIAGNOSIS — R768 Other specified abnormal immunological findings in serum: Secondary | ICD-10-CM | POA: Insufficient documentation

## 2021-08-26 LAB — COMPREHENSIVE METABOLIC PANEL
ALT: 14 U/L (ref 0–44)
AST: 21 U/L (ref 15–41)
Albumin: 4 g/dL (ref 3.5–5.0)
Alkaline Phosphatase: 55 U/L (ref 38–126)
Anion gap: 8 (ref 5–15)
BUN: 8 mg/dL (ref 6–20)
CO2: 23 mmol/L (ref 22–32)
Calcium: 8.8 mg/dL — ABNORMAL LOW (ref 8.9–10.3)
Chloride: 97 mmol/L — ABNORMAL LOW (ref 98–111)
Creatinine, Ser: 0.51 mg/dL (ref 0.44–1.00)
GFR, Estimated: >60 mL/min (ref >60)
Glucose, Bld: 104 mg/dL — ABNORMAL HIGH (ref 70–99)
Potassium: 4.1 mmol/L (ref 3.5–5.1)
Sodium: 128 mmol/L — ABNORMAL LOW (ref 135–145)
Total Bilirubin: 0.2 mg/dL — ABNORMAL LOW (ref 0.3–1.2)
Total Protein: 7.1 g/dL (ref 6.5–8.1)

## 2021-08-26 LAB — HEPATIC FUNCTION PANEL
ALT: 14 U/L (ref 0–44)
AST: 22 U/L (ref 15–41)
Albumin: 4 g/dL (ref 3.5–5.0)
Alkaline Phosphatase: 55 U/L (ref 38–126)
Bilirubin, Direct: <0.1 mg/dL (ref 0.0–0.2)
Total Bilirubin: 0.3 mg/dL (ref 0.3–1.2)
Total Protein: 7.1 g/dL (ref 6.5–8.1)

## 2021-08-26 LAB — CORTISOL-AM, BLOOD: Cortisol - AM: 4.7 ug/dL — ABNORMAL LOW (ref 6.7–22.6)

## 2021-08-26 LAB — T4, FREE: Free T4: 0.78 ng/dL (ref 0.61–1.12)

## 2021-08-26 LAB — TSH: TSH: 0.699 u[IU]/mL (ref 0.350–4.500)

## 2021-08-26 NOTE — Telephone Encounter (Signed)
Nicole Ewing HAS A QUESTION ABOUT HER LABS

## 2021-08-26 NOTE — Telephone Encounter (Signed)
Pt called to make sure it was ok for her to have her labs drawn today while she was at Dr. Liliane Channel office. Informed pt that it was definitely ok for her to have them done. Pt verbalized understanding.

## 2021-08-27 LAB — T3, FREE: T3, Free: 2.8 pg/mL (ref 2.0–4.4)

## 2021-08-27 LAB — MITOCHONDRIAL ANTIBODIES: Mitochondrial M2 Ab, IgG: 146.1 Units — ABNORMAL HIGH (ref 0.0–20.0)

## 2021-09-02 ENCOUNTER — Ambulatory Visit: Payer: Medicare Other | Admitting: "Endocrinology

## 2021-09-02 ENCOUNTER — Encounter: Payer: Self-pay | Admitting: "Endocrinology

## 2021-09-02 ENCOUNTER — Other Ambulatory Visit: Payer: Self-pay

## 2021-09-02 VITALS — BP 136/78 | HR 76 | Ht 65.0 in | Wt 122.2 lb

## 2021-09-02 DIAGNOSIS — E871 Hypo-osmolality and hyponatremia: Secondary | ICD-10-CM | POA: Diagnosis not present

## 2021-09-02 DIAGNOSIS — E0520 Thyrotoxicosis with toxic multinodular goiter without thyrotoxic crisis or storm: Secondary | ICD-10-CM

## 2021-09-02 NOTE — Progress Notes (Signed)
Pt experiencing increased hair loss over the past 6 months.

## 2021-09-02 NOTE — Progress Notes (Signed)
                                                 09/02/2021, 11:53 AM                        Endocrinology follow-up note  Subjective:    Patient ID: Nicole Ewing, female    DOB: 09/25/1961, PCP Williams, Gordon F, MD   Past Medical History:  Diagnosis Date   Acid reflux    Anxiety    Arthritis    Cervical cancer (HCC)    COPD (chronic obstructive pulmonary disease) (HCC)    Depression    H/O: hysterectomy    Cervical dysplasia   High cholesterol    Hyperthyroidism    Menopause ovarian failure    Migraines    Past Surgical History:  Procedure Laterality Date   ABDOMINAL HYSTERECTOMY     BIOPSY  03/19/2021   Procedure: BIOPSY;  Surgeon: Rourk, Robert M, MD;  Location: [facility];  Service: Endoscopy;;   BREAST SURGERY Right    breast buiopsy   COLONOSCOPY  04/06/2007   [facility]:  Normal rectum, no inflammation, no colonic or rectal polyps, tortuous and redundant colon. Recommendations to repeat TCS in 5 years (2013).    COLONOSCOPY WITH PROPOFOL N/A 02/07/2020   Procedure: COLONOSCOPY WITH PROPOFOL;  Surgeon: Rourk, Robert M, MD;  1 hyperplastic polyp. Otherwise normal exam. Recommended repeat in 5 years.    ESOPHAGOGASTRODUODENOSCOPY (EGD) WITH PROPOFOL N/A 03/19/2021   Surgeon: Rourk, Robert M, MD;  Normal esophagus s/p dilation and biopsy, patchy erythema of gastric mucosa particularly the body with a polypoid appearance s/p biopsy, normal examined duodenum.  Pathology with chronic mild gastritis, negative for H. pylori.  Esophageal biopsy benign.   MALONEY DILATION N/A 03/19/2021   Procedure: MALONEY DILATION;  Surgeon: Rourk, Robert M, MD;  Location: [facility];  Service: Endoscopy;  Laterality: N/A;   POLYPECTOMY  02/07/2020   Procedure: POLYPECTOMY;  Surgeon: Rourk, Robert M, MD;  Location: [facility];  Service: Endoscopy;;   TONSILLECTOMY     TOTAL HIP ARTHROPLASTY Left 11/16/2018   TOTAL HIP ARTHROPLASTY Right 07/02/2019   Social  History   Socioeconomic History   Marital status: Married    Spouse name: Not on file   Number of children: Not on file   Years of education: Not on file   Highest education level: Not on file  Occupational History   Not on file  Tobacco Use   Smoking status: Every Day    Packs/day: 0.25    Years: 25.00    Pack years: 6.25    Types: Cigars, Cigarettes   Smokeless tobacco: Former   Tobacco comments:    1-2 cigars daily  Vaping Use   Vaping Use: Never used  Substance and Sexual Activity   Alcohol use: Not Currently    Comment: 4-6 beers daily (decresed to 2 every other day -08/15/2020); quit January 2022   Drug use: Not Currently   Sexual activity: Yes    Birth control/protection: Surgical  Other Topics Concern   Not on file  Social History Narrative   Not on file   Social Determinants of Health   Financial Resource Strain: Not on file  Food Insecurity: Not on file  Transportation Needs: Not on file    Physical Activity: Not on file  Stress: Not on file  Social Connections: Not on file   Family History  Problem Relation Age of Onset   Melanoma Father    Berenice Primas' disease Child    Colon polyps Brother    Colon cancer Neg Hx    Outpatient Encounter Medications as of 09/02/2021  Medication Sig   Blood Glucose Monitoring Suppl (ACCU-CHEK GUIDE ME) w/Device KIT 1 Piece by Does not apply route as directed.   Calcium Carbonate-Vitamin D (CALCIUM 600+D PO) Take 1 tablet by mouth daily.   diazepam (VALIUM) 5 MG tablet Take 5 mg by mouth in the morning and at bedtime.   docusate sodium (COLACE) 100 MG capsule Take 100 mg by mouth daily.   glucose blood (ACCU-CHEK GUIDE) test strip Only when you get symptoms of low glucose   HYDROcodone-acetaminophen (NORCO/VICODIN) 5-325 MG tablet Take 1 tablet by mouth every 8 (eight) hours as needed for severe pain.   Lancets MISC 1 each by Does not apply route as directed.   loratadine (CLARITIN) 10 MG tablet Take 10 mg by mouth daily.    methocarbamol (ROBAXIN) 750 MG tablet Take 750 mg by mouth 4 (four) times daily as needed for muscle spasms.   Multiple Vitamin (MULTIVITAMIN WITH MINERALS) TABS tablet Take 1 tablet by mouth daily.   naproxen sodium (ALEVE) 220 MG tablet Take 220 mg by mouth 2 (two) times daily as needed (pain).   omeprazole (PRILOSEC) 20 MG capsule Take 20 mg by mouth daily.   Probiotic Product (PROBIOTIC DAILY PO) Take 1 capsule by mouth daily.   vitamin B-12 (CYANOCOBALAMIN) 250 MCG tablet Take 250 mcg by mouth daily.   No facility-administered encounter medications on file as of 09/02/2021.   ALLERGIES: Allergies  Allergen Reactions   Tramadol     Sick on stomach    Codeine Rash   Keflex [Cephalexin] Rash    VACCINATION STATUS:  There is no immunization history on file for this patient.  HPI Nicole Ewing is 60 y.o. female who is returning with repeat thyroid function test after she was seen in consultation for hyperthyroidism.    PMD: [place] Nation, MD. She was diagnosed with hyperthyroidism in May 2020 when she was treated with methimazole with adequate treatment response.  She is currently not on any antithyroid intervention.  Her previsit thyroid function tests are still consistent with euthyroid state.   She has no new complaints today.  She reports that she quit alcohol consumption since last visit.   Previsit labs show hyponatremia.  She is known to have chronic/subacute hyponatremia. Prior to her last visit: -Her thyroid uptake and scan on October 23, 2019 showed 25.6% 24-hour uptake.  The thyroid  Scan was reported to be inhomogeneous with relative increased tracer accumulation at  right superior and left inferior lobes and relative decreased uptake at the right inferior pole suggesting presence of at least one thyroid nodule and ultrasound of the thyroid confirms same.    She underwent fine-needle biopsy of this nodule with benign findings.   Currently, she denies palpitations,  heat intolerance, tremors.  She does have family history of thyroid dysfunction in her daughter.  She remains active smoker.  She denies dysphagia, shortness of breath, nor voice change. She admits to upto 6 beers daily.  Review of Systems Limited as above.  Objective:    BP 136/78   Pulse 76   Ht 5' 5" (1.651 m)   Wt 122 lb 3.2 oz (  55.4 kg)   BMI 20.34 kg/m   Wt Readings from Last 3 Encounters:  09/02/21 122 lb 3.2 oz (55.4 kg)  07/22/21 120 lb 3.2 oz (54.5 kg)  03/02/21 130 lb 6.4 oz (59.1 kg)    Physical Exam  CMP ( most recent) CMP     Component Value Date/Time   NA 128 (L) 08/26/2021 1128   K 4.1 08/26/2021 1128   CL 97 (L) 08/26/2021 1128   CO2 23 08/26/2021 1128   GLUCOSE 104 (H) 08/26/2021 1128   BUN 8 08/26/2021 1128   BUN 7 04/26/2019 0000   CREATININE 0.51 08/26/2021 1128   CREATININE 0.43 (L) 06/05/2019 1446   CALCIUM 8.8 (L) 08/26/2021 1128   PROT 7.1 08/26/2021 1128   ALBUMIN 4.0 08/26/2021 1128   AST 21 08/26/2021 1128   ALT 14 08/26/2021 1128   ALKPHOS 55 08/26/2021 1128   BILITOT 0.2 (L) 08/26/2021 1128   GFRNONAA >60 08/26/2021 1128   GFRNONAA 112 06/05/2019 1446   GFRAA >60 12/04/2019 1353   GFRAA 130 06/05/2019 1446    Lipid Panel     Component Value Date/Time   CHOL 188 04/26/2019 0000   TRIG 92 04/26/2019 0000   HDL 68 04/26/2019 0000   LDLCALC 102 04/26/2019 0000      Lab Results  Component Value Date   TSH 0.699 08/26/2021   TSH 0.593 02/23/2021   TSH 1.110 08/28/2020   TSH 0.543 02/25/2020   TSH 0.95 09/26/2019   TSH 0.01 (A) 04/26/2019   TSH 0.01 (A) 04/26/2019   FREET4 0.78 08/26/2021   FREET4 0.86 02/23/2021   FREET4 0.75 08/28/2020   FREET4 0.69 02/25/2020   FREET4 0.7 (L) 09/26/2019      Assessment & Plan:   1. Hyperthyroidism-resolved status post methimazole treatment.   2. hyponatremia Based on her previsit thyroid function tests, she is euthyroid.  She maintains euthyroid state after she was briefly  treated with methimazole prior to her last visit.  She will not need any antithyroid intervention at this time.   -Regarding her hyponatremia, this is a chronic problem for her.  Further interview reveals that she over drinks water due to dry mouth.  She drinks more than a gallon a day.  I advised her to cut free water by half and can make up for it by increasing her intake of electrolyte liquids like Gatorade/tea.  -She has a negative fine-needle aspiration of a thyroid nodule.    The patient was counseled on the dangers of tobacco use, and was advised to quit.  Reviewed strategies to maximize success, including removing cigarettes and smoking materials from environment.   -Her recent labs showed correction of her previously documented hyper transaminases.  I did not initiate any new prescriptions today.  - I advised her  to maintain close follow up with Williams, Gordon F, MD for primary care needs.   I spent 21 minutes in the care of the patient today including review of labs from Thyroid Function, CMP, and other relevant labs ; imaging/biopsy records (current and previous including abstractions from other facilities); face-to-face time discussing  her lab results and symptoms, medications doses, her options of short and long term treatment based on the latest standards of care / guidelines;   and documenting the encounter.  Nicole Ewing  participated in the discussions, expressed understanding, and voiced agreement with the above plans.  All questions were answered to her satisfaction. she is encouraged to contact clinic should she   have any questions or concerns prior to her return visit.     Follow up plan: Return in about 6 months (around 03/02/2022) for F/U with Pre-visit Labs.   Glade Lloyd, MD [facility] [address] [place], [place] [address] Phone: [phone]  Fax: [phone]     09/02/2021, 11:53 AM  This note  was partially dictated with voice recognition software. Similar sounding words can be transcribed inadequately or may not  be corrected upon review.

## 2021-09-03 ENCOUNTER — Other Ambulatory Visit: Payer: Self-pay | Admitting: "Endocrinology

## 2021-09-16 ENCOUNTER — Ambulatory Visit: Payer: Medicare Other | Admitting: Dermatology

## 2021-09-16 ENCOUNTER — Other Ambulatory Visit: Payer: Self-pay

## 2021-09-16 ENCOUNTER — Encounter: Payer: Self-pay | Admitting: Dermatology

## 2021-09-16 DIAGNOSIS — L570 Actinic keratosis: Secondary | ICD-10-CM

## 2021-09-16 MED ORDER — AMINOLEVULINIC ACID HCL 10 % EX GEL
2000.0000 mg | Freq: Once | CUTANEOUS | Status: AC
  Administered 2021-09-16: 2000 mg via TOPICAL

## 2021-09-16 NOTE — Patient Instructions (Signed)

## 2021-09-30 DIAGNOSIS — M810 Age-related osteoporosis without current pathological fracture: Secondary | ICD-10-CM | POA: Insufficient documentation

## 2021-10-02 ENCOUNTER — Encounter: Payer: Self-pay | Admitting: Dermatology

## 2021-10-02 NOTE — Progress Notes (Signed)
   Follow-Up Visit   Subjective  Nicole Ewing is a 60 y.o. female who presents for the following: Photodynamic Therapy (Destruction done by Dr Denna Haggard ).  Diffuse and hypertrophic facial actinic keratoses, here for PDT Location:  Duration:  Quality:  Associated Signs/Symptoms: Modifying Factors:  Severity:  Timing: Context:   Objective  Well appearing patient in no apparent distress; mood and affect are within normal limits. Head Debridement of thick AK's by curettage on the face done by Dr Denna Haggard, patient tolerated the treatment well.           A focused examination was performed including head and neck. Relevant physical exam findings are noted in the Assessment and Plan.   Assessment & Plan    AK (actinic keratosis) Head  Patient advised to call the office if she has any questions and follow up in 12 weeks   Photodynamic therapy - Head Procedure discussed: discussed risks, benefits, side effects. and alternatives   Prep: site scrubbed/prepped with acetone   Debridement needed: Yes   Location:  Face Number of lesions:  2 Type of treatment:  Blue light Aminolevulinic Acid (see MAR for details): Ameluz Amount of Ameluz (mg):  2000 Incubation time (minutes):  90 Number of minutes under lamp:  16 Number of seconds under lamp:  45 Cooling:  Fan and floor fan Outcome: patient tolerated procedure well with no complications   Post-procedure details: sunscreen applied and aftercare instructions given to patient   Additional details:  Nancy Fetter hat given to patient when she left  Aminolevulinic Acid HCl 10 % GEL 2,000 mg - Head       I, [name], MD, have reviewed all documentation for this visit.  The documentation on 10/02/21 for the exam, diagnosis, procedures, and orders are all accurate and complete.

## 2021-11-09 DIAGNOSIS — M7060 Trochanteric bursitis, unspecified hip: Secondary | ICD-10-CM | POA: Insufficient documentation

## 2021-12-09 NOTE — Progress Notes (Signed)
Referring Provider: [place] Nation, MD Primary Care Physician:  [place] Nation, MD Primary GI Physician: Dr. Gala Romney  Chief Complaint  Patient presents with   Gastroesophageal Reflux    C/o reflux coming back up at night in throat, burning. Eating ice cream at night   Weight Loss    HPI:   Nicole Ewing is a 61 y.o. female presenting today for follow-up. She has history of GERD, elevated LFTs, previously with daily alcohol use (abstinent since January 2022) and history of intranasal drug use in her 44s, diarrhea suspected to be secondary lactose intolerance, constipation, unintentional weight loss. Recent EGD and colonoscopy up to date, detailed in surgical history, due for surveillance colonoscopy in 2026.   Extensive evaluation of LFTs previously: Evidence of prior hepatitis C with undetectable RNA indicating past/resolved infection.  Hepatitis B serologies negative.  GGT normal.  ASMA negative.  ANA positive.  AMA elevated at 173.  Immunoglobulins wnl.  MRI with no intra or extrahepatic biliary dilation.  Liver unremarkable other than small cyst. AST/ALT normalized in September 2020.  Alk phos normalized in June 2021.   RUQ ultrasound 03/10/2021: Increased liver echogenicity, small hepatic cysts, gallbladder and CBD normal.  Last seen in our office 07/22/2021.  Dysphagia resolved s/p dilation.  GERD well controlled on Prilosec daily.  Epigastric abdominal pain resolved.  Constipation well controlled with Colace daily.  She has lost 10 pounds in the last 5 months, total of 24 pound weight loss over the last 2 years.  Stated she was eating the same amount as she always does, typically 2 meals per day.  Denied nausea, vomiting, early satiety.  Felt stress was playing a role. She was not taking anything for hyperthyroidism. She also reported intermittent shakiness that improved with eating and increased thirst, with some concern for underlying diabetes and Dr. Dorris Fetch requesting patient  to monitor blood sugars.  Suspect hyperthyroidism, possible component of diabetes, and overall low calorie intake likely contributing to weight loss; however, I did offer a CT scan for further evaluation and to follow-up on previously noted lymphadenopathy on CT, but patient preferred to hold off on this for now.  Encourage 3 meals per day, 2 boost per day, and monitor for ongoing weight loss.  Regarding previously elevated LFTs, case had been discussed with Dr. Gala Romney who recommended we continue to monitor LFTs every 6 months and repeat an AMA with her next HFP to see if this has improved since alcohol cessation.  Planned for labs in September.   Labs in September: LFTs remain normal.  AMA remains elevated, but down to 146 from 173.  Dr. Gala Romney recommended monitoring LFTs for now and continuing to avoid alcohol.  Labs ordered by Dr. Dorris Fetch revealed euthyroidism, hyponatremia, and low a.m. cortisol at 4.7.  Dr. Dorris Fetch thought her hyponatremia was secondary to excessive water intake and advised to cut her free water by half.  No new medications prescribed.  Planned to follow-up in 6 months.  Today:  Weight loss:  144 lbs June 2020. 131 lbs June 2021. 130 lbs March 2022. 120 lbs August 2022 126 lbs today.   States she has started eating ice cream at night to try to gain weight.  She is not drinking protein shakes right now.  She was drinking boost, but states this was costly.  Continues to eat 1 main meal per day, dinner.  Also with popcorn and ice cream in the evening.  Often skips breakfast as she drinks a  lot of coffee in the morning.  Has never been a big lunch eater.  GERD:  Taking omeprazole 20 mg daily. Feels like she has stuff coming up, burning.  Breakthrough occurs 3-4 days week. Often in the evening, but can occur any time.  Wondering if she is losing effect to omeprazole.  Denies dysphagia, nausea, vomiting, or abdominal pain.   Mild constipation. Taking colace 200 mg daily to help with this.  Stools are soft, but skips a couple days here and there. No brbpr or melena.   Alcohol: None.  No OTC supplements.  Only taking the Tylenol that is included in her pain medication.  Past Medical History:  Diagnosis Date   Acid reflux    Anxiety    Arthritis    Cervical cancer (HCC)    COPD (chronic obstructive pulmonary disease) (HCC)    Depression    H/O: hysterectomy    Cervical dysplasia   High cholesterol    Hyperthyroidism    Menopause ovarian failure    Migraines    Osteoporosis     Past Surgical History:  Procedure Laterality Date   ABDOMINAL HYSTERECTOMY     BIOPSY  03/19/2021   Procedure: BIOPSY;  Surgeon: Daneil Dolin, MD;  Location: [facility];  Service: Endoscopy;;   BREAST SURGERY Right    breast buiopsy   COLONOSCOPY  04/06/2007   [facility]:  Normal rectum, no inflammation, no colonic or rectal polyps, tortuous and redundant colon. Recommendations to repeat TCS in 5 years (2013).    COLONOSCOPY WITH PROPOFOL N/A 02/07/2020   Procedure: COLONOSCOPY WITH PROPOFOL;  Surgeon: Daneil Dolin, MD;  1 hyperplastic polyp. Otherwise normal exam. Recommended repeat in 5 years.    ESOPHAGOGASTRODUODENOSCOPY (EGD) WITH PROPOFOL N/A 03/19/2021   Surgeon: Daneil Dolin, MD;  Normal esophagus s/p dilation and biopsy, patchy erythema of gastric mucosa particularly the body with a polypoid appearance s/p biopsy, normal examined duodenum.  Pathology with chronic mild gastritis, negative for H. pylori.  Esophageal biopsy benign.   MALONEY DILATION N/A 03/19/2021   Procedure: Venia Minks DILATION;  Surgeon: Daneil Dolin, MD;  Location: [facility];  Service: Endoscopy;  Laterality: N/A;   POLYPECTOMY  02/07/2020   Procedure: POLYPECTOMY;  Surgeon: Daneil Dolin, MD;  Location: [facility];  Service: Endoscopy;;   TONSILLECTOMY     TOTAL HIP ARTHROPLASTY Left 11/16/2018   TOTAL HIP ARTHROPLASTY Right 07/02/2019    Current Outpatient Medications  Medication  Sig Dispense Refill   Blood Glucose Monitoring Suppl (ACCU-CHEK GUIDE ME) w/Device KIT 1 Piece by Does not apply route as directed. 1 kit 0   Calcium Carbonate-Vitamin D (CALCIUM 600+D PO) 1269m calcium, 10056mvit D     diazepam (VALIUM) 5 MG tablet Take 5 mg by mouth in the morning and at bedtime.     docusate sodium (COLACE) 100 MG capsule Take 200 mg by mouth daily.     glucose blood (ACCU-CHEK GUIDE) test strip USE ONLY WHEN YOU GET SYMPTOMS OF LOW GLUCOSE 50 strip 2   HYDROcodone-acetaminophen (NORCO/VICODIN) 5-325 MG tablet Take 1 tablet by mouth every 8 (eight) hours as needed for severe pain.     Lancets MISC 1 each by Does not apply route as directed. 100 each 3   loratadine (CLARITIN) 10 MG tablet Take 10 mg by mouth daily.     methocarbamol (ROBAXIN) 750 MG tablet Take 750 mg by mouth 4 (four) times daily as needed for muscle spasms.  Multiple Vitamin (MULTIVITAMIN WITH MINERALS) TABS tablet Take 1 tablet by mouth daily.     naproxen sodium (ALEVE) 220 MG tablet Take 220 mg by mouth 2 (two) times daily as needed (pain).     pantoprazole (PROTONIX) 20 MG tablet Take 1 tablet (20 mg total) by mouth daily before breakfast. 30 tablet 3   Probiotic Product (PROBIOTIC DAILY PO) Take 1 capsule by mouth daily.     vitamin B-12 (CYANOCOBALAMIN) 250 MCG tablet Take 250 mcg by mouth daily.     Zoledronic Acid (RECLAST IV) Inject into the vein. Once a year. Last had 10/07/21     No current facility-administered medications for this visit.    Allergies as of 12/10/2021 - Review Complete 12/10/2021  Allergen Reaction Noted   Tramadol  06/05/2019   Codeine Rash 01/27/2017   Keflex [cephalexin] Rash 01/27/2017    Family History  Problem Relation Age of Onset   Melanoma Father    Graves' disease Child    Colon polyps Brother    Colon cancer Neg Hx     Social History   Socioeconomic History   Marital status: Married    Spouse name: Not on file   Number of children: Not on file    Years of education: Not on file   Highest education level: Not on file  Occupational History   Not on file  Tobacco Use   Smoking status: Every Day    Packs/day: 0.25    Years: 25.00    Pack years: 6.25    Types: Cigars, Cigarettes   Smokeless tobacco: Former   Tobacco comments:    1-2 cigars daily  Vaping Use   Vaping Use: Never used  Substance and Sexual Activity   Alcohol use: Not Currently    Comment: 4-6 beers daily (decresed to 2 every other day -08/15/2020); quit January 2022   Drug use: Not Currently   Sexual activity: Yes    Birth control/protection: Surgical  Other Topics Concern   Not on file  Social History Narrative   Not on file   Social Determinants of Health   Financial Resource Strain: Not on file  Food Insecurity: Not on file  Transportation Needs: Not on file  Physical Activity: Not on file  Stress: Not on file  Social Connections: Not on file    Review of Systems: Gen: Denies fever, chills, cold or flulike symptoms, presyncope, syncope. CV: Denies chest pain, palpitations. Resp: Denies dyspnea or cough. GI: See HPI Derm: Reports dry skin, but denies rash or significant itching. Heme: See HPI  Physical Exam: BP 121/80    Pulse 76    Temp 97.9 F (36.6 C)    Ht '5\' 5"'  (1.651 m)    Wt 126 lb (57.2 kg)    BMI 20.97 kg/m  General:   Alert and oriented. No distress noted. Pleasant and cooperative.  Head:  Normocephalic and atraumatic. Eyes:  Conjuctiva clear without scleral icterus. Heart:  S1, S2 present without murmurs appreciated. Lungs:  Clear to auscultation bilaterally. No wheezes, rales, or rhonchi. No distress.  Abdomen:  +BS, soft, non-tender and non-distended. No rebound or guarding. No HSM or masses noted. Msk:  Symmetrical without gross deformities. Normal posture. Extremities:  Without edema. Neurologic:  Alert and  oriented x4 Psych:  Normal mood and affect.    Assessment: 61 year old female with history of GERD, dysphagia with  good response to empiric dilation in March 2022, elevated LFTs, lactose intolerance, mild constipation, unintentional weight loss, presenting  today for follow-up.   Weight loss:  Patient had documented 24 pound unintentional weight loss over the last 2 years, 10 pound weight loss between March and August 2022.  Encouragingly, she has gained 6 pounds in the last 5 months.  Reports she started eating ice cream at night to help her gain weight. Boost are expensive. Weight loss has likely been multifactorial in the setting of newly diagnosed hyperthyroidism, alcohol cessation from daily alcohol use, stress, and low caloric intake as she chronically consumes 1 main meal per day. Colonoscopy up-to-date in March 2021, due for surveillance in 2026.  Recent EGD in April 2022. MRI/MRCP December 2020 with mild retroperitoneal and mesenteric lymphadenopathy in the abdomen.  Follow-up CT A/P with contrast December 2020 with persistent retroperitoneal adenopathy of unknown clinical significance measuring up to approximately 1 cm, not amendable to biopsy due to small size.  Also with an area of focal thickening involving the anterior bladder wall which was followed by cystoscopy with urology that was normal. While lymphadenopathy on prior imaging was of unknown clinical significance, in light of significant unintentional weight loss over the last couple of years, I feel it is reasonable to repeat a CT scan to follow-up on this.  Notably, we had discussed this at her last visit and she preferred to hold off at that time, but is requesting to pursue CT now. Otherwise, no additional evaluation is needed at this time. We discussed the importance of eating to live extensively today.   GERD: Chronic.  Has been well controlled on Prilosec 20 mg daily for quite some time, but now with breakthrough symptoms about 3-4 days a week.  This may be influenced by dietary habits including significant coffee consumption, consuming high-fat  foods, and eating most of her daily food consumption in the evening. May also be losing med effect.  No alarm symptoms.  We will try changing Prilosec to Protonix. GERD diet reinforced.  Constipation:  Chronic.  Currently controlled with Colace 200 mg daily which works fairly well, but still skips a couple of days between bowel movements at times.  No alarm symptoms.  Colonoscopy up-to-date.  Recommended adding Benefiber daily and advised to call with ongoing symptoms.  Elevated LFTs: Extensive evaluation as detailed in HPI, found to have positive AMA, but LFTs normalized in June 2021 with reducing alcohol intake and weight loss (unintentional as per above) and have remained within normal limits thus far, most recently in September 2022.  We did repeat AMA in September 2022 which remained elevated, but down to 146 from 173, 2 years ago.  Overall, previously elevated LFTs thought to be secondary to chronic alcohol use.  She has been abstinent since January 2022.  In the presence of positive AMA, cannot rule out underlying PBC; but no need to treat as LFTs have remained within normal limits. Recommend following LFTs every 6 months. Case was discussed with Dr. Gala Romney previously.    Plan:  CT A/P with contrast to follow-up on prior retroperitoneal lymphadenopathy in the setting of weight loss. Discussed the importance of eating to live: Recommended she eat at least a small breakfast and lunch in addition to her dinner. Encouraged she drink 1-2 protein shakes daily especially if not eating breakfast or lunch.  May purchase over-the-counter protein powder to make her own shakes to help with cost. Encouraged eating a well-balanced diet with foods that are nutrient dense rather than focusing on consuming foods that are high in calories only. Stop Prilosec and start Protonix  20 mg daily.  New prescription sent to pharmacy. Reinforced GERD diet/lifestyle.  Separate written instructions provided. Continue  Colace 200 mg daily. Start Benefiber 2 teaspoons daily x2 weeks then increase to twice daily. Advised to call if she has ongoing constipation. Repeat HFP in March. Continue to avoid alcohol. Avoid over-the-counter supplements. No more than 2000 mg of Tylenol per day if needed. Follow-up in 3 months on GERD and weight loss.    Aliene Altes, PA-C [facility] 12/10/2021

## 2021-12-10 ENCOUNTER — Other Ambulatory Visit: Payer: Self-pay

## 2021-12-10 ENCOUNTER — Encounter: Payer: Self-pay | Admitting: Gastroenterology

## 2021-12-10 ENCOUNTER — Ambulatory Visit: Payer: Medicare Other | Admitting: Gastroenterology

## 2021-12-10 VITALS — BP 121/80 | HR 76 | Temp 97.9°F | Ht 65.0 in | Wt 126.0 lb

## 2021-12-10 DIAGNOSIS — R634 Abnormal weight loss: Secondary | ICD-10-CM

## 2021-12-10 DIAGNOSIS — R7989 Other specified abnormal findings of blood chemistry: Secondary | ICD-10-CM | POA: Diagnosis not present

## 2021-12-10 DIAGNOSIS — K219 Gastro-esophageal reflux disease without esophagitis: Secondary | ICD-10-CM

## 2021-12-10 DIAGNOSIS — R590 Localized enlarged lymph nodes: Secondary | ICD-10-CM

## 2021-12-10 DIAGNOSIS — K5900 Constipation, unspecified: Secondary | ICD-10-CM

## 2021-12-10 MED ORDER — PANTOPRAZOLE SODIUM 20 MG PO TBEC
20.0000 mg | DELAYED_RELEASE_TABLET | Freq: Every day | ORAL | 3 refills | Status: DC
Start: 2021-12-10 — End: 2022-04-05

## 2021-12-10 NOTE — Patient Instructions (Signed)
We will arrange for you to have a CT scan of your abdomen and pelvis in the near future to follow-up on prior lymphadenopathy (slightly enlarged lymph nodes).   For your reflux: Some of your symptoms may be secondary to your dietary habits including eating your largest meal in the evening and consuming fattier foods. We will stop omeprazole and start you on pantoprazole 20 mg daily.  I have sent a prescription to your pharmacy.  Follow a GERD diet:  Avoid fried, fatty, greasy, spicy, citrus foods. Avoid caffeine and carbonated beverages. Avoid chocolate. Try eating 4-6 small meals a day rather than 3 large meals. Do not eat within 3 hours of laying down. Prop head of bed up on wood or bricks to create a 6 inch incline.   To help maintain/gain weight:  Focus on eating to live. I recommend you start eating at least a small breakfast and small lunch in addition to your dinner. Drink 1-2 in protein shakes daily.  You can purchase over-the-counter protein powder and make your own shakes to help with cost. It is important that you get nutrient dense foods and not only foods that are high in calories.  For elevated liver enzymes: Your liver enzymes have been normal since June 2021.   We will plan to repeat your labs in March.   Continue to avoid alcohol, over-the-counter supplements. No more than 2000 mg of Tylenol per day.  For mild constipation: Continue Colace 200 mg daily. Start Benefiber 2 teaspoons daily x2 weeks then increase to twice daily. Call if ongoing symptoms.  We will plan to follow-up with you in 3 months.  Do not hesitate to call if you have questions or concerns prior to your next visit.  It was great to see you again today!  Happy new year!  Aliene Altes, PA-C [facility]

## 2021-12-23 ENCOUNTER — Encounter: Payer: Self-pay | Admitting: Dermatology

## 2021-12-23 ENCOUNTER — Other Ambulatory Visit: Payer: Self-pay

## 2021-12-23 ENCOUNTER — Ambulatory Visit: Payer: Medicare Other | Admitting: Dermatology

## 2021-12-23 DIAGNOSIS — L570 Actinic keratosis: Secondary | ICD-10-CM

## 2021-12-23 MED ORDER — IMIQUIMOD 5 % EX CREA: TOPICAL_CREAM | CUTANEOUS | 0 refills | Status: DC

## 2021-12-23 NOTE — Patient Instructions (Signed)
Apply Cervae over the counter dry skin.  Use aldara cream for 6 weeks Monday, Wednesday, and Friday for total of 18 applications

## 2022-01-13 ENCOUNTER — Other Ambulatory Visit (HOSPITAL_COMMUNITY)
Admission: RE | Admit: 2022-01-13 | Discharge: 2022-01-13 | Disposition: A | Discharge status: Home / Self Care | Payer: Medicare Other | Source: Ambulatory Visit | Attending: Gastroenterology | Admitting: Gastroenterology

## 2022-01-13 ENCOUNTER — Other Ambulatory Visit: Payer: Self-pay

## 2022-01-13 DIAGNOSIS — R634 Abnormal weight loss: Secondary | ICD-10-CM | POA: Diagnosis present

## 2022-01-13 DIAGNOSIS — R590 Localized enlarged lymph nodes: Secondary | ICD-10-CM | POA: Diagnosis present

## 2022-01-13 LAB — CREATININE, SERUM
Creatinine, Ser: 0.62 mg/dL (ref 0.44–1.00)
GFR, Estimated: >60 mL/min (ref >60)

## 2022-01-15 ENCOUNTER — Other Ambulatory Visit: Payer: Self-pay

## 2022-01-15 ENCOUNTER — Encounter: Payer: Self-pay | Admitting: Dermatology

## 2022-01-15 ENCOUNTER — Ambulatory Visit (HOSPITAL_COMMUNITY)
Admission: RE | Admit: 2022-01-15 | Discharge: 2022-01-15 | Disposition: A | Discharge status: Home / Self Care | Payer: Medicare Other | Source: Ambulatory Visit | Attending: Gastroenterology | Admitting: Gastroenterology

## 2022-01-15 DIAGNOSIS — R634 Abnormal weight loss: Secondary | ICD-10-CM | POA: Insufficient documentation

## 2022-01-15 DIAGNOSIS — R590 Localized enlarged lymph nodes: Secondary | ICD-10-CM | POA: Diagnosis present

## 2022-01-15 MED ORDER — IOHEXOL 300 MG/ML  SOLN
100.0000 mL | Freq: Once | INTRAMUSCULAR | Status: AC | PRN
Start: 2022-01-15 — End: 2022-01-15
  Administered 2022-01-15: 100 mL via INTRAVENOUS

## 2022-01-15 NOTE — Progress Notes (Signed)
° °  Follow-Up Visit   Subjective  Nicole Ewing is a 61 y.o. female who presents for the following: Follow-up (Mild reaction for the pdt, right brow right cheek and left nose and tip some scale ).  Actinic keratoses treated with PDT Location:  Duration:  Quality:  Associated Signs/Symptoms: Modifying Factors:  Severity:  Timing: Context:   Objective  Well appearing patient in no apparent distress; mood and affect are within normal limits. Head Overall perhaps 50% reduction in small actinic keratoses but a handful of persistent pink crusts.    A focused examination was performed including head and neck. Relevant physical exam findings are noted in the Assessment and Plan.   Assessment & Plan    AK (actinic keratosis) Head  Imiquimod use for 6 weeks Monday, Wednesday, and Friday for total of 18 applications.  If there is significant inflammation in less than 18 applications she may discontinue the therapy.  Follow-up 1 month later with status update.  Related Medications imiquimod (ALDARA) 5 % cream Use for 6 weeks Monday, Wednesday, and Friday for total of 18 applications      I, [name], MD, have reviewed all documentation for this visit.  The documentation on 01/15/22 for the exam, diagnosis, procedures, and orders are all accurate and complete.

## 2022-02-05 ENCOUNTER — Other Ambulatory Visit: Payer: Self-pay

## 2022-02-05 ENCOUNTER — Other Ambulatory Visit (HOSPITAL_COMMUNITY)
Admission: RE | Admit: 2022-02-05 | Discharge: 2022-02-05 | Disposition: A | Discharge status: Home / Self Care | Payer: Medicare Other | Source: Ambulatory Visit | Attending: Gastroenterology | Admitting: Gastroenterology

## 2022-02-05 DIAGNOSIS — R7989 Other specified abnormal findings of blood chemistry: Secondary | ICD-10-CM | POA: Diagnosis present

## 2022-02-05 DIAGNOSIS — R634 Abnormal weight loss: Secondary | ICD-10-CM | POA: Insufficient documentation

## 2022-02-05 DIAGNOSIS — K219 Gastro-esophageal reflux disease without esophagitis: Secondary | ICD-10-CM | POA: Insufficient documentation

## 2022-02-05 LAB — HEPATIC FUNCTION PANEL
ALT: 17 U/L (ref 0–44)
AST: 25 U/L (ref 15–41)
Albumin: 4 g/dL (ref 3.5–5.0)
Alkaline Phosphatase: 48 U/L (ref 38–126)
Bilirubin, Direct: <0.1 mg/dL (ref 0.0–0.2)
Total Bilirubin: 0.1 mg/dL — ABNORMAL LOW (ref 0.3–1.2)
Total Protein: 7.1 g/dL (ref 6.5–8.1)

## 2022-02-08 ENCOUNTER — Other Ambulatory Visit: Payer: Self-pay | Admitting: *Deleted

## 2022-02-08 DIAGNOSIS — R7989 Other specified abnormal findings of blood chemistry: Secondary | ICD-10-CM

## 2022-02-24 ENCOUNTER — Other Ambulatory Visit (HOSPITAL_COMMUNITY)
Admission: RE | Admit: 2022-02-24 | Discharge: 2022-02-24 | Disposition: A | Discharge status: Home / Self Care | Payer: Medicare Other | Source: Ambulatory Visit | Attending: "Endocrinology | Admitting: "Endocrinology

## 2022-02-24 DIAGNOSIS — E0520 Thyrotoxicosis with toxic multinodular goiter without thyrotoxic crisis or storm: Secondary | ICD-10-CM | POA: Diagnosis present

## 2022-02-24 DIAGNOSIS — E871 Hypo-osmolality and hyponatremia: Secondary | ICD-10-CM | POA: Insufficient documentation

## 2022-02-24 LAB — COMPREHENSIVE METABOLIC PANEL
ALT: 17 U/L (ref 0–44)
AST: 22 U/L (ref 15–41)
Albumin: 4.1 g/dL (ref 3.5–5.0)
Alkaline Phosphatase: 47 U/L (ref 38–126)
Anion gap: 10 (ref 5–15)
BUN: 7 mg/dL — ABNORMAL LOW (ref 8–23)
CO2: 24 mmol/L (ref 22–32)
Calcium: 9.5 mg/dL (ref 8.9–10.3)
Chloride: 106 mmol/L (ref 98–111)
Creatinine, Ser: 0.69 mg/dL (ref 0.44–1.00)
GFR, Estimated: >60 mL/min (ref >60)
Glucose, Bld: 102 mg/dL — ABNORMAL HIGH (ref 70–99)
Potassium: 4 mmol/L (ref 3.5–5.1)
Sodium: 140 mmol/L (ref 135–145)
Total Bilirubin: 0.1 mg/dL — ABNORMAL LOW (ref 0.3–1.2)
Total Protein: 7.3 g/dL (ref 6.5–8.1)

## 2022-02-24 LAB — TSH: TSH: 0.597 u[IU]/mL (ref 0.350–4.500)

## 2022-02-24 LAB — T4, FREE: Free T4: 0.88 ng/dL (ref 0.61–1.12)

## 2022-02-25 LAB — T3, FREE: T3, Free: 2.8 pg/mL (ref 2.0–4.4)

## 2022-03-03 ENCOUNTER — Ambulatory Visit: Payer: Medicare Other | Admitting: "Endocrinology

## 2022-03-03 ENCOUNTER — Encounter: Payer: Self-pay | Admitting: "Endocrinology

## 2022-03-03 VITALS — BP 136/78 | HR 88 | Ht 65.0 in | Wt 127.8 lb

## 2022-03-03 DIAGNOSIS — E871 Hypo-osmolality and hyponatremia: Secondary | ICD-10-CM | POA: Diagnosis not present

## 2022-03-03 DIAGNOSIS — E0520 Thyrotoxicosis with toxic multinodular goiter without thyrotoxic crisis or storm: Secondary | ICD-10-CM | POA: Diagnosis not present

## 2022-03-03 NOTE — Progress Notes (Signed)
? ?     ?                                           03/03/2022, 3:55 PM ?    ?                   ?Endocrinology follow-up note ? ?Subjective:  ? ? Patient ID: Nicole Ewing, female    DOB: March 08, 1961, PCP Milaca Nation, MD ? ? ?Past Medical History:  ?Diagnosis Date  ? Acid reflux   ? Anxiety   ? Arthritis   ? Cervical cancer ([place])   ? COPD (chronic obstructive pulmonary disease) ([place])   ? Depression   ? H/O: hysterectomy   ? Cervical dysplasia  ? High cholesterol   ? Hyperthyroidism   ? Menopause ovarian failure   ? Migraines   ? Osteoporosis   ? ?Past Surgical History:  ?Procedure Laterality Date  ? ABDOMINAL HYSTERECTOMY    ? BIOPSY  03/19/2021  ? Procedure: BIOPSY;  Surgeon: Daneil Dolin, MD;  [facility];  Service: Endoscopy;;  ? BREAST SURGERY Right   ? breast buiopsy  ? COLONOSCOPY  04/06/2007  ? [facility]:  Normal rectum, no inflammation, no colonic or rectal polyps, tortuous and redundant colon. Recommendations to repeat TCS in 5 years (2013).   ? COLONOSCOPY WITH PROPOFOL N/A 02/07/2020  ? Procedure: COLONOSCOPY WITH PROPOFOL;  Surgeon: Daneil Dolin, MD;  1 hyperplastic polyp. Otherwise normal exam. Recommended repeat in 5 years.   ? ESOPHAGOGASTRODUODENOSCOPY (EGD) WITH PROPOFOL N/A 03/19/2021  ? Surgeon: Daneil Dolin, MD;  Normal esophagus s/p dilation and biopsy, patchy erythema of gastric mucosa particularly the body with a polypoid appearance s/p biopsy, normal examined duodenum.  Pathology with chronic mild gastritis, negative for H. pylori.  Esophageal biopsy benign.  ? MALONEY DILATION N/A 03/19/2021  ? Procedure: MALONEY DILATION;  Surgeon: Daneil Dolin, MD;  [facility];  Service: Endoscopy;  Laterality: N/A;  ? POLYPECTOMY  02/07/2020  ? Procedure: POLYPECTOMY;  Surgeon: Daneil Dolin, MD;  [facility];  Service: Endoscopy;;  ? TONSILLECTOMY    ? TOTAL HIP ARTHROPLASTY Left 11/16/2018  ? TOTAL HIP ARTHROPLASTY Right 07/02/2019   ? ?Social History  ? ?Socioeconomic History  ? Marital status: Married  ?  Spouse name: Not on file  ? Number of children: Not on file  ? Years of education: Not on file  ? Highest education level: Not on file  ?Occupational History  ? Not on file  ?Tobacco Use  ? Smoking status: Every Day  ?  Packs/day: 0.25  ?  Years: 25.00  ?  Pack years: 6.25  ?  Types: Cigars, Cigarettes  ? Smokeless tobacco: Former  ? Tobacco comments:  ?  1-2 cigars daily  ?Vaping Use  ? Vaping Use: Never used  ?Substance and Sexual Activity  ? Alcohol use: Not Currently  ?  Comment: 4-6 beers daily (decresed to 2 every other day -08/15/2020); quit January 2022  ? Drug use: Not Currently  ? Sexual activity: Yes  ?  Birth control/protection: Surgical  ?Other Topics Concern  ? Not on file  ?Social History Narrative  ? Not on file  ? ?Social Determinants of Health  ? ?Financial Resource Strain: Not on file  ?Food Insecurity: Not on file  ?Transportation  Needs: Not on file  ?Physical Activity: Not on file  ?Stress: Not on file  ?Social Connections: Not on file  ? ?Family History  ?Problem Relation Age of Onset  ? Melanoma Father   ? Graves' disease Child   ? Colon polyps Brother   ? Colon cancer Neg Hx   ? ?Outpatient Encounter Medications as of 03/03/2022  ?Medication Sig  ? Blood Glucose Monitoring Suppl (ACCU-CHEK GUIDE ME) w/Device KIT 1 Piece by Does not apply route as directed.  ? Calcium Carbonate-Vitamin D (CALCIUM 600+D PO) 1261m calcium, 10034mvit D  ? diazepam (VALIUM) 5 MG tablet Take 5 mg by mouth in the morning and at bedtime.  ? docusate sodium (COLACE) 100 MG capsule Take 200 mg by mouth daily.  ? glucose blood (ACCU-CHEK GUIDE) test strip USE ONLY WHEN YOU GET SYMPTOMS OF LOW GLUCOSE  ? HYDROcodone-acetaminophen (NORCO/VICODIN) 5-325 MG tablet Take 1 tablet by mouth every 8 (eight) hours as needed for severe pain.  ? imiquimod (ALDARA) 5 % cream Use for 6 weeks Monday, Wednesday, and Friday for total of 18 applications (Patient  not taking: Reported on 03/03/2022)  ? Lancets MISC 1 each by Does not apply route as directed.  ? loratadine (CLARITIN) 10 MG tablet Take 10 mg by mouth daily.  ? methocarbamol (ROBAXIN) 750 MG tablet Take 750 mg by mouth 4 (four) times daily as needed for muscle spasms.  ? Multiple Vitamin (MULTIVITAMIN WITH MINERALS) TABS tablet Take 1 tablet by mouth daily.  ? naproxen sodium (ALEVE) 220 MG tablet Take 220 mg by mouth 2 (two) times daily as needed (pain).  ? pantoprazole (PROTONIX) 20 MG tablet Take 1 tablet (20 mg total) by mouth daily before breakfast.  ? Probiotic Product (PROBIOTIC DAILY PO) Take 1 capsule by mouth daily.  ? vitamin B-12 (CYANOCOBALAMIN) 250 MCG tablet Take 250 mcg by mouth daily.  ? Zoledronic Acid (RECLAST IV) Inject into the vein. Once a year. Last had 10/07/21  ? ?No facility-administered encounter medications on file as of 03/03/2022.  ? ?ALLERGIES: ?Allergies  ?Allergen Reactions  ? Tramadol   ?  Sick on stomach   ? Codeine Rash  ? Keflex [Cephalexin] Rash  ? ? ?VACCINATION STATUS: ? ?There is no immunization history on file for this patient. ? ?HPI ?Nicole Ewing [age].o. female who is returning with repeat thyroid function test after she was seen in consultation for hyperthyroidism.   ? ?PMD: WiRaleigh NationMD. ?She was diagnosed with hyperthyroidism in May 2020 when she was previously briefly treated with methimazole with adequate treatment response.  She is currently not on any antithyroid intervention.  Her previsit thyroid function tests are consistent with euthyroid state.   ? ? She has no new complaints today.  She reports that she quit alcohol consumption since last visit.   ?Previsit labs show hyponatremia.  She is known to have chronic/subacute hyponatremia. ?Prior to her last visit: ?-Her thyroid uptake and scan on October 23, 2019 showed 25.6% 24-hour uptake.  The thyroid  Scan was reported to be inhomogeneous with relative increased tracer accumulation at  right  superior and left inferior lobes and relative decreased uptake at the right inferior pole suggesting presence of at least one thyroid nodule and ultrasound of the thyroid confirms same.    She underwent fine-needle biopsy of this nodule with benign findings.   ?Currently, she denies palpitations, heat intolerance, tremors.  She does have family history of thyroid dysfunction in her  daughter.  ?She remains active smoker. ? ?She denies dysphagia, shortness of breath, nor voice change. ?She admits to upto 6 beers daily. ? ?Review of Systems ?Limited as above. ? ?Objective:  ?  ?BP 136/78   Pulse 88   Ht '5\' 5"'  (1.651 m)   Wt 127 lb 12.8 oz (58 kg)   BMI 21.27 kg/m?   ?Wt Readings from Last 3 Encounters:  ?03/03/22 127 lb 12.8 oz (58 kg)  ?12/10/21 126 lb (57.2 kg)  ?09/02/21 122 lb 3.2 oz (55.4 kg)  ?  ?Physical Exam ? ?CMP ( most recent) ?CMP  ?   ?Component Value Date/Time  ? NA 140 02/24/2022 1121  ? K 4.0 02/24/2022 1121  ? CL 106 02/24/2022 1121  ? CO2 24 02/24/2022 1121  ? GLUCOSE 102 (H) 02/24/2022 1121  ? BUN 7 (L) 02/24/2022 1121  ? BUN 7 04/26/2019 0000  ? CREATININE 0.69 02/24/2022 1121  ? CREATININE 0.43 (L) 06/05/2019 1446  ? CALCIUM 9.5 02/24/2022 1121  ? PROT 7.3 02/24/2022 1121  ? ALBUMIN 4.1 02/24/2022 1121  ? AST 22 02/24/2022 1121  ? ALT 17 02/24/2022 1121  ? ALKPHOS 47 02/24/2022 1121  ? BILITOT 0.1 (L) 02/24/2022 1121  ? GFRNONAA >60 02/24/2022 1121  ? GFRNONAA 112 06/05/2019 1446  ? GFRAA >60 12/04/2019 1353  ? GFRAA 130 06/05/2019 1446  ? ? ?Lipid Panel  ?   ?Component Value Date/Time  ? CHOL 188 04/26/2019 0000  ? TRIG 92 04/26/2019 0000  ? HDL 68 04/26/2019 0000  ? [place] 102 04/26/2019 0000  ? ?  ? ?Lab Results  ?Component Value Date  ? TSH 0.597 02/24/2022  ? TSH 0.699 08/26/2021  ? TSH 0.593 02/23/2021  ? TSH 1.110 08/28/2020  ? TSH 0.543 02/25/2020  ? TSH 0.95 09/26/2019  ? TSH 0.01 (A) 04/26/2019  ? TSH 0.01 (A) 04/26/2019  ? FREET4 0.88 02/24/2022  ? FREET4 0.78 08/26/2021  ? FREET4  0.86 02/23/2021  ? FREET4 0.75 08/28/2020  ? FREET4 0.69 02/25/2020  ? FREET4 0.7 (L) 09/26/2019  ?  ? ? ?Assessment & Plan:  ? ?1. Hyperthyroidism-resolved status post methimazole treatment.   ?2. hyponatre

## 2022-03-09 ENCOUNTER — Ambulatory Visit: Payer: Medicare Other | Admitting: Dermatology

## 2022-03-09 ENCOUNTER — Encounter: Payer: Self-pay | Admitting: Dermatology

## 2022-03-09 DIAGNOSIS — L570 Actinic keratosis: Secondary | ICD-10-CM | POA: Diagnosis not present

## 2022-03-29 ENCOUNTER — Encounter: Payer: Self-pay | Admitting: Dermatology

## 2022-03-29 NOTE — Progress Notes (Signed)
? ?  Follow-Up Visit ?  ?Subjective  ?Nicole Ewing is a 61 y.o. female who presents for the following: Follow-up (F/u for aldara. Pt stated 3 weeks, and new bumps pop up on her nose. ). ? ?Actinic keratoses face ?Location:  ?Duration:  ?Quality:  ?Associated Signs/Symptoms: ?Modifying Factors:  ?Severity:  ?Timing: ?Context:  ? ?Objective  ?Well appearing patient in no apparent distress; mood and affect are within normal limits. ?Head - Anterior (Face) ?Spots now smooth to touch with minimal residual erythema. ? ? ? ?A focused examination was performed including head and neck.. Relevant physical exam findings are noted in the Assessment and Plan. ? ? ?Assessment & Plan  ? ? ?AK (actinic keratosis) ?Head - Anterior (Face) ? ?No further intervention indicated. ? ?Related Medications ?imiquimod (ALDARA) 5 % cream ?Use for 6 weeks Monday, Wednesday, and Friday for total of 18 applications ? ? ? ? ? ?I, [name], MD, have reviewed all documentation for this visit.  The documentation on 03/29/22 for the exam, diagnosis, procedures, and orders are all accurate and complete. ?

## 2022-04-04 NOTE — Progress Notes (Signed)
? ? ?Referring Provider: Dupo Nation, MD ?Primary Care Physician:  Pennington Nation, MD ?Primary GI Physician: Dr. Gala Romney ? ?Chief Complaint  ?Patient presents with  ? Follow-up  ? ? ?HPI:   ?Nicole Ewing is a 61 y.o. female presenting today for follow-up of GERD, weight loss, and elevated LFTs. ? ?She has history of GERD, dysphagia resolved with empiric dilation, elevated LFTs, previously with daily alcohol use (abstinent since January 2022) and history of intranasal drug use in her 35s, diarrhea suspected to be secondary to lactose intolerance, constipation, unintentional weight loss.  ? ?Recent EGD April 2022 and colonoscopy up to date March 2021, detailed in surgical history, due for surveillance colonoscopy in 2026. ? ?Extensive evaluation of LFTs previously: ?Evidence of prior hepatitis C with undetectable RNA indicating past/resolved infection.  Hepatitis B serologies negative.  GGT normal.  ASMA negative.  ANA positive.  AMA elevated at 173.  Immunoglobulins wnl.  MRI with no intra or extrahepatic biliary dilation.  Liver unremarkable other than small cyst. AST/ALT normalized in September 2020.  Alk phos normalized in June 2021.  AMA was rechecked in September 2022 and remained elevated, but was down to 146.  Case discussed with Dr. Gala Romney who recommended ongoing monitoring of LFTs every 6 months and continuing to avoid alcohol. ? ? ?Last seen in our office 12/10/2021.  Since 2020, she has lost a total of 24 pounds, but had actually gained 6 pounds back over the last 5 months.  Reported eating ice cream at night to try to gain weight.  No longer drinking protein shakes as they were expensive.  Continue eating 1 main meal per day, dinner, which was normal for her.  Ice cream and popcorn in the evening.  Noted breakthrough GERD 3 to 4 days a week on omeprazole 20 mg daily.  Constipation doing okay with Colace 200 mg daily, but occasionally skip a couple of days.  No alarm symptoms.  Continued to  abstain from alcohol.  Overall, suspected weight loss was multifactorial in the setting of prior, but recent diagnosis of hyperthyroidism s/p treatment with methimazole, alcohol cessation, low caloric intake.  Due to history of retroperitoneal adenopathy noted on MRI and CT, planned to repeat a CT to follow-up on this.  Discussed eating to live.  For GERD, will change Prilosec to Protonix 20 mg daily.  Recommended adding Benefiber to help with constipation.  HFP due in March.  Follow-up in 3 months. ? ?CT A/P with contrast 01/15/2022 with slightly decreased size of the prominent/borderline enlarged retroperitoneal lymph nodes, now measuring up to 9 mm, consistent with benign/indolent process.  Moderate volume of formed stool throughout the colon. ? ?HFP 02/05/2022 within normal limits. ? ? ?Today:  ?Doing well overall.  GERD is well controlled on Protonix 20 mg daily, but states her orthopedic doctor in [place] requested her to stop Protonix due to severe osteoporosis.  Denies abdominal pain, nausea, vomiting, dysphagia. ? ?Constipation fairly well controlled with stool softener daily.  She will use MiraLAX if needed which does work well for her.  Denies BRBPR or melena. ? ?Continues to avoid alcohol.  No abdominal distention, lower extremity edema, yellowing of the eyes or skin, bruising/bleeding, mental status changes. ? ?Wt loss:  ?Wt Readings from Last 8 Encounters:  ?04/05/22 125 lb (56.7 kg)  ?03/03/22 127 lb 12.8 oz (58 kg)  ?12/10/21 126 lb (57.2 kg)  ?09/02/21 122 lb 3.2 oz (55.4 kg)  ?07/22/21 120 lb 3.2 oz (54.5 kg)  ?  03/02/21 130 lb 6.4 oz (59.1 kg)  ?03/02/21 130 lb 12.8 oz (59.3 kg)  ?09/02/20 129 lb (58.5 kg)  ? ?Husband had episode of congestive heart failure recently. His diet has changed and hers has to as she doesn't want to eating something in from of him that he can't.  Limiting fried/fatty foods, sweets,  and sodium.  Still eating 1 main meal a day, dinner.  Drinks coffee and eats sitting down  for breakfast.  No lunch.  Popcorn in the evenings after dinner. ? ? ?Past Medical History:  ?Diagnosis Date  ? Acid reflux   ? Anxiety   ? Arthritis   ? Cervical cancer ([place])   ? COPD (chronic obstructive pulmonary disease) ([place])   ? Depression   ? H/O: hysterectomy   ? Cervical dysplasia  ? High cholesterol   ? Hyperthyroidism   ? Menopause ovarian failure   ? Migraines   ? Osteoporosis   ? ? ?Past Surgical History:  ?Procedure Laterality Date  ? ABDOMINAL HYSTERECTOMY    ? BIOPSY  03/19/2021  ? Procedure: BIOPSY;  Surgeon: Daneil Dolin, MD;  [facility];  Service: Endoscopy;;  ? BREAST SURGERY Right   ? breast buiopsy  ? COLONOSCOPY  04/06/2007  ? [facility]:  Normal rectum, no inflammation, no colonic or rectal polyps, tortuous and redundant colon. Recommendations to repeat TCS in 5 years (2013).   ? COLONOSCOPY WITH PROPOFOL N/A 02/07/2020  ? Procedure: COLONOSCOPY WITH PROPOFOL;  Surgeon: Daneil Dolin, MD;  1 hyperplastic polyp. Otherwise normal exam. Recommended repeat in 5 years.   ? ESOPHAGOGASTRODUODENOSCOPY (EGD) WITH PROPOFOL N/A 03/19/2021  ? Surgeon: Daneil Dolin, MD;  Normal esophagus s/p dilation and biopsy, patchy erythema of gastric mucosa particularly the body with a polypoid appearance s/p biopsy, normal examined duodenum.  Pathology with chronic mild gastritis, negative for H. pylori.  Esophageal biopsy benign.  ? MALONEY DILATION N/A 03/19/2021  ? Procedure: MALONEY DILATION;  Surgeon: Daneil Dolin, MD;  [facility];  Service: Endoscopy;  Laterality: N/A;  ? POLYPECTOMY  02/07/2020  ? Procedure: POLYPECTOMY;  Surgeon: Daneil Dolin, MD;  [facility];  Service: Endoscopy;;  ? TONSILLECTOMY    ? TOTAL HIP ARTHROPLASTY Left 11/16/2018  ? TOTAL HIP ARTHROPLASTY Right 07/02/2019  ? ? ?Current Outpatient Medications  ?Medication Sig Dispense Refill  ? Blood Glucose Monitoring Suppl (ACCU-CHEK GUIDE ME) w/Device KIT 1 Piece by Does not apply  route as directed. 1 kit 0  ? Calcium Carbonate-Vitamin D (CALCIUM 600+D PO) 1223m calcium, 10038mvit D    ? diazepam (VALIUM) 5 MG tablet Take 5 mg by mouth in the morning and at bedtime.    ? docusate sodium (COLACE) 100 MG capsule Take 100 mg by mouth daily.    ? famotidine (PEPCID) 20 MG tablet Take 1 tablet (20 mg total) by mouth daily. 90 tablet 1  ? glucose blood (ACCU-CHEK GUIDE) test strip USE ONLY WHEN YOU GET SYMPTOMS OF LOW GLUCOSE 50 strip 2  ? HYDROcodone-acetaminophen (NORCO/VICODIN) 5-325 MG tablet Take 1 tablet by mouth every 8 (eight) hours as needed for severe pain.    ? Lancets MISC 1 each by Does not apply route as directed. 100 each 3  ? loratadine (CLARITIN) 10 MG tablet Take 10 mg by mouth daily.    ? methocarbamol (ROBAXIN) 750 MG tablet Take 750 mg by mouth 4 (four) times daily as needed for muscle spasms.    ?  Multiple Vitamin (MULTIVITAMIN WITH MINERALS) TABS tablet Take 1 tablet by mouth daily.    ? naproxen sodium (ALEVE) 220 MG tablet Take 220 mg by mouth 2 (two) times daily as needed (pain).    ? Probiotic Product (PROBIOTIC DAILY PO) Take 1 capsule by mouth daily.    ? vitamin B-12 (CYANOCOBALAMIN) 250 MCG tablet Take 250 mcg by mouth daily.    ? Zoledronic Acid (RECLAST IV) Inject into the vein. Once a year. Last had 10/07/21    ? ?No current facility-administered medications for this visit.  ? ? ?Allergies as of 04/05/2022 - Review Complete 04/05/2022  ?Allergen Reaction Noted  ? Tramadol  06/05/2019  ? Codeine Rash 01/27/2017  ? Keflex [cephalexin] Rash 01/27/2017  ? ? ?Family History  ?Problem Relation Age of Onset  ? Melanoma Father   ? Graves' disease Child   ? Colon polyps Brother   ? Colon cancer Neg Hx   ? ? ?Social History  ? ?Socioeconomic History  ? Marital status: Married  ?  Spouse name: Not on file  ? Number of children: Not on file  ? Years of education: Not on file  ? Highest education level: Not on file  ?Occupational History  ? Not on file  ?Tobacco Use  ? Smoking  status: Every Day  ?  Packs/day: 0.25  ?  Years: 25.00  ?  Pack years: 6.25  ?  Types: Cigars, Cigarettes  ? Smokeless tobacco: Former  ? Tobacco comments:  ?  1-2 cigars daily  ?Vaping Use  ? Vaping Use:

## 2022-04-05 ENCOUNTER — Encounter: Payer: Self-pay | Admitting: Gastroenterology

## 2022-04-05 ENCOUNTER — Other Ambulatory Visit: Payer: Self-pay | Admitting: *Deleted

## 2022-04-05 ENCOUNTER — Ambulatory Visit (INDEPENDENT_AMBULATORY_CARE_PROVIDER_SITE_OTHER): Payer: Medicare Other | Admitting: Gastroenterology

## 2022-04-05 VITALS — BP 124/78 | HR 96 | Temp 97.6°F | Ht 64.0 in | Wt 125.0 lb

## 2022-04-05 DIAGNOSIS — K5900 Constipation, unspecified: Secondary | ICD-10-CM | POA: Diagnosis not present

## 2022-04-05 DIAGNOSIS — R7989 Other specified abnormal findings of blood chemistry: Secondary | ICD-10-CM | POA: Diagnosis not present

## 2022-04-05 DIAGNOSIS — R634 Abnormal weight loss: Secondary | ICD-10-CM | POA: Diagnosis not present

## 2022-04-05 DIAGNOSIS — K219 Gastro-esophageal reflux disease without esophagitis: Secondary | ICD-10-CM

## 2022-04-05 MED ORDER — FAMOTIDINE 20 MG PO TABS: 20.0000 mg | ORAL_TABLET | Freq: Every day | ORAL | 1 refills | Status: DC

## 2022-04-05 NOTE — Patient Instructions (Addendum)
Stop Protonix and start Pepcid 20 mg daily.  I have sent a prescription to your pharmacy.  Please let me know if you if you begin to have frequent breakthrough heartburn symptoms with this regimen. ? ?It will be important for you to continue to follow a GERD diet/lifestyle:  ?Avoid fried, fatty, greasy, spicy, citrus foods. ?Avoid caffeine and carbonated beverages. ?Avoid chocolate. ?Try eating 4-6 small meals a day rather than 3 large meals. ?Do not eat within 3 hours of laying down. ?Prop head of bed up on wood or bricks to create a 6 inch incline. ? ? ?Continue taking Colace for constipation.  You may take 2 every day.  ?Continue MiraLAX as needed. You can increase MiraLAX to daily if needed.  ? ?You will be due for labs in September.  We will arrange this for you. ? ?To help maintain your weight: ?Try to eat 3 meals a day.  If you do not feel like eating a meal, try supplementing with a protein shake.  You can purchase protein powder in a container to make your own protein shakes rather than buying the protein shakes themselves as they are expensive. ? ?We will plan to see you back in 6 months.  Do not hesitate to call if you have any questions or concerns prior to your next visit. ? ?It was great to see you again today!  I am glad you are doing well! ? ?Aliene Altes, PA-C ?[facility] ? ?

## 2022-04-16 ENCOUNTER — Telehealth: Payer: Self-pay | Admitting: *Deleted

## 2022-04-16 NOTE — Telephone Encounter (Signed)
Yes, she can try taking 2 Pepcid together each morning.  Or if her morning symptoms are well controlled and she is having breakthrough symptoms in the evening, she can take the second Pepcid in the evening.  If she continues with frequent breakthrough symptoms, she will need to let me know as we will need to resume protonix daily.  ?

## 2022-04-16 NOTE — Telephone Encounter (Signed)
Spoke to pt, she informed me that she is still having acid reflux really bad. She was wondering if she could take 2 Pepcid's instead of 1 a day.  ?

## 2022-04-16 NOTE — Telephone Encounter (Signed)
Spoke to pt, informed her of recommendations. She voiced understanding. Informed to call if she continues to have problems.  ?

## 2022-05-10 ENCOUNTER — Other Ambulatory Visit: Payer: Self-pay | Admitting: Gastroenterology

## 2022-05-10 ENCOUNTER — Telehealth: Payer: Self-pay | Admitting: *Deleted

## 2022-05-10 DIAGNOSIS — K219 Gastro-esophageal reflux disease without esophagitis: Secondary | ICD-10-CM

## 2022-05-10 MED ORDER — FAMOTIDINE 20 MG PO TABS: 20.0000 mg | ORAL_TABLET | Freq: Two times a day (BID) | ORAL | 3 refills | Status: DC

## 2022-05-10 NOTE — Telephone Encounter (Signed)
Noted  

## 2022-05-10 NOTE — Telephone Encounter (Signed)
[facility] called and stated they need a new prescription for Pepcid twice a day

## 2022-05-10 NOTE — Telephone Encounter (Signed)
Rx sent 

## 2022-05-11 DIAGNOSIS — R030 Elevated blood-pressure reading, without diagnosis of hypertension: Secondary | ICD-10-CM | POA: Insufficient documentation

## 2022-05-11 DIAGNOSIS — G894 Chronic pain syndrome: Secondary | ICD-10-CM | POA: Diagnosis not present

## 2022-05-11 DIAGNOSIS — G8929 Other chronic pain: Secondary | ICD-10-CM | POA: Diagnosis not present

## 2022-05-11 DIAGNOSIS — M7062 Trochanteric bursitis, left hip: Secondary | ICD-10-CM | POA: Insufficient documentation

## 2022-05-11 DIAGNOSIS — M545 Low back pain, unspecified: Secondary | ICD-10-CM | POA: Insufficient documentation

## 2022-05-11 DIAGNOSIS — Z79891 Long term (current) use of opiate analgesic: Secondary | ICD-10-CM | POA: Diagnosis not present

## 2022-05-11 DIAGNOSIS — M25562 Pain in left knee: Secondary | ICD-10-CM | POA: Diagnosis not present

## 2022-05-11 DIAGNOSIS — M47816 Spondylosis without myelopathy or radiculopathy, lumbar region: Secondary | ICD-10-CM | POA: Diagnosis not present

## 2022-05-11 DIAGNOSIS — M25561 Pain in right knee: Secondary | ICD-10-CM | POA: Diagnosis not present

## 2022-05-25 DIAGNOSIS — R030 Elevated blood-pressure reading, without diagnosis of hypertension: Secondary | ICD-10-CM | POA: Diagnosis not present

## 2022-05-25 DIAGNOSIS — Z6820 Body mass index (BMI) 20.0-20.9, adult: Secondary | ICD-10-CM | POA: Diagnosis not present

## 2022-05-25 DIAGNOSIS — F17200 Nicotine dependence, unspecified, uncomplicated: Secondary | ICD-10-CM | POA: Diagnosis not present

## 2022-05-25 DIAGNOSIS — R1310 Dysphagia, unspecified: Secondary | ICD-10-CM | POA: Diagnosis not present

## 2022-05-25 DIAGNOSIS — G8929 Other chronic pain: Secondary | ICD-10-CM | POA: Diagnosis not present

## 2022-05-25 DIAGNOSIS — M810 Age-related osteoporosis without current pathological fracture: Secondary | ICD-10-CM | POA: Diagnosis not present

## 2022-05-25 DIAGNOSIS — K760 Fatty (change of) liver, not elsewhere classified: Secondary | ICD-10-CM | POA: Diagnosis not present

## 2022-05-25 DIAGNOSIS — Z23 Encounter for immunization: Secondary | ICD-10-CM | POA: Diagnosis not present

## 2022-07-30 ENCOUNTER — Other Ambulatory Visit: Payer: Self-pay | Admitting: *Deleted

## 2022-07-30 DIAGNOSIS — R7989 Other specified abnormal findings of blood chemistry: Secondary | ICD-10-CM

## 2022-08-09 IMAGING — CT CT ABD-PELV W/ CM
2 of 5 series · 15 of 46 positions shown, 17 images · IV contrast (Omnipaque or Isovue)
Comparison: CT December 06, 2019 and MRI November 16, 2019,
ultrasound March 10, 2021

CLINICAL DATA: Unintentional weight loss, 20 pounds in the last
year, intermittent left-sided abdominal pain. Prior retroperitoneal
adenopathy seen on CT and MRI

EXAM:
CT ABDOMEN AND PELVIS WITH CONTRAST
TECHNIQUE: Multidetector CT imaging of the abdomen and pelvis was performed
using the standard protocol following bolus administration of
intravenous contrast.

[Series 2: axial (person_name) · axial · 0.80mm/px · z∈[+596,+991]mm · 12 of 89 slices shown, 14 images]
[im 5/89  soft-tissue]
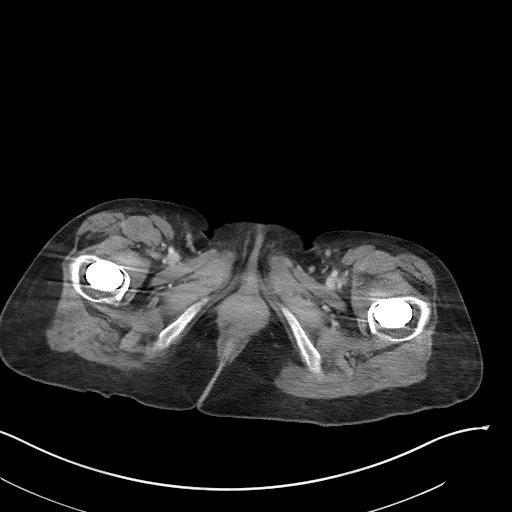
[im 5/89  bone]
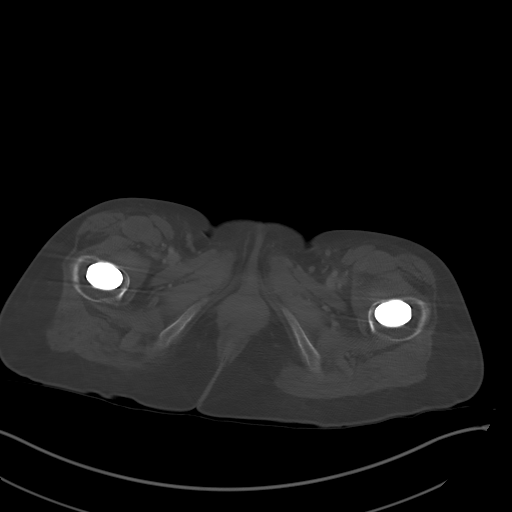
[im 14/89  soft-tissue]
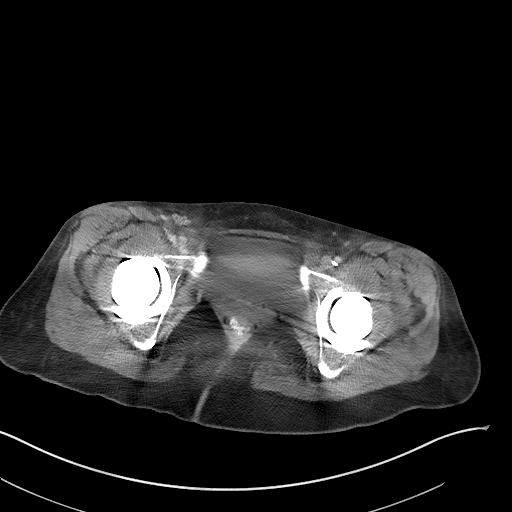
[im 19/89  soft-tissue]
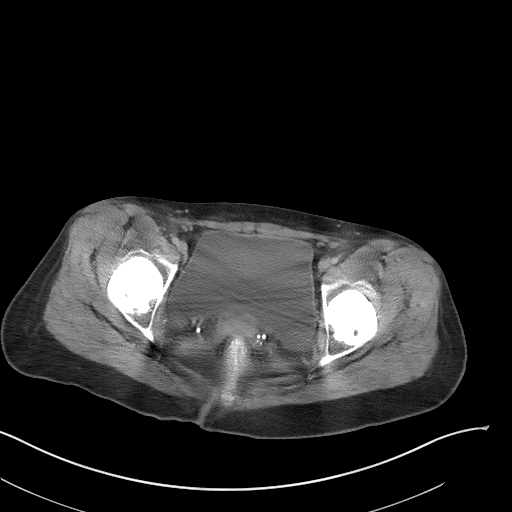
[im 28/89  soft-tissue]
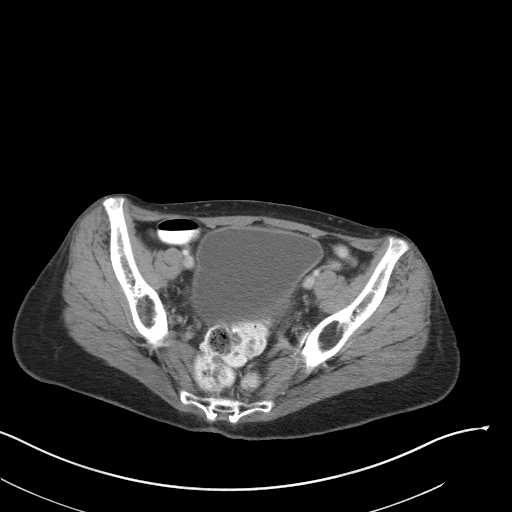
[im 33/89  soft-tissue]
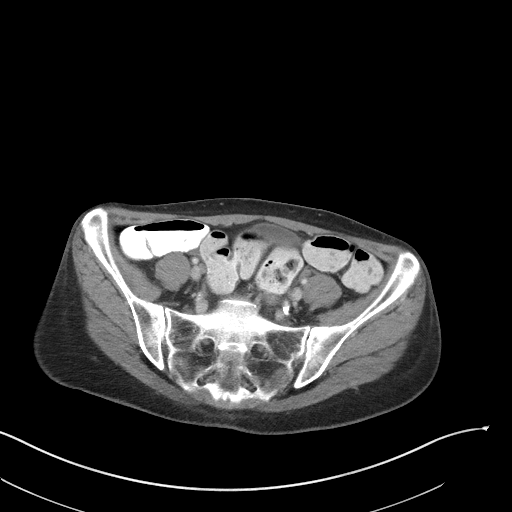
[im 42/89  soft-tissue]
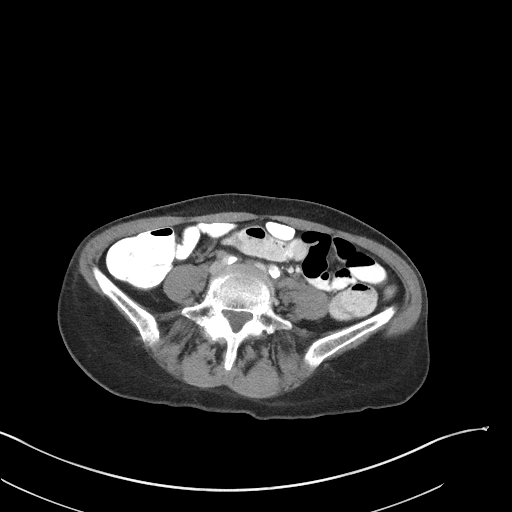
[im 47/89  soft-tissue]
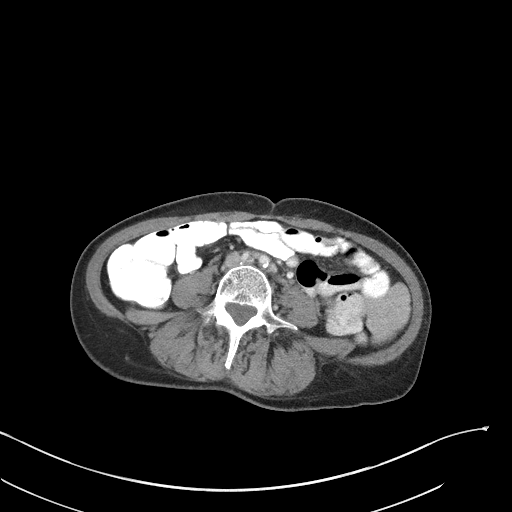
[im 56/89  soft-tissue]
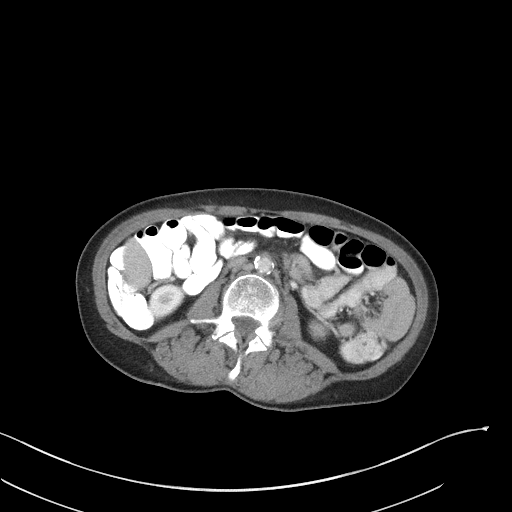
[im 61/89  soft-tissue]
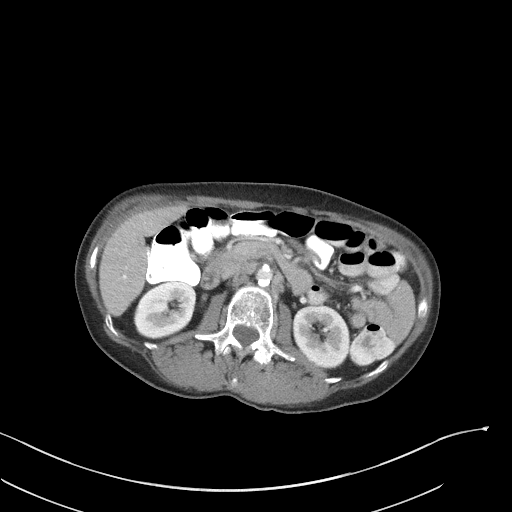
[im 61/89  bone]
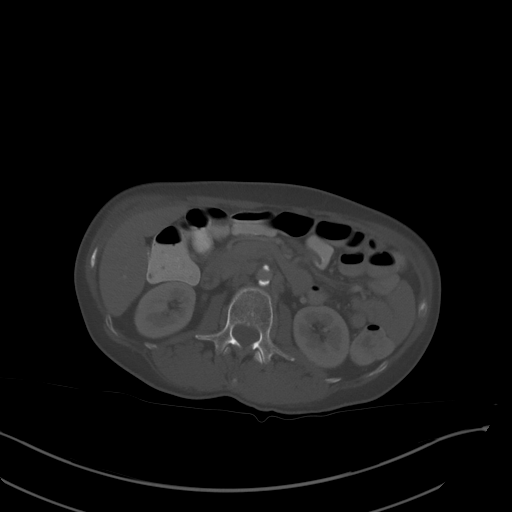
[im 70/89  soft-tissue]
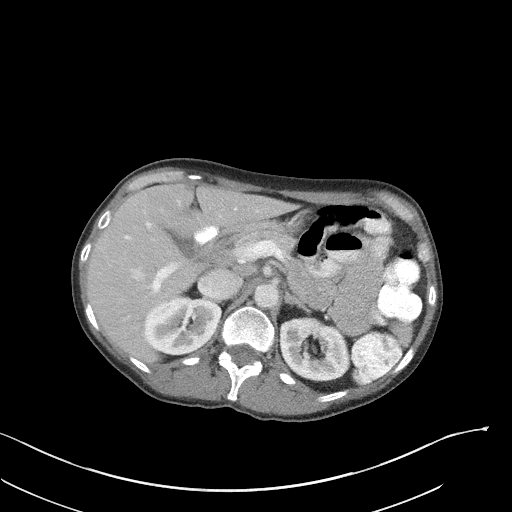
[im 75/89  soft-tissue]
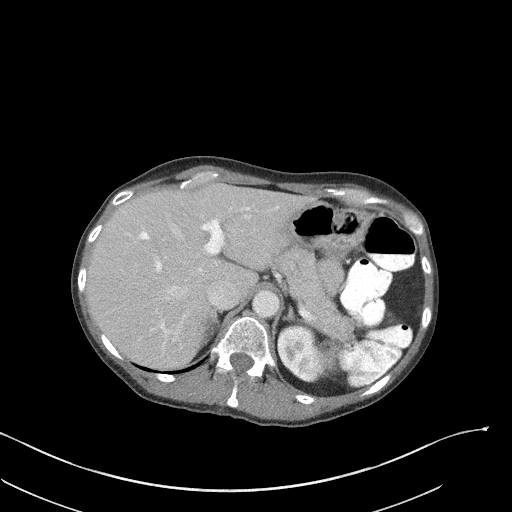
[im 84/89  soft-tissue]
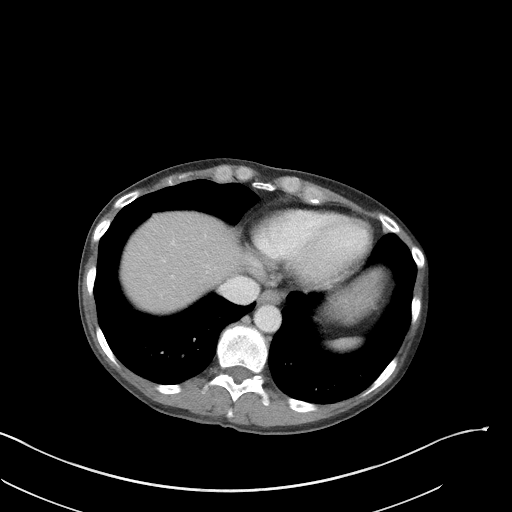

[Series 5: coronal (person_name) · coronal · 0.77mm/px · 3 of 76 slices shown]
[im 26/76  soft-tissue]
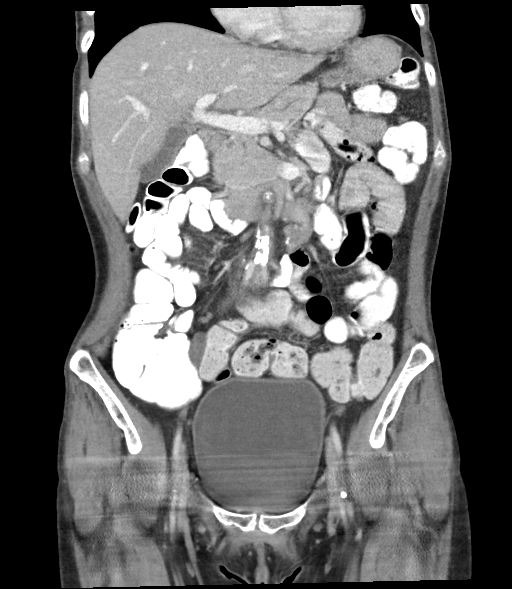
[im 34/76  soft-tissue]
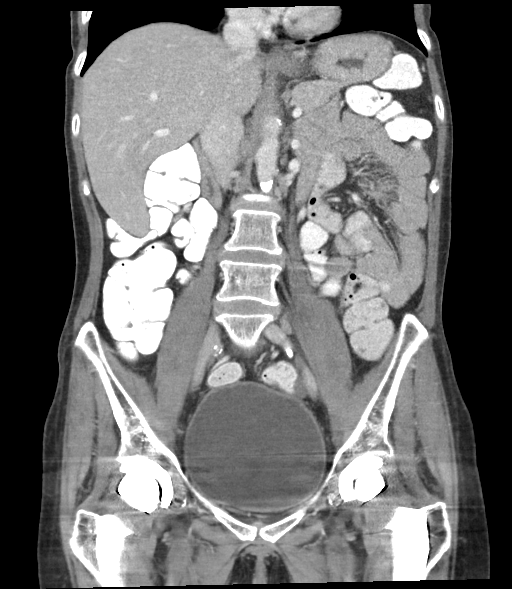
[im 42/76  soft-tissue]
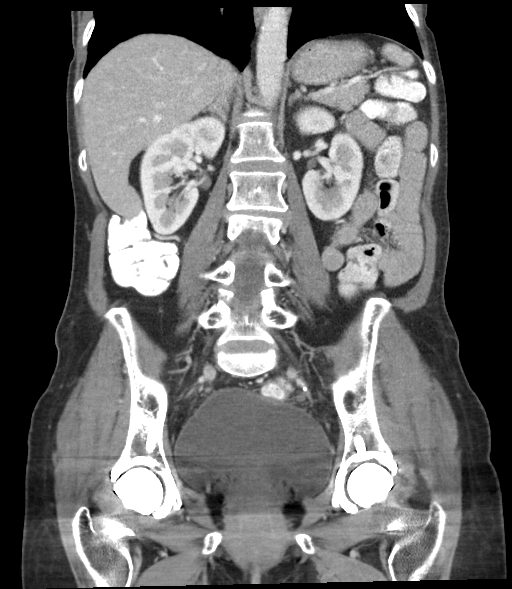

[15 of 46 positions shown; findings below may reference images not displayed]

RADIATION DOSE REDUCTION: This exam was performed according to the
departmental dose-optimization program which includes automated
exposure control, adjustment of the mA and/or kV according to
patient size and/or use of iterative reconstruction technique.

CONTRAST:  100mL OMNIPAQUE IOHEXOL 300 MG/ML  SOLN
FINDINGS: Lower chest: No acute abnormality.

Hepatobiliary: Hypodense 7 mm lesion in the anterior left lobe of
the liver was previously characterized as a cyst on MRI November 16, 2019. No suspicious hepatic lesion. Gallbladder is unremarkable. No
biliary ductal dilation.

Pancreas: No pancreatic ductal dilation or evidence of acute
inflammation.

Spleen: Within normal limits.

Adrenals/Urinary Tract: Bilateral adrenal glands appear normal. No
hydronephrosis. Kidneys demonstrate symmetric enhancement and
excretion of contrast material. No solid enhancing renal mass.
Urinary bladder is unremarkable for degree of distension.

Stomach/Bowel: Radiopaque enteric contrast material traverses the
rectum. Stomach is minimally distended limiting evaluation. No
pathologic dilation of small or large bowel. The terminal ileum
appears normal. The appendix is not confidently identified however
there is no pericecal inflammation. Moderate volume of formed stool
throughout the colon suggestive of constipation.

Vascular/Lymphatic: Aortic atherosclerosis without aneurysmal
dilation. Slightly decreased size of the prominent/borderline
enlarged retroperitoneal lymph nodes now measuring up to 9 mm in
short axis on image [DATE].

Reproductive: Status post hysterectomy. No adnexal masses.

Other: No significant abdominopelvic free fluid. No
pneumoperitoneum.

Musculoskeletal: Bilateral hip arthroplasties. No acute osseous
abnormality.
IMPRESSION: 1. No acute abnormality within the abdomen or pelvis.
2. Slightly decreased size of the prominent/borderline enlarged
retroperitoneal lymph nodes now measuring up to 9 mm in short axis,
are consistent with a benign/indolent process.
3. Moderate volume of formed stool throughout the colon suggestive
of constipation.
4. Aortic Atherosclerosis (Y316M-TZ7.7).

## 2022-08-10 ENCOUNTER — Other Ambulatory Visit (HOSPITAL_COMMUNITY)
Admission: RE | Admit: 2022-08-10 | Discharge: 2022-08-10 | Disposition: A | Discharge status: Home / Self Care | Payer: Medicare Other | Source: Ambulatory Visit | Attending: Gastroenterology | Admitting: Gastroenterology

## 2022-08-10 DIAGNOSIS — R7989 Other specified abnormal findings of blood chemistry: Secondary | ICD-10-CM | POA: Diagnosis not present

## 2022-08-10 LAB — HEPATIC FUNCTION PANEL
ALT: 18 U/L (ref 0–44)
AST: 21 U/L (ref 15–41)
Albumin: 3.9 g/dL (ref 3.5–5.0)
Alkaline Phosphatase: 41 U/L (ref 38–126)
Bilirubin, Direct: <0.1 mg/dL (ref 0.0–0.2)
Total Bilirubin: 0.5 mg/dL (ref 0.3–1.2)
Total Protein: 6.8 g/dL (ref 6.5–8.1)

## 2022-08-17 DIAGNOSIS — R531 Weakness: Secondary | ICD-10-CM | POA: Diagnosis not present

## 2022-08-17 DIAGNOSIS — Z0001 Encounter for general adult medical examination with abnormal findings: Secondary | ICD-10-CM | POA: Diagnosis not present

## 2022-08-17 DIAGNOSIS — I10 Essential (primary) hypertension: Secondary | ICD-10-CM | POA: Diagnosis not present

## 2022-08-17 DIAGNOSIS — R7989 Other specified abnormal findings of blood chemistry: Secondary | ICD-10-CM | POA: Diagnosis not present

## 2022-08-17 DIAGNOSIS — K219 Gastro-esophageal reflux disease without esophagitis: Secondary | ICD-10-CM | POA: Diagnosis not present

## 2022-08-17 DIAGNOSIS — E0590 Thyrotoxicosis, unspecified without thyrotoxic crisis or storm: Secondary | ICD-10-CM | POA: Diagnosis not present

## 2022-08-18 LAB — LIPID PANEL
Cholesterol: 242 — AB (ref 0–200)
HDL: 107 — AB (ref 35–70)
LDL Cholesterol: 123
Triglycerides: 62 (ref 40–160)

## 2022-08-18 LAB — TSH: TSH: 0.21 — AB (ref 0.41–5.90)

## 2022-08-18 LAB — HEPATIC FUNCTION PANEL
ALT: 23 U/L (ref 7–35)
Alkaline Phosphatase: 47 (ref 25–125)
Bilirubin, Total: 0.2

## 2022-08-18 LAB — BASIC METABOLIC PANEL
BUN: 8 (ref 4–21)
CO2: 24 — AB (ref 13–22)
Chloride: 106 (ref 99–108)
Creatinine: 0.7 (ref 0.5–1.1)
Glucose: 109
Potassium: 4.9 mEq/L (ref 3.5–5.1)
Sodium: 139 (ref 137–147)

## 2022-08-18 LAB — VITAMIN D 25 HYDROXY (VIT D DEFICIENCY, FRACTURES): Vit D, 25-Hydroxy: 40.3

## 2022-08-18 LAB — HEMOGLOBIN A1C: Hemoglobin A1C: 6.1

## 2022-08-18 LAB — COMPREHENSIVE METABOLIC PANEL
Albumin: 4.1 (ref 3.5–5.0)
Calcium: 9.4 (ref 8.7–10.7)

## 2022-08-23 ENCOUNTER — Encounter: Payer: Self-pay | Admitting: *Deleted

## 2022-08-30 DIAGNOSIS — K760 Fatty (change of) liver, not elsewhere classified: Secondary | ICD-10-CM | POA: Diagnosis not present

## 2022-08-30 DIAGNOSIS — M810 Age-related osteoporosis without current pathological fracture: Secondary | ICD-10-CM | POA: Diagnosis not present

## 2022-08-30 DIAGNOSIS — Z6821 Body mass index (BMI) 21.0-21.9, adult: Secondary | ICD-10-CM | POA: Diagnosis not present

## 2022-08-30 DIAGNOSIS — F17200 Nicotine dependence, unspecified, uncomplicated: Secondary | ICD-10-CM | POA: Diagnosis not present

## 2022-08-30 DIAGNOSIS — R8290 Unspecified abnormal findings in urine: Secondary | ICD-10-CM | POA: Diagnosis not present

## 2022-08-30 DIAGNOSIS — D649 Anemia, unspecified: Secondary | ICD-10-CM | POA: Diagnosis not present

## 2022-08-30 DIAGNOSIS — G8929 Other chronic pain: Secondary | ICD-10-CM | POA: Diagnosis not present

## 2022-08-30 DIAGNOSIS — Z0001 Encounter for general adult medical examination with abnormal findings: Secondary | ICD-10-CM | POA: Diagnosis not present

## 2022-08-30 DIAGNOSIS — R1310 Dysphagia, unspecified: Secondary | ICD-10-CM | POA: Diagnosis not present

## 2022-09-03 DIAGNOSIS — Z1231 Encounter for screening mammogram for malignant neoplasm of breast: Secondary | ICD-10-CM | POA: Diagnosis not present

## 2022-09-07 ENCOUNTER — Encounter: Payer: Self-pay | Admitting: "Endocrinology

## 2022-09-07 ENCOUNTER — Ambulatory Visit: Payer: Medicare Other | Admitting: "Endocrinology

## 2022-09-07 VITALS — BP 108/70 | HR 84 | Ht 64.0 in | Wt 129.4 lb

## 2022-09-07 DIAGNOSIS — E782 Mixed hyperlipidemia: Secondary | ICD-10-CM

## 2022-09-07 DIAGNOSIS — E0520 Thyrotoxicosis with toxic multinodular goiter without thyrotoxic crisis or storm: Secondary | ICD-10-CM | POA: Diagnosis not present

## 2022-09-07 DIAGNOSIS — E871 Hypo-osmolality and hyponatremia: Secondary | ICD-10-CM | POA: Diagnosis not present

## 2022-09-07 NOTE — Progress Notes (Signed)
09/07/2022, 7:01 PM                        Endocrinology follow-up note  Subjective:    Patient ID: BRINDA FOCHT, female    DOB: 11/05/1961, PCP [place] Nation, MD   Past Medical History:  Diagnosis Date   Acid reflux    Anxiety    Arthritis    Cervical cancer ([place])    COPD (chronic obstructive pulmonary disease) ([place])    Depression    H/O: hysterectomy    Cervical dysplasia   High cholesterol    Hyperthyroidism    Menopause ovarian failure    Migraines    Osteoporosis    Past Surgical History:  Procedure Laterality Date   ABDOMINAL HYSTERECTOMY     BIOPSY  03/19/2021   Procedure: BIOPSY;  Surgeon: Daneil Dolin, MD;  [facility];  Service: Endoscopy;;   BREAST SURGERY Right    breast buiopsy   COLONOSCOPY  04/06/2007   [facility]:  Normal rectum, no inflammation, no colonic or rectal polyps, tortuous and redundant colon. Recommendations to repeat TCS in 5 years (2013).    COLONOSCOPY WITH PROPOFOL N/A 02/07/2020   Procedure: COLONOSCOPY WITH PROPOFOL;  Surgeon: Daneil Dolin, MD;  1 hyperplastic polyp. Otherwise normal exam. Recommended repeat in 5 years.    ESOPHAGOGASTRODUODENOSCOPY (EGD) WITH PROPOFOL N/A 03/19/2021   Surgeon: Daneil Dolin, MD;  Normal esophagus s/p dilation and biopsy, patchy erythema of gastric mucosa particularly the body with a polypoid appearance s/p biopsy, normal examined duodenum.  Pathology with chronic mild gastritis, negative for H. pylori.  Esophageal biopsy benign.   MALONEY DILATION N/A 03/19/2021   Procedure: Venia Minks DILATION;  Surgeon: Daneil Dolin, MD;  [facility];  Service: Endoscopy;  Laterality: N/A;   POLYPECTOMY  02/07/2020   Procedure: POLYPECTOMY;  Surgeon: Daneil Dolin, MD;  [facility];  Service: Endoscopy;;   TONSILLECTOMY     TOTAL HIP ARTHROPLASTY Left 11/16/2018   TOTAL HIP ARTHROPLASTY Right 07/02/2019    Social History   Socioeconomic History   Marital status: Married    Spouse name: Not on file   Number of children: Not on file   Years of education: Not on file   Highest education level: Not on file  Occupational History   Not on file  Tobacco Use   Smoking status: Former    Packs/day: 0.25    Years: 25.00    Total pack years: 6.25    Types: Cigars, Cigarettes    Quit date: 05/2022    Years since quitting: 0.3   Smokeless tobacco: Former   Tobacco comments:    1-2 cigars daily  Vaping Use   Vaping Use: Some days  Substance and Sexual Activity   Alcohol use: Not Currently    Comment: 4-6 beers daily (decresed to 2 every other day -08/15/2020); quit January 2022   Drug use: Not Currently   Sexual activity: Yes    Birth control/protection: Surgical  Other Topics Concern   Not on file  Social History Narrative   Not on file   Social Determinants of Health   Financial  Resource Strain: Not on file  Food Insecurity: Not on file  Transportation Needs: Not on file  Physical Activity: Not on file  Stress: Not on file  Social Connections: Not on file   Family History  Problem Relation Age of Onset   Melanoma Father    Berenice Primas' disease Child    Colon polyps Brother    Colon cancer Neg Hx    Outpatient Encounter Medications as of 09/07/2022  Medication Sig   Blood Glucose Monitoring Suppl (ACCU-CHEK GUIDE ME) w/Device KIT 1 Piece by Does not apply route as directed.   Calcium Carbonate-Vitamin D (CALCIUM 600+D PO) 1268m calcium, 10064mvit D   diazepam (VALIUM) 5 MG tablet Take 5 mg by mouth in the morning and at bedtime.   docusate sodium (COLACE) 100 MG capsule Take 100 mg by mouth daily.   famotidine (PEPCID) 20 MG tablet Take 1 tablet (20 mg total) by mouth 2 (two) times daily.   glucose blood (ACCU-CHEK GUIDE) test strip USE ONLY WHEN YOU GET SYMPTOMS OF LOW GLUCOSE   HYDROcodone-acetaminophen (NORCO/VICODIN) 5-325 MG tablet Take 1 tablet by mouth every 8 (eight)  hours as needed for severe pain.   Lancets MISC 1 each by Does not apply route as directed.   loratadine (CLARITIN) 10 MG tablet Take 10 mg by mouth daily.   methocarbamol (ROBAXIN) 750 MG tablet Take 750 mg by mouth 4 (four) times daily as needed for muscle spasms.   Multiple Vitamin (MULTIVITAMIN WITH MINERALS) TABS tablet Take 1 tablet by mouth daily.   naproxen sodium (ALEVE) 220 MG tablet Take 220 mg by mouth 2 (two) times daily as needed (pain).   Probiotic Product (PROBIOTIC DAILY PO) Take 1 capsule by mouth daily.   vitamin B-12 (CYANOCOBALAMIN) 250 MCG tablet Take 250 mcg by mouth daily.   Zoledronic Acid (RECLAST IV) Inject into the vein. Once a year. Last had 10/07/21   No facility-administered encounter medications on file as of 09/07/2022.   ALLERGIES: Allergies  Allergen Reactions   Tramadol     Sick on stomach    Codeine Rash   Keflex [Cephalexin] Rash    VACCINATION STATUS:  There is no immunization history on file for this patient.  HPI PaDOMONIC HISCOXs [age].o. female who is returning with repeat thyroid function test after she was seen in consultation for hyperthyroidism.    PMD: [place] NationMD. She was diagnosed with hyperthyroidism in May 2020 when she was previously briefly treated with methimazole with adequate treatment response.  She is currently not on any antithyroid intervention.  Her previsit thyroid function test consistent with mild subclinical hyperthyroidism versus preclinical secondary hypothyroidism.     She has no new complaints today.  Reportedly, she quit alcohol prior to last visit, now quit smoking since last visit.    She is known to have chronic/subacute hyponatremia. Prior to her last visit: -Her thyroid uptake and scan on October 23, 2019 showed 25.6% 24-hour uptake.  The thyroid  Scan was reported to be inhomogeneous with relative increased tracer accumulation at  right superior and left inferior lobes and relative decreased  uptake at the right inferior pole suggesting presence of at least one thyroid nodule and ultrasound of the thyroid confirms same.    She underwent fine-needle biopsy of this nodule with benign findings.   Currently, she denies palpitations, heat intolerance, tremors.  She does have family history of thyroid dysfunction in her daughter.  She has steady weight.  She  denies dysphagia, shortness of breath, nor voice change. She admits to upto 6 beers daily.  Review of Systems Limited as above.  Objective:    BP 108/70   Pulse 84   Ht '5\' 4"'  (1.626 m)   Wt 129 lb 6.4 oz (58.7 kg)   BMI 22.21 kg/m   Wt Readings from Last 3 Encounters:  09/07/22 129 lb 6.4 oz (58.7 kg)  04/05/22 125 lb (56.7 kg)  03/03/22 127 lb 12.8 oz (58 kg)    Physical Exam  CMP ( most recent) CMP     Component Value Date/Time   NA 139 08/18/2022 0000   K 4.9 08/18/2022 0000   CL 106 08/18/2022 0000   CO2 24 (A) 08/18/2022 0000   GLUCOSE 102 (H) 02/24/2022 1121   BUN 8 08/18/2022 0000   CREATININE 0.7 08/18/2022 0000   CREATININE 0.69 02/24/2022 1121   CREATININE 0.43 (L) 06/05/2019 1446   CALCIUM 9.4 08/18/2022 0000   PROT 6.8 08/10/2022 1349   ALBUMIN 4.1 08/18/2022 0000   AST 21 08/10/2022 1349   ALT 23 08/18/2022 0000   ALKPHOS 47 08/18/2022 0000   BILITOT 0.5 08/10/2022 1349   GFRNONAA >60 02/24/2022 1121   GFRNONAA 112 06/05/2019 1446   GFRAA >60 12/04/2019 1353   GFRAA 130 06/05/2019 1446    Lipid Panel     Component Value Date/Time   CHOL 242 (A) 08/18/2022 0000   TRIG 62 08/18/2022 0000   HDL 107 (A) 08/18/2022 0000   LDLCALC 123 08/18/2022 0000      Lab Results  Component Value Date   TSH 0.21 (A) 08/18/2022   TSH 0.597 02/24/2022   TSH 0.699 08/26/2021   TSH 0.593 02/23/2021   TSH 1.110 08/28/2020   TSH 0.543 02/25/2020   TSH 0.95 09/26/2019   TSH 0.01 (A) 04/26/2019   TSH 0.01 (A) 04/26/2019   FREET4 0.88 02/24/2022   FREET4 0.78 08/26/2021   FREET4 0.86 02/23/2021    FREET4 0.75 08/28/2020   FREET4 0.69 02/25/2020   FREET4 0.7 (L) 09/26/2019      Assessment & Plan:   1. Hyperthyroidism-resolved status post methimazole treatment.   2. Hyponatremia-resolved Her previsit thyroid function tests are consistent with mild subclinical hyperthyroidism versus mild preclinical secondary hypothyroidism.  She will not be doing antithyroid intervention until her labs separate.  She will have repeat labs before her next visit in 6 months. She was previously briefly treated with low-dose methimazole with clinical response.  -Regarding her hyponatremia: This has resolved after she was advised on free water restrictions.    -She has a negative fine-needle aspiration of a thyroid nodule.   She was counseled on smoking cessation last visit, presents with successful quitting. She has high risk for relapse. The patient was counseled on the dangers of tobacco use, and was advised to quit.  Reviewed strategies to maximize success, including removing cigarettes and smoking materials from environment.   -Her recent labs showed correction of her previously documented hyper transaminases.  I did not initiate any new prescriptions today.  - I advised her  to maintain close follow up with Loda Nation, MD for primary care needs.  I spent 21 minutes in the care of the patient today including review of labs from Thyroid Function, CMP, and other relevant labs ; imaging/biopsy records (current and previous including abstractions from other facilities); face-to-face time discussing  her lab results and symptoms, medications doses, her options of short and long term treatment based  on the latest standards of care / guidelines;   and documenting the encounter.  Salomon Mast Quesnel  participated in the discussions, expressed understanding, and voiced agreement with the above plans.  All questions were answered to her satisfaction. she is encouraged to contact clinic should she have any  questions or concerns prior to her return visit.   Follow up plan: Return in about 6 months (around 03/09/2023) for F/U with Pre-visit Labs.   Glade Lloyd, MD [facility] [address] [place], [place] [address] Phone: [phone]  Fax: [phone]     09/07/2022, 7:01 PM  This note was partially dictated with voice recognition software. Similar sounding words can be transcribed inadequately or may not  be corrected upon review.

## 2022-09-10 ENCOUNTER — Telehealth: Payer: Self-pay | Admitting: Gastroenterology

## 2022-09-10 NOTE — Telephone Encounter (Signed)
Received labs from PCP dated 08/18/2022.  CBC: WBC 6.4, hemoglobin 10.6 (L), hematocrit 30.6 (L), MCV 86.9, MCH 30.0, MCHC 34.6, platelets 262. CMP: Sodium 139, potassium 4.9, chloride 106, glucose 109 (H), BUN 8, creatinine 0.68, calcium 9.4, total protein 6.6, albumin 4.1, AST 26, ALT 23, alk phos 47, total bilirubin 0.2 Hemoglobin A1c: 6.1 (H) Vitamin D3: 40.3 Lipid panel: Total cholesterol 242 (H), HDL 107 (H), triglycerides 62, LDL 122.6 (H). TSH: 0.207 (L)  Reviewed recent labs and [facility] dated 08/30/2022 Hemoglobin 10.0 with normocytic indices. Ferritin low normal at 15, iron saturation low at 11%, iron low normal at 42 Folate and B12 within normal limits.   Courtney:  Please let patient know that I received and reviewed recent blood work completed with her primary care provider.  I see that she has anemia with low iron.  This is new.    She has an upcoming appointment with Dr. Gala Romney on 10/31. We will see about getting this appointment moved up.   Manuela Schwartz: Please see if we can move patient's appointment with Dr. Gala Romney up for her to see him the week of 10/16. If not, would be ok to schedule with myself or another available APP in a regular in-person follow-up slot.

## 2022-09-13 NOTE — Telephone Encounter (Signed)
Noted  

## 2022-09-13 NOTE — Telephone Encounter (Signed)
Pt aware of appointment changes and to come 10/19 at 0900 and cancel 10/31

## 2022-09-13 NOTE — Telephone Encounter (Signed)
Spoke to pt, informed her of results. She voiced understanding.

## 2022-09-15 DIAGNOSIS — D500 Iron deficiency anemia secondary to blood loss (chronic): Secondary | ICD-10-CM | POA: Insufficient documentation

## 2022-09-15 DIAGNOSIS — R1013 Epigastric pain: Secondary | ICD-10-CM | POA: Diagnosis not present

## 2022-09-15 DIAGNOSIS — D638 Anemia in other chronic diseases classified elsewhere: Secondary | ICD-10-CM | POA: Insufficient documentation

## 2022-09-15 DIAGNOSIS — R5383 Other fatigue: Secondary | ICD-10-CM | POA: Diagnosis not present

## 2022-09-15 DIAGNOSIS — Z87891 Personal history of nicotine dependence: Secondary | ICD-10-CM | POA: Diagnosis not present

## 2022-09-15 DIAGNOSIS — D649 Anemia, unspecified: Secondary | ICD-10-CM | POA: Diagnosis not present

## 2022-09-15 DIAGNOSIS — D509 Iron deficiency anemia, unspecified: Secondary | ICD-10-CM | POA: Insufficient documentation

## 2022-09-20 DIAGNOSIS — M25561 Pain in right knee: Secondary | ICD-10-CM | POA: Diagnosis not present

## 2022-09-20 DIAGNOSIS — M25562 Pain in left knee: Secondary | ICD-10-CM | POA: Diagnosis not present

## 2022-09-20 DIAGNOSIS — M7918 Myalgia, other site: Secondary | ICD-10-CM | POA: Diagnosis not present

## 2022-09-20 DIAGNOSIS — G8929 Other chronic pain: Secondary | ICD-10-CM | POA: Diagnosis not present

## 2022-09-20 DIAGNOSIS — G894 Chronic pain syndrome: Secondary | ICD-10-CM | POA: Diagnosis not present

## 2022-09-20 DIAGNOSIS — M47816 Spondylosis without myelopathy or radiculopathy, lumbar region: Secondary | ICD-10-CM | POA: Diagnosis not present

## 2022-09-21 NOTE — Progress Notes (Unsigned)
Referring Provider: [place] Nation, MD Primary Care Physician:   Nation, MD Primary GI Physician: Dr. Gala Romney  No chief complaint on file.   HPI:   Nicole Ewing is a 61 y.o. female with history of GERD, dysphagia resolved with empiric dilation, elevated LFTs, previously with daily alcohol use (abstinent since January 2022), history of intranasal drug use in her 62s, constipation, unintentional weight loss between 2020-2022, presenting today for follow-up.  Extensive evaluation of LFTs previously: Evidence of prior hepatitis C with undetectable RNA indicating past/resolved infection.  Hepatitis B serologies negative.  GGT normal.  ASMA negative.  ANA positive.  AMA elevated at 173.  Immunoglobulins wnl.  MRI with no intra or extrahepatic biliary dilation.  Liver unremarkable other than small cyst. AST/ALT normalized in September 2020.  Alk phos normalized in June 2021.  AMA was rechecked in September 2022 and remained elevated, but was down to 146.  Case discussed with Dr. Gala Romney who recommended ongoing monitoring of LFTs every 6 months and continuing to avoid alcohol.  Last seen in our office 04/05/2022.  GERD well controlled on Protonix 20 mg daily.  Reported orthopedic doctor in [place] requested she discontinue Protonix due to severe osteoporosis.  Constipation fairly well controlled on stool softener daily and MiraLAX as needed.  LFTs had remained normal in March.  Weight was stable over the last 6 months.  Plan to stop Protonix and start Pepcid 20 mg daily, continue Colace and MiraLAX, monitor LFTs every 6 months, encourage patient to increase daily caloric intake and supplement with protein shakes.  Follow-up in 6 months.  Patient called 5/12 reporting frequent reflux symptoms and asking if she could increase Pepcid.  Recommended taking Pepcid 20 mg twice daily or 40 mg in the a.m.  Received lab results from PCP dated 08/18/2022.  She had hemoglobin of 10.6 (L). No other  significant abnormalities on CBC or CMP.  TSH was low at 0.207.  Reviewed labs and [facility] tab dated 08/30/2022 with hemoglobin down to 10.0, normocytic indices, ferritin low normal at 15, iron saturation low at 11%, iron low normal at 42.  Folate and B12 within normal limits. Recommended OV for evaluation.   She has established with [facility] hematology/oncology in [place] who has offered IV iron.  Stated they would consider bone marrow biopsy if hemoglobin failed to improve with IV iron.  Today:    NSAIDs:   Colonoscopy March 2021 with 1 hyperplastic polyp, otherwise normal exam.  Recommended 5-year repeat.  EGD April 2022 with normal esophagus s/p dilation and biopsy, patchy erythema of gastric mucosa particularly in the body with a polypoid appearance s/p biopsy, normal examined duodenum.  Pathology with chronic mild gastritis, negative H. pylori.  Esophageal biopsy benign.   Past Medical History:  Diagnosis Date   Acid reflux    Anxiety    Arthritis    Cervical cancer ([facility])    COPD (chronic obstructive pulmonary disease) ([facility])    Depression    H/O: hysterectomy    Cervical dysplasia   High cholesterol    Hyperthyroidism    Menopause ovarian failure    Migraines    Osteoporosis     Past Surgical History:  Procedure Laterality Date   ABDOMINAL HYSTERECTOMY     BIOPSY  03/19/2021   Procedure: BIOPSY;  Surgeon: Daneil Dolin, MD;  [facility];  Service: Endoscopy;;   BREAST SURGERY Right    breast buiopsy   COLONOSCOPY  04/06/2007  [facility]:  Normal rectum, no inflammation, no colonic or rectal polyps, tortuous and redundant colon. Recommendations to repeat TCS in 5 years (2013).    COLONOSCOPY WITH PROPOFOL N/A 02/07/2020   Procedure: COLONOSCOPY WITH PROPOFOL;  Surgeon: Daneil Dolin, MD;  1 hyperplastic polyp. Otherwise normal exam. Recommended repeat in 5 years.    ESOPHAGOGASTRODUODENOSCOPY (EGD) WITH PROPOFOL N/A 03/19/2021   Surgeon: Daneil Dolin,  MD;  Normal esophagus s/p dilation and biopsy, patchy erythema of gastric mucosa particularly the body with a polypoid appearance s/p biopsy, normal examined duodenum.  Pathology with chronic mild gastritis, negative for H. pylori.  Esophageal biopsy benign.   MALONEY DILATION N/A 03/19/2021   Procedure: Venia Minks DILATION;  Surgeon: Daneil Dolin, MD;  [facility];  Service: Endoscopy;  Laterality: N/A;   POLYPECTOMY  02/07/2020   Procedure: POLYPECTOMY;  Surgeon: Daneil Dolin, MD;  [facility];  Service: Endoscopy;;   TONSILLECTOMY     TOTAL HIP ARTHROPLASTY Left 11/16/2018   TOTAL HIP ARTHROPLASTY Right 07/02/2019    Current Outpatient Medications  Medication Sig Dispense Refill   Blood Glucose Monitoring Suppl (ACCU-CHEK GUIDE ME) w/Device KIT 1 Piece by Does not apply route as directed. 1 kit 0   Calcium Carbonate-Vitamin D (CALCIUM 600+D PO) 1226m calcium, 10052mvit D     diazepam (VALIUM) 5 MG tablet Take 5 mg by mouth in the morning and at bedtime.     docusate sodium (COLACE) 100 MG capsule Take 100 mg by mouth daily.     famotidine (PEPCID) 20 MG tablet Take 1 tablet (20 mg total) by mouth 2 (two) times daily. 60 tablet 3   glucose blood (ACCU-CHEK GUIDE) test strip USE ONLY WHEN YOU GET SYMPTOMS OF LOW GLUCOSE 50 strip 2   HYDROcodone-acetaminophen (NORCO/VICODIN) 5-325 MG tablet Take 1 tablet by mouth every 8 (eight) hours as needed for severe pain.     Lancets MISC 1 each by Does not apply route as directed. 100 each 3   loratadine (CLARITIN) 10 MG tablet Take 10 mg by mouth daily.     methocarbamol (ROBAXIN) 750 MG tablet Take 750 mg by mouth 4 (four) times daily as needed for muscle spasms.     Multiple Vitamin (MULTIVITAMIN WITH MINERALS) TABS tablet Take 1 tablet by mouth daily.     naproxen sodium (ALEVE) 220 MG tablet Take 220 mg by mouth 2 (two) times daily as needed (pain).     Probiotic Product (PROBIOTIC DAILY PO) Take 1 capsule by mouth  daily.     vitamin B-12 (CYANOCOBALAMIN) 250 MCG tablet Take 250 mcg by mouth daily.     Zoledronic Acid (RECLAST IV) Inject into the vein. Once a year. Last had 10/07/21     No current facility-administered medications for this visit.    Allergies as of 09/23/2022 - Review Complete 09/07/2022  Allergen Reaction Noted   Tramadol  06/05/2019   Codeine Rash 01/27/2017   Keflex [cephalexin] Rash 01/27/2017    Family History  Problem Relation Age of Onset   Melanoma Father    Graves' disease Child    Colon polyps Brother    Colon cancer Neg Hx     Social History   Socioeconomic History   Marital status: Married    Spouse name: Not on file   Number of children: Not on file   Years of education: Not on file   Highest education level: Not on file  Occupational History  Not on file  Tobacco Use   Smoking status: Former    Packs/day: 0.25    Years: 25.00    Total pack years: 6.25    Types: Cigars, Cigarettes    Quit date: 05/2022    Years since quitting: 0.3   Smokeless tobacco: Former   Tobacco comments:    1-2 cigars daily  Vaping Use   Vaping Use: Some days  Substance and Sexual Activity   Alcohol use: Not Currently    Comment: 4-6 beers daily (decresed to 2 every other day -08/15/2020); quit January 2022   Drug use: Not Currently   Sexual activity: Yes    Birth control/protection: Surgical  Other Topics Concern   Not on file  Social History Narrative   Not on file   Social Determinants of Health   Financial Resource Strain: Not on file  Food Insecurity: Not on file  Transportation Needs: Not on file  Physical Activity: Not on file  Stress: Not on file  Social Connections: Not on file    Review of Systems: Gen: Denies fever, chills, cold or flulike symptoms, presyncope, syncope. CV: Denies chest pain, palpitations. Resp: Denies dyspnea, cough.  GI: See HPI  Heme: See HPI  Physical Exam: There were no vitals taken for this visit. General:   Alert and  oriented. No distress noted. Pleasant and cooperative.  Head:  Normocephalic and atraumatic. Eyes:  Conjuctiva clear without scleral icterus. Heart:  S1, S2 present without murmurs appreciated. Lungs:  Clear to auscultation bilaterally. No wheezes, rales, or rhonchi. No distress.  Abdomen:  +BS, soft, non-tender and non-distended. No rebound or guarding. No HSM or masses noted. Msk:  Symmetrical without gross deformities. Normal posture. Extremities:  Without edema. Neurologic:  Alert and  oriented x4 Psych:  Normal mood and affect.    Assessment:     Plan:  ***   Aliene Altes, PA-C [facility] 09/23/2022

## 2022-09-21 NOTE — H&P (View-Only) (Signed)
Referring Provider: [place] Nation, MD Primary Care Physician:  [place] Nation, MD Primary GI Physician: Dr. Gala Romney  Chief Complaint  Patient presents with   Blood count is low    PCP referred her due to blood count being low. Tired and weak. No blood in stools.     HPI:   Nicole Ewing is a 61 y.o. female with history of GERD, dysphagia resolved with empiric dilation, elevated LFTs, previously with daily alcohol use (abstinent since January 2022), history of intranasal drug use in her 5s, constipation, unintentional weight loss between 2020-2022, presenting today for follow-up.  Extensive evaluation of LFTs previously: Evidence of prior hepatitis C with undetectable RNA indicating past/resolved infection.  Hepatitis B serologies negative.  GGT normal.  ASMA negative.  ANA positive.  AMA elevated at 173.  Immunoglobulins wnl.  MRI with no intra or extrahepatic biliary dilation.  Liver unremarkable other than small cyst. AST/ALT normalized in September 2020.  Alk phos normalized in June 2021.  AMA was rechecked in September 2022 and remained elevated, but was down to 146.  Case discussed with Dr. Gala Romney who recommended ongoing monitoring of LFTs every 6 months and continuing to avoid alcohol.  Last seen in our office 04/05/2022.  GERD well controlled on Protonix 20 mg daily.  Reported orthopedic doctor in [place] requested she discontinue Protonix due to severe osteoporosis.  Constipation fairly well controlled on stool softener daily and MiraLAX as needed.  LFTs had remained normal in March.  Weight was stable over the last 6 months.  Plan to stop Protonix and start Pepcid 20 mg daily, continue Colace and MiraLAX, monitor LFTs every 6 months, encourage patient to increase daily caloric intake and supplement with protein shakes.  Follow-up in 6 months.  Patient called 5/12 reporting frequent reflux symptoms and asking if she could increase Pepcid.  Recommended taking Pepcid 20 mg  twice daily or 40 mg in the a.m.  Received lab results from PCP dated 08/18/2022.  She had hemoglobin of 10.6 (L). No other significant abnormalities on CBC or CMP.  TSH was low at 0.207.  Reviewed labs in [facility] tab dated 08/30/2022 with hemoglobin down to 10.0, normocytic indices, ferritin low normal at 15, iron saturation low at 11%, iron low normal at 42.  Folate and B12 within normal limits. Recommended OV for evaluation.   She has established with [facility] hematology/oncology in [place] who has recommended IV iron.  Stated they would consider bone marrow biopsy if hemoglobin failed to improve with IV iron.  Today:  Feels tired and weak. Intermittent black stools. Started a couple of months ago. Can have bright red blood when she wipes, daily for the last couple of months as well. Intermittent rectal pain, not specifically with bowel movements. Feels a bump beside her anus.  States has been there for a long time.   Bowels are moving well with Colace daily and MiraLAX as needed.  Chronic reflux is well controlled on Pepcid once daily.  Reports chronic tenderness in the epigastric area without pains.  Is been present for many years.  Also with intermittent LUQ discomfort not affected by meals.  States she has to readjust and her symptoms resolved.  No associated nausea or vomiting.  She has been taking BC powders a couple times a week for headaches over the last couple months due to allergies.  No further weight loss. Actually gained 7 lbs since last visit.   Labs: Has blood work this Monday  with PCP.  IV iron: Waiting on insurance approval. Can't tolerate oral iron. Taste it all day.    Colonoscopy March 2021 with 1 hyperplastic polyp, otherwise normal exam.  Recommended 5-year repeat.  EGD April 2022 with normal esophagus s/p dilation and biopsy, patchy erythema of gastric mucosa particularly in the body with a polypoid appearance s/p biopsy, normal examined duodenum.  Pathology with chronic mild  gastritis, negative H. pylori.  Esophageal biopsy benign.   Hemoglobin previously 12.6 in March 2022.   Alcohol: None.    Past Medical History:  Diagnosis Date   Acid reflux    Anxiety    Arthritis    Cervical cancer (HCC)    COPD (chronic obstructive pulmonary disease) (HCC)    Depression    H/O: hysterectomy    Cervical dysplasia   High cholesterol    Hyperthyroidism    Menopause ovarian failure    Migraines    Osteoporosis     Past Surgical History:  Procedure Laterality Date   ABDOMINAL HYSTERECTOMY     BIOPSY  03/19/2021   Procedure: BIOPSY;  Surgeon: Daneil Dolin, MD;  [facility]: [facility];  Service: Endoscopy;;   BREAST SURGERY Right    breast buiopsy   COLONOSCOPY  04/06/2007   [facility]:  Normal rectum, no inflammation, no colonic or rectal polyps, tortuous and redundant colon. Recommendations to repeat TCS in 5 years (2013).    COLONOSCOPY WITH PROPOFOL N/A 02/07/2020   Procedure: COLONOSCOPY WITH PROPOFOL;  Surgeon: Daneil Dolin, MD;  1 hyperplastic polyp. Otherwise normal exam. Recommended repeat in 5 years.    ESOPHAGOGASTRODUODENOSCOPY (EGD) WITH PROPOFOL N/A 03/19/2021   Surgeon: Daneil Dolin, MD;  Normal esophagus s/p dilation and biopsy, patchy erythema of gastric mucosa particularly the body with a polypoid appearance s/p biopsy, normal examined duodenum.  Pathology with chronic mild gastritis, negative for H. pylori.  Esophageal biopsy benign.   MALONEY DILATION N/A 03/19/2021   Procedure: Venia Minks DILATION;  Surgeon: Daneil Dolin, MD;  Location: [facility];  Service: Endoscopy;  Laterality: N/A;   POLYPECTOMY  02/07/2020   Procedure: POLYPECTOMY;  Surgeon: Daneil Dolin, MD;  Location: [facility];  Service: Endoscopy;;   TONSILLECTOMY     TOTAL HIP ARTHROPLASTY Left 11/16/2018   TOTAL HIP ARTHROPLASTY Right 07/02/2019    Current Outpatient Medications  Medication Sig Dispense Refill   Blood Glucose Monitoring Suppl  (ACCU-CHEK GUIDE ME) w/Device KIT 1 Piece by Does not apply route as directed. 1 kit 0   Calcium Carbonate-Vitamin D (CALCIUM 600+D PO) 1233m calcium, 10016mvit D     diazepam (VALIUM) 5 MG tablet Take 5 mg by mouth in the morning and at bedtime.     docusate sodium (COLACE) 100 MG capsule Take 100 mg by mouth daily.     glucose blood (ACCU-CHEK GUIDE) test strip USE ONLY WHEN YOU GET SYMPTOMS OF LOW GLUCOSE 50 strip 2   HYDROcodone-acetaminophen (NORCO/VICODIN) 5-325 MG tablet Take 1 tablet by mouth every 8 (eight) hours as needed for severe pain.     Lancets MISC 1 each by Does not apply route as directed. 100 each 3   loratadine (CLARITIN) 10 MG tablet Take 10 mg by mouth daily.     methocarbamol (ROBAXIN) 750 MG tablet Take 750 mg by mouth 4 (four) times daily as needed for muscle spasms.     Multiple Vitamin (MULTIVITAMIN WITH MINERALS) TABS tablet Take 1 tablet by mouth daily.  pantoprazole (PROTONIX) 40 MG tablet Take 1 tablet (40 mg total) by mouth daily before breakfast. 30 tablet 3   Probiotic Product (PROBIOTIC DAILY PO) Take 1 capsule by mouth daily.     vitamin B-12 (CYANOCOBALAMIN) 250 MCG tablet Take 250 mcg by mouth daily.     Zoledronic Acid (RECLAST IV) Inject into the vein. Once a year. Last had 10/07/21     polyethylene glycol-electrolytes (NULYTELY) 420 g solution Take 4,000 mLs by mouth once for 1 dose. 4000 mL 0   No current facility-administered medications for this visit.    Allergies as of 09/23/2022 - Review Complete 09/23/2022  Allergen Reaction Noted   Tramadol  06/05/2019   Codeine Rash 01/27/2017   Keflex [cephalexin] Rash 01/27/2017    Family History  Problem Relation Age of Onset   Melanoma Father    Graves' disease Child    Colon polyps Brother    Colon cancer Neg Hx     Social History   Socioeconomic History   Marital status: Married    Spouse name: Not on file   Number of children: Not on file   Years of education: Not on file   Highest  education level: Not on file  Occupational History   Not on file  Tobacco Use   Smoking status: Former    Packs/day: 0.25    Years: 25.00    Total pack years: 6.25    Types: Cigars, Cigarettes    Quit date: 05/2022    Years since quitting: 0.3   Smokeless tobacco: Former   Tobacco comments:    1-2 cigars daily  Vaping Use   Vaping Use: Some days  Substance and Sexual Activity   Alcohol use: Not Currently    Comment: 4-6 beers daily (decresed to 2 every other day -08/15/2020); quit January 2022   Drug use: Not Currently   Sexual activity: Yes    Birth control/protection: Surgical  Other Topics Concern   Not on file  Social History Narrative   Not on file   Social Determinants of Health   Financial Resource Strain: Not on file  Food Insecurity: Not on file  Transportation Needs: Not on file  Physical Activity: Not on file  Stress: Not on file  Social Connections: Not on file    Review of Systems: Gen: Denies fever, chills, cold or flulike symptoms, presyncope, syncope. CV: Denies chest pain, palpitations. Resp: Denies dyspnea, cough.  GI: See HPI  Heme: See HPI  Physical Exam: BP (!) 148/73 (BP Location: Right Arm, Patient Position: Sitting, Cuff Size: Normal)   Pulse 78   Temp 98.2 F (36.8 C) (Temporal)   Ht _0  (1.626 m)   Wt 132 lb 3.2 oz (60 kg)   SpO2 99%   BMI 22.69 kg/m  General:   Alert and oriented. No distress noted. Pleasant and cooperative.  Head:  Normocephalic and atraumatic. Eyes:  Conjuctiva clear without scleral icterus. Heart:  S1, S2 present without murmurs appreciated. Lungs:  Clear to auscultation bilaterally. No wheezes, rales, or rhonchi. No distress.  Abdomen:  +BS, soft, and non-distended.Very mild TTP in LUQ region. No rebound or guarding. No HSM or masses noted. Rectal: Fungal appearing rash between buttocks with few excoriations. At the 12 o'clock position, there is a tender external perianal lesion, possibly chronically  irritated external hemorrhoid with tissue that seems more fibrotic and with some skin breakdown in the middle. Small perianal skin tag at 6 o'clock position.  Msk:  Symmetrical without  gross deformities. Normal posture. Extremities:  Without edema. Neurologic:  Alert and  oriented x4 Psych:  Normal mood and affect.    Assessment:  61 y.o. female with history of GERD, dysphagia resolved with empiric dilation, elevated LFTs, previously with daily alcohol use (abstinent since January 2022), history of intranasal drug use in her 48s, constipation, unintentional weight loss between 2020-2022, presenting today for follow-up and to discuss new onset IDA.   IDA:  Hemoglobin previously in the 12.6 in March 2022, down to 10.6 in September 2023.  Most recent labs 08/30/2022 with hemoglobin 10.0, ferritin low normal at 15, iron saturation low at 11%, iron low normal at 42.  Folate and B12 within normal limits.  She has established with [facility] hematology/oncology in [place] who has recommended IV iron (waiting on insurance approval) as she is intolerant to oral iron.  Stated they could consider bone marrow biopsy if hemoglobin failed to improve with IV iron.  Last colonoscopy in March 2021 with 1 hyperplastic polyp, otherwise normal exam, recommended 5-year repeat.  Last EGD April 2022 with normal esophagus s/p dilation and biopsy, patchy erythema of gastric mucosa with polypoid appearance biopsied, normal examined duodenum.  Pathology with chronic mild gastritis, negative H. pylori, esophageal biopsy benign. Today, patient reports new onset intermittent black stools over the last couple of months as well as intermittent toilet tissue hematochezia. Also with intermittent LUQ pain, but this seems more MSK in etiology as symptoms resolve with stretching or changing positions.   I am concerned for peptic ulcer disease, gastritis, duodenitis as she is also been taking BC powders for the last couple of months.  Hematochezia  may be secondary to external perianal lesion, possibly chronically irritated hemorrhoid that appears to have some skin breakdown in the middle, but as colonoscopy was over 2 years ago, unable to rule out colon polyps or malignancy in the setting of new onset IDA.  We will plan to update EGD and colonoscopy.  I will also start her on PPI daily.  Perianal lesion: At the 12 o'clock position, there is an external perianal lesion, possibly chronically irritated external hemorrhoid with tissue that seems more fibrotic as well as with some skin breakdown in the middle.  This is quite tender and also causes quite a bit of problems with cleaning.  We will refer her to general surgery for further evaluation and management.  Perianal rash: Likely fungal.  Recommended clotrimazole cream twice daily.  GERD:  Chronic.  Symptoms well controlled on Pepcid 20 mg daily, but due to new onset IDA, intermittent black stools, and recent BC powder use, will start PPI daily.  Constipation:  Well controlled on Colace daily and MiraLAX as needed.  Elevated LFTs:  Prior LFT elevation likely secondary to daily alcohol use, abstinent since January 2022.  Extensive serologic work-up detailed in HPI revealing positive ANA and elevated AMA, but immunoglobulins within normal limits.  MRI with no biliary abnormalities and unremarkable liver.  AST/ALT normalized in September 2020.  Alk phos normalized in June 2021.  AMA was rechecked in September 2022 and remained elevated, but was down to 146.  Most recent LFTs in September 2023 remain normal. As her liver enzymes have remained normal, we will continue to monitor every 6 months. Case previously discussed with Dr. Gala Romney.    Plan:  Proceed with upper endoscopy + colonoscopy with propofol by Dr. Gala Romney in near future. The risks, benefits, and alternatives have been discussed with the patient in detail. The patient states understanding  and desires to proceed.  ASA 2 Stop pepcid and  start Protonix 40 mg daily. Avoid all NSAIDs.  Keep upcoming appointment on 10/23 with PCP for blood work.  Continue to follow with hematology for IV iron.   Refer to general surgery for perianal lesion.  May try preparation H on perianal lesion to see if this provides any relief while waiting to see general surgery.  Clotrimazole BID x 7-10 days for perianal rash.  Continue Colace daily and MiraLAX as needed. Continue to monitor LFTs every 6 months.  Follow-up after procedures.   Aliene Altes, PA-C [facility] 09/23/2022

## 2022-09-23 ENCOUNTER — Telehealth: Payer: Self-pay | Admitting: *Deleted

## 2022-09-23 ENCOUNTER — Ambulatory Visit: Payer: Medicare Other | Admitting: Gastroenterology

## 2022-09-23 ENCOUNTER — Encounter: Payer: Self-pay | Admitting: *Deleted

## 2022-09-23 ENCOUNTER — Encounter: Payer: Self-pay | Admitting: Gastroenterology

## 2022-09-23 VITALS — BP 148/73 | HR 78 | Temp 98.2°F | Ht 64.0 in | Wt 132.2 lb

## 2022-09-23 DIAGNOSIS — K921 Melena: Secondary | ICD-10-CM

## 2022-09-23 DIAGNOSIS — K5900 Constipation, unspecified: Secondary | ICD-10-CM

## 2022-09-23 DIAGNOSIS — K219 Gastro-esophageal reflux disease without esophagitis: Secondary | ICD-10-CM | POA: Diagnosis not present

## 2022-09-23 DIAGNOSIS — R7989 Other specified abnormal findings of blood chemistry: Secondary | ICD-10-CM | POA: Diagnosis not present

## 2022-09-23 DIAGNOSIS — D509 Iron deficiency anemia, unspecified: Secondary | ICD-10-CM

## 2022-09-23 DIAGNOSIS — K629 Disease of anus and rectum, unspecified: Secondary | ICD-10-CM | POA: Diagnosis not present

## 2022-09-23 DIAGNOSIS — R21 Rash and other nonspecific skin eruption: Secondary | ICD-10-CM | POA: Diagnosis not present

## 2022-09-23 DIAGNOSIS — K625 Hemorrhage of anus and rectum: Secondary | ICD-10-CM

## 2022-09-23 MED ORDER — PEG 3350-KCL-NA BICARB-NACL 420 G PO SOLR: 4000.0000 mL | Freq: Once | ORAL | 0 refills | Status: AC

## 2022-09-23 MED ORDER — PANTOPRAZOLE SODIUM 40 MG PO TBEC: 40.0000 mg | DELAYED_RELEASE_TABLET | Freq: Every day | ORAL | 3 refills | Status: DC

## 2022-09-23 NOTE — Patient Instructions (Addendum)
We will arrange for you to have an upper endoscopy and colonoscopy in the near future with Dr. Gala Romney.  Keep your upcoming appointment with your primary care doctor to have blood work updated on Monday.  Continue to follow with hematology for IV iron as you are not able to tolerate iron by mouth.  We will refer you to general surgery to address the external anal lesion that is causing you pain.  This may be a chronically irritated hemorrhoid. You can use preparation H on this a couple times a day for the next 7 days to see if this helps with irritation.   Stop Pepcid for now and start pantoprazole 40 mg daily 30 minutes before breakfast.  Stop taking BC powders and avoid all NSAID including ibuprofen, Aleve, Advil, Goody powders, BC powders, and anything that says "NSAID" on the package.  Continue Colace daily and MiraLAX as needed for constipation.  For the rash on your buttocks, use over-the-counter clotrimazole cream twice daily for the next 7-10 days.  We will see you back after your procedures. Call sooner if you have questions or concerns.   Aliene Altes, PA-C [facility]

## 2022-09-23 NOTE — Telephone Encounter (Signed)
PA:  APPROVED Authorization #: [id number]  DOS:10/11/22-12/05/22

## 2022-09-27 DIAGNOSIS — D649 Anemia, unspecified: Secondary | ICD-10-CM | POA: Diagnosis not present

## 2022-09-28 DIAGNOSIS — D500 Iron deficiency anemia secondary to blood loss (chronic): Secondary | ICD-10-CM | POA: Diagnosis not present

## 2022-09-29 DIAGNOSIS — F17200 Nicotine dependence, unspecified, uncomplicated: Secondary | ICD-10-CM | POA: Diagnosis not present

## 2022-09-29 DIAGNOSIS — M810 Age-related osteoporosis without current pathological fracture: Secondary | ICD-10-CM | POA: Diagnosis not present

## 2022-09-29 DIAGNOSIS — Z6821 Body mass index (BMI) 21.0-21.9, adult: Secondary | ICD-10-CM | POA: Diagnosis not present

## 2022-09-29 DIAGNOSIS — R030 Elevated blood-pressure reading, without diagnosis of hypertension: Secondary | ICD-10-CM | POA: Diagnosis not present

## 2022-09-29 DIAGNOSIS — K760 Fatty (change of) liver, not elsewhere classified: Secondary | ICD-10-CM | POA: Diagnosis not present

## 2022-09-29 DIAGNOSIS — G8929 Other chronic pain: Secondary | ICD-10-CM | POA: Diagnosis not present

## 2022-09-29 DIAGNOSIS — D649 Anemia, unspecified: Secondary | ICD-10-CM | POA: Diagnosis not present

## 2022-09-29 DIAGNOSIS — R1310 Dysphagia, unspecified: Secondary | ICD-10-CM | POA: Diagnosis not present

## 2022-10-05 ENCOUNTER — Ambulatory Visit: Payer: Medicare Other | Admitting: Surgery

## 2022-10-05 ENCOUNTER — Encounter: Payer: Self-pay | Admitting: Surgery

## 2022-10-05 ENCOUNTER — Ambulatory Visit: Payer: Medicare Other | Admitting: Internal Medicine

## 2022-10-05 ENCOUNTER — Other Ambulatory Visit: Payer: Self-pay

## 2022-10-05 VITALS — BP 119/77 | HR 72 | Temp 98.0°F | Resp 18 | Ht 64.0 in | Wt 132.0 lb

## 2022-10-05 DIAGNOSIS — D500 Iron deficiency anemia secondary to blood loss (chronic): Secondary | ICD-10-CM | POA: Diagnosis not present

## 2022-10-05 DIAGNOSIS — K644 Residual hemorrhoidal skin tags: Secondary | ICD-10-CM

## 2022-10-05 MED ORDER — POLYETHYLENE GLYCOL 3350 17 G PO PACK: 17.0000 g | PACK | Freq: Every day | ORAL | 0 refills | Status: DC | PRN

## 2022-10-05 MED ORDER — AMERICAINE 20 % RE OINT: TOPICAL_OINTMENT | RECTAL | 0 refills | Status: DC | PRN

## 2022-10-05 MED ORDER — HYDROCORTISONE ACETATE 25 MG RE SUPP: 25.0000 mg | Freq: Two times a day (BID) | RECTAL | 0 refills | Status: DC

## 2022-10-05 NOTE — Progress Notes (Signed)
[facility] History and Physical  Reason for Referral: Perianal lesion Referring Physician: Aliene Altes, PA-C  Chief Complaint   New Patient (Initial Visit)     Nicole Ewing is a 61 y.o. female.  HPI: Patient presents for evaluation of hemorrhoids and a perianal lesion.  She states that this anal lesion has been present for 2 years.  She notes pain with wiping and occasional blood.  She denies that this area has changed in size.  She does have a history of hemorrhoids, but states that this feels different than hemorrhoids.  She has daily bowel movements though she states that they are hard and therefore she is taking Colace daily. She is currently taking opioid pain medications for back pain.  Her last colonoscopy was in 2021 and she was noted to have a hyperplastic polyp.  She is currently scheduled for EGD and colonoscopy on 11/6 with Dr. Gala Romney.  She has no family history of colorectal cancer.  She has not tried to treat this lesion.  She also complains of a rash on her right buttock that is also been present for 2 years.  She just started using fungal cream about a week and a half ago.  She denies use of blood thinning medications.  Her surgical history is significant for tonsillectomy, hysterectomy, and bilateral hip replacement.  She denies use of tobacco, alcohol, and illicit drugs.    Past Medical History:  Diagnosis Date   Acid reflux    Anxiety    Arthritis    Cervical cancer (HCC)    COPD (chronic obstructive pulmonary disease) (HCC)    Depression    H/O: hysterectomy    Cervical dysplasia   High cholesterol    Hyperthyroidism    Menopause ovarian failure    Migraines    Osteoporosis     Past Surgical History:  Procedure Laterality Date   ABDOMINAL HYSTERECTOMY     BIOPSY  03/19/2021   Procedure: BIOPSY;  Surgeon: Daneil Dolin, MD;  Location: [facility];  Service: Endoscopy;;   BREAST SURGERY Right    breast buiopsy   COLONOSCOPY   04/06/2007   [facility]:  Normal rectum, no inflammation, no colonic or rectal polyps, tortuous and redundant colon. Recommendations to repeat TCS in 5 years (2013).    COLONOSCOPY WITH PROPOFOL N/A 02/07/2020   Procedure: COLONOSCOPY WITH PROPOFOL;  Surgeon: Daneil Dolin, MD;  1 hyperplastic polyp. Otherwise normal exam. Recommended repeat in 5 years.    ESOPHAGOGASTRODUODENOSCOPY (EGD) WITH PROPOFOL N/A 03/19/2021   Surgeon: Daneil Dolin, MD;  Normal esophagus s/p dilation and biopsy, patchy erythema of gastric mucosa particularly the body with a polypoid appearance s/p biopsy, normal examined duodenum.  Pathology with chronic mild gastritis, negative for H. pylori.  Esophageal biopsy benign.   MALONEY DILATION N/A 03/19/2021   Procedure: Venia Minks DILATION;  Surgeon: Daneil Dolin, MD;  Location: [facility];  Service: Endoscopy;  Laterality: N/A;   POLYPECTOMY  02/07/2020   Procedure: POLYPECTOMY;  Surgeon: Daneil Dolin, MD;  Location: [facility];  Service: Endoscopy;;   TONSILLECTOMY     TOTAL HIP ARTHROPLASTY Left 11/16/2018   TOTAL HIP ARTHROPLASTY Right 07/02/2019    Family History  Problem Relation Age of Onset   Melanoma Father    Graves' disease Child    Colon polyps Brother    Colon cancer Neg Hx     Social History   Tobacco Use   Smoking status: Former  Packs/day: 0.25    Years: 25.00    Total pack years: 6.25    Types: Cigars, Cigarettes    Quit date: 05/2022    Years since quitting: 0.4   Smokeless tobacco: Former   Tobacco comments:    1-2 cigars daily  Vaping Use   Vaping Use: Some days  Substance Use Topics   Alcohol use: Not Currently    Comment: 4-6 beers daily (decresed to 2 every other day -08/15/2020); quit January 2022   Drug use: Not Currently    Medications: I have reviewed the patient's current medications. Allergies as of 10/05/2022       Reactions   Codeine Rash, Hives, Nausea And Vomiting   Orphenadrine Nausea And  Vomiting   Keflex [cephalexin] Rash   Tramadol Nausea And Vomiting, Nausea Only   Sick on stomach  Sick on stomach         Medication List        Accurate as of October 05, 2022 11:12 AM. If you have any questions, ask your nurse or doctor.          Accu-Chek Guide Me w/Device Kit 1 Piece by Does not apply route as directed.   Accu-Chek Guide test strip Generic drug: glucose blood USE ONLY WHEN YOU GET SYMPTOMS OF LOW GLUCOSE   CALCIUM 600+D PO 1279m calcium, 10050mvit D   diazepam 5 MG tablet Commonly known as: VALIUM Take 5 mg by mouth in the morning and at bedtime.   docusate sodium 100 MG capsule Commonly known as: COLACE Take 100 mg by mouth daily.   HYDROcodone-acetaminophen 5-325 MG tablet Commonly known as: NORCO/VICODIN Take 1 tablet by mouth every 8 (eight) hours as needed for severe pain.   Lancets Misc 1 each by Does not apply route as directed.   loratadine 10 MG tablet Commonly known as: CLARITIN Take 10 mg by mouth daily.   methocarbamol 750 MG tablet Commonly known as: ROBAXIN Take 750 mg by mouth 4 (four) times daily as needed for muscle spasms.   multivitamin with minerals Tabs tablet Take 1 tablet by mouth daily.   pantoprazole 40 MG tablet Commonly known as: PROTONIX Take 1 tablet (40 mg total) by mouth daily before breakfast.   PROBIOTIC DAILY PO Take 1 capsule by mouth daily.   RECLAST IV Inject into the vein. Once a year. Last had 10/07/21   vitamin B-12 250 MCG tablet Commonly known as: CYANOCOBALAMIN Take 250 mcg by mouth daily.         ROS:  Constitutional: negative for chills, fatigue, and fevers Eyes: negative for visual disturbance and pain Ears, nose, mouth, throat, and face: negative for ear drainage, sore throat, and sinus problems Respiratory: negative for cough, wheezing, and shortness of breath Cardiovascular: negative for chest pain and palpitations Gastrointestinal: positive for abdominal pain and  reflux symptoms, negative for nausea and vomiting Genitourinary:negative for dysuria, frequency, and urinary retention Integument/breast: negative for dryness and rash Hematologic/lymphatic: negative for bleeding and lymphadenopathy Musculoskeletal:positive for back pain, neck pain, and joint pain Neurological: positive for dizziness and numbness, negative for tremors Endocrine: negative for temperature intolerance  Blood pressure 119/77, pulse 72, temperature 98 F (36.7 C), temperature source Oral, resp. rate 18, height _0  (1.626 m), weight 132 lb (59.9 kg), SpO2 99 %. Physical Exam Vitals reviewed.  Constitutional:      Appearance: Normal appearance.  HENT:     Head: Normocephalic and atraumatic.  Eyes:     Extraocular Movements:  Extraocular movements intact.     Pupils: Pupils are equal, round, and reactive to light.  Cardiovascular:     Rate and Rhythm: Normal rate and regular rhythm.  Pulmonary:     Effort: Pulmonary effort is normal.     Breath sounds: Normal breath sounds.  Abdominal:     General: There is no distension.     Palpations: Abdomen is soft.     Tenderness: There is no abdominal tenderness.  Genitourinary:    Comments: Patient with external hemorrhoid with some overlying ulceration at the 6 o'clock position; no evidence of thrombosis; no other external hemorrhoids noted; palpable internal hemorrhoids on digital rectal exam, normal anal sphincter tone; fungal appearing rash on right buttock extending towards anal verge Musculoskeletal:        General: Normal range of motion.     Cervical back: Normal range of motion.  Skin:    General: Skin is warm and dry.  Neurological:     General: No focal deficit present.     Mental Status: She is alert and oriented to person, place, and time.  Psychiatric:        Mood and Affect: Mood normal.        Behavior: Behavior normal.     Results: No results found for this or any previous visit (from the past 48  hour(s)).  No results found.   Assessment & Plan:  ANIAYAH ALANIZ is a 61 y.o. female who presents for evaluation of hemorrhoids.  -I explained that based on her physical exam, this lesion that she is feeling appears to be a hemorrhoid with some overlying ulceration -Hemorrhoid surgery for external hemorrhoids is very painful. The pain and discomfort that the patient is having currently will be magnified after the surgery for at least 2-3 weeks.  The patient will have feelings of constant pressure and pain in the area from the swelling and removal of the anoderm (skin around the anus). The internal hemorrhoids are not painful to remove because the same nerves are not involved, and the sensation is different, but removal of any external hemorrhoids will cause significant discomfort. They will need at least 4-6 weeks to recover from the surgery, and should not expect to be able to feel back to "normal for 6-8 weeks."   -We will attempt conservative management of the patient's hemorrhoid pain -Prescriptions provided for Anusol suppository, Americaine ointment, and MiraLAX.  We discussed the importance of good bowel hygiene.  I also advised the patient that she should increase her fiber intake with a goal of 30 to 40 g daily. -Advised the patient that she should perform sitz bath after bowel movements and use wet wipes to clean herself after a bowel movement -She should still proceed with her EGD and colonoscopy next week -Advised her to continue using fungal cream to treat this rash -Information provided to the patient regarding hemorrhoids and high-fiber diet -Follow up with me in 4 weeks  All questions were answered to the satisfaction of the patient.   Graciella Freer, DO [facility] [address] [place], [place] [address] [phone] (office)

## 2022-10-05 NOTE — Patient Instructions (Signed)
-   Use Americaine ointment as needed for pain.  May apply over hemorrhoid and anus - Continue to use over the counter suppositories - Take Sitz baths (shallow warm water baths) for comfort and after bowel movements.  - Use wet wipes after bowel movements - Please keep your stools soft and take fiber daily (metamucil) and colace (over the counter) to help prevent constipation.   Follow up in 4 weeks for no resolution in pain

## 2022-10-07 ENCOUNTER — Telehealth: Payer: Self-pay | Admitting: *Deleted

## 2022-10-07 NOTE — Telephone Encounter (Signed)
Pt agreed to move her procedure time up to 7:30 am, informed pt to check in at 6:30 am on 10/11/22

## 2022-10-07 NOTE — Telephone Encounter (Signed)
Called pt and LMOVM to call back to see if we can move procedure up to 7:30am on Monday with arrival of 6:30am.

## 2022-10-11 ENCOUNTER — Ambulatory Visit (HOSPITAL_COMMUNITY): Payer: Medicare Other | Admitting: Anesthesiology

## 2022-10-11 ENCOUNTER — Encounter (HOSPITAL_COMMUNITY)
Admission: RE | Disposition: A | Discharge status: Home / Self Care | Payer: Self-pay | Source: Ambulatory Visit | Attending: Internal Medicine

## 2022-10-11 ENCOUNTER — Ambulatory Visit (HOSPITAL_BASED_OUTPATIENT_CLINIC_OR_DEPARTMENT_OTHER): Payer: Medicare Other | Admitting: Anesthesiology

## 2022-10-11 ENCOUNTER — Encounter (HOSPITAL_COMMUNITY): Payer: Self-pay | Admitting: Internal Medicine

## 2022-10-11 ENCOUNTER — Telehealth: Payer: Self-pay

## 2022-10-11 ENCOUNTER — Other Ambulatory Visit: Payer: Self-pay

## 2022-10-11 ENCOUNTER — Ambulatory Visit (HOSPITAL_COMMUNITY)
Admission: RE | Admit: 2022-10-11 | Discharge: 2022-10-11 | Disposition: A | Discharge status: Home / Self Care | Payer: Medicare Other | Source: Ambulatory Visit | Attending: Internal Medicine | Admitting: Internal Medicine

## 2022-10-11 DIAGNOSIS — K219 Gastro-esophageal reflux disease without esophagitis: Secondary | ICD-10-CM | POA: Diagnosis not present

## 2022-10-11 DIAGNOSIS — K264 Chronic or unspecified duodenal ulcer with hemorrhage: Secondary | ICD-10-CM | POA: Diagnosis not present

## 2022-10-11 DIAGNOSIS — K641 Second degree hemorrhoids: Secondary | ICD-10-CM | POA: Diagnosis not present

## 2022-10-11 DIAGNOSIS — D509 Iron deficiency anemia, unspecified: Secondary | ICD-10-CM

## 2022-10-11 DIAGNOSIS — E0590 Thyrotoxicosis, unspecified without thyrotoxic crisis or storm: Secondary | ICD-10-CM | POA: Insufficient documentation

## 2022-10-11 DIAGNOSIS — Z79899 Other long term (current) drug therapy: Secondary | ICD-10-CM | POA: Insufficient documentation

## 2022-10-11 DIAGNOSIS — K644 Residual hemorrhoidal skin tags: Secondary | ICD-10-CM | POA: Insufficient documentation

## 2022-10-11 DIAGNOSIS — K254 Chronic or unspecified gastric ulcer with hemorrhage: Secondary | ICD-10-CM | POA: Diagnosis not present

## 2022-10-11 DIAGNOSIS — K259 Gastric ulcer, unspecified as acute or chronic, without hemorrhage or perforation: Secondary | ICD-10-CM

## 2022-10-11 DIAGNOSIS — D500 Iron deficiency anemia secondary to blood loss (chronic): Secondary | ICD-10-CM

## 2022-10-11 DIAGNOSIS — Z8619 Personal history of other infectious and parasitic diseases: Secondary | ICD-10-CM | POA: Diagnosis not present

## 2022-10-11 DIAGNOSIS — J449 Chronic obstructive pulmonary disease, unspecified: Secondary | ICD-10-CM | POA: Insufficient documentation

## 2022-10-11 DIAGNOSIS — Z87891 Personal history of nicotine dependence: Secondary | ICD-10-CM | POA: Insufficient documentation

## 2022-10-11 DIAGNOSIS — K5900 Constipation, unspecified: Secondary | ICD-10-CM

## 2022-10-11 DIAGNOSIS — R945 Abnormal results of liver function studies: Secondary | ICD-10-CM | POA: Diagnosis not present

## 2022-10-11 DIAGNOSIS — K319 Disease of stomach and duodenum, unspecified: Secondary | ICD-10-CM | POA: Insufficient documentation

## 2022-10-11 DIAGNOSIS — R7989 Other specified abnormal findings of blood chemistry: Secondary | ICD-10-CM

## 2022-10-11 HISTORY — DX: Anemia, unspecified: D64.9

## 2022-10-11 HISTORY — PX: ESOPHAGOGASTRODUODENOSCOPY (EGD) WITH PROPOFOL: SHX5813

## 2022-10-11 HISTORY — PX: BIOPSY: SHX5522

## 2022-10-11 HISTORY — PX: COLONOSCOPY WITH PROPOFOL: SHX5780

## 2022-10-11 SURGERY — COLONOSCOPY WITH PROPOFOL
Anesthesia: General

## 2022-10-11 MED ORDER — LACTATED RINGERS IV SOLN
INTRAVENOUS | Status: DC
  Administered 2022-10-11: 1000 mL via INTRAVENOUS

## 2022-10-11 MED ORDER — LIDOCAINE HCL (CARDIAC) PF 100 MG/5ML IV SOSY
PREFILLED_SYRINGE | INTRAVENOUS | Status: DC | PRN
  Administered 2022-10-11: 50 mg via INTRAVENOUS

## 2022-10-11 MED ORDER — PROPOFOL 10 MG/ML IV BOLUS
INTRAVENOUS | Status: DC | PRN
  Administered 2022-10-11 (×2): 50 mg via INTRAVENOUS
  Administered 2022-10-11: 100 mg via INTRAVENOUS

## 2022-10-11 MED ORDER — STERILE WATER FOR IRRIGATION IR SOLN
Status: DC | PRN
  Administered 2022-10-11: 60 mL

## 2022-10-11 MED ORDER — PHENYLEPHRINE 80 MCG/ML (10ML) SYRINGE FOR IV PUSH (FOR BLOOD PRESSURE SUPPORT)
PREFILLED_SYRINGE | INTRAVENOUS | Status: DC | PRN
  Administered 2022-10-11: 80 ug via INTRAVENOUS
  Administered 2022-10-11: 160 ug via INTRAVENOUS
  Administered 2022-10-11: 80 ug via INTRAVENOUS
  Administered 2022-10-11: 160 ug via INTRAVENOUS
  Administered 2022-10-11: 80 ug via INTRAVENOUS

## 2022-10-11 MED ORDER — PROPOFOL 500 MG/50ML IV EMUL
INTRAVENOUS | Status: DC | PRN
  Administered 2022-10-11: 150 ug/kg/min via INTRAVENOUS

## 2022-10-11 MED ORDER — PANTOPRAZOLE SODIUM 40 MG PO TBEC: 40.0000 mg | DELAYED_RELEASE_TABLET | Freq: Two times a day (BID) | ORAL | 3 refills | Status: DC

## 2022-10-11 NOTE — Transfer of Care (Signed)
Immediate Anesthesia Transfer of Care Note  Patient: Nicole Ewing  Procedure(s) Performed: COLONOSCOPY WITH PROPOFOL ESOPHAGOGASTRODUODENOSCOPY (EGD) WITH PROPOFOL BIOPSY  Patient Location: Endoscopy Unit  Anesthesia Type:General  Level of Consciousness: awake, alert , and oriented  Airway & Oxygen Therapy: Patient Spontanous Breathing  Post-op Assessment: Report given to RN and Post -op Vital signs reviewed and stable  Post vital signs: Reviewed and stable  Last Vitals:  Vitals Value Taken Time  BP 103/56 10/11/22 0816  Temp 36.7 C 10/11/22 0816  Pulse 81 10/11/22 0816  Resp 19 10/11/22 0816  SpO2 100 % 10/11/22 0816    Last Pain:  Vitals:   10/11/22 0816  TempSrc: Oral  PainSc:       Patients Stated Pain Goal: 6 (40/37/54 3606)  Complications: No notable events documented.

## 2022-10-11 NOTE — Discharge Instructions (Addendum)
Colonoscopy Discharge Instructions  Read the instructions outlined below and refer to this sheet in the next few weeks. These discharge instructions provide you with general information on caring for yourself after you leave the hospital. Your doctor may also give you specific instructions. While your treatment has been planned according to the most current medical practices available, unavoidable complications occasionally occur. If you have any problems or questions after discharge, call Dr. Gala Romney at [phone]. ACTIVITY You may resume your regular activity, but move at a slower pace for the next 24 hours.  Take frequent rest periods for the next 24 hours.  Walking will help get rid of the air and reduce the bloated feeling in your belly (abdomen).  No driving for 24 hours (because of the medicine (anesthesia) used during the test).   Do not sign any important legal documents or operate any machinery for 24 hours (because of the anesthesia used during the test).  NUTRITION Drink plenty of fluids.  You may resume your normal diet as instructed by your doctor.  Begin with a light meal and progress to your normal diet. Heavy or fried foods are harder to digest and may make you feel sick to your stomach (nauseated).  Avoid alcoholic beverages for 24 hours or as instructed.  MEDICATIONS You may resume your normal medications unless your doctor tells you otherwise.  WHAT YOU CAN EXPECT TODAY Some feelings of bloating in the abdomen.  Passage of more gas than usual.  Spotting of blood in your stool or on the toilet paper.  IF YOU HAD POLYPS REMOVED DURING THE COLONOSCOPY: No aspirin products for 7 days or as instructed.  No alcohol for 7 days or as instructed.  Eat a soft diet for the next 24 hours.  FINDING OUT THE RESULTS OF YOUR TEST Not all test results are available during your visit. If your test results are not back during the visit, make an appointment with your caregiver to find out the  results. Do not assume everything is normal if you have not heard from your caregiver or the medical facility. It is important for you to follow up on all of your test results.  SEEK IMMEDIATE MEDICAL ATTENTION IF: You have more than a spotting of blood in your stool.  Your belly is swollen (abdominal distention).  You are nauseated or vomiting.  You have a temperature over 101.  You have abdominal pain or discomfort that is severe or gets worse throughout the day.    EGD Discharge instructions Please read the instructions outlined below and refer to this sheet in the next few weeks. These discharge instructions provide you with general information on caring for yourself after you leave the hospital. Your doctor may also give you specific instructions. While your treatment has been planned according to the most current medical practices available, unavoidable complications occasionally occur. If you have any problems or questions after discharge, please call your doctor. ACTIVITY You may resume your regular activity but move at a slower pace for the next 24 hours.  Take frequent rest periods for the next 24 hours.  Walking will help expel (get rid of) the air and reduce the bloated feeling in your abdomen.  No driving for 24 hours (because of the anesthesia (medicine) used during the test).  You may shower.  Do not sign any important legal documents or operate any machinery for 24 hours (because of the anesthesia used during the test).  NUTRITION Drink plenty of fluids.  You  may resume your normal diet.  Begin with a light meal and progress to your normal diet.  Avoid alcoholic beverages for 24 hours or as instructed by your caregiver.  MEDICATIONS You may resume your normal medications unless your caregiver tells you otherwise.  WHAT YOU CAN EXPECT TODAY You may experience abdominal discomfort such as a feeling of fullness or "gas" pains.  FOLLOW-UP Your doctor will discuss the results  of your test with you.  SEEK IMMEDIATE MEDICAL ATTENTION IF ANY OF THE FOLLOWING OCCUR: Excessive nausea (feeling sick to your stomach) and/or vomiting.  Severe abdominal pain and distention (swelling).  Trouble swallowing.  Temperature over 101 F (37.8 C).  Rectal bleeding or vomiting of blood.     You have a stomach ulcer.  Likely related to [facility] powders.  Samples taken.  Hemorrhoids found on colonoscopy no polyps  Is recommended you return in 3 months for repeat EGD  Repeat colonoscopy for screening purposes in 10 years   increase Protonix to 40 mg twice daily before breakfast and supper -  new prescription provided through the office   follow-up appointment with Aliene Altes in 3 months   at patient request, I called Carolynn Comment at [phone] -

## 2022-10-11 NOTE — Telephone Encounter (Signed)
Rx was sent to pharmacy on file.  

## 2022-10-11 NOTE — Op Note (Signed)
[facility] Patient Name: Nicole Ewing Procedure Date: 10/11/2022 7:13 AM MRN: [id number] Date of Birth: December 15, 1960 Attending MD: Norvel Richards , MD, [id number] CSN: [id number] Age: 61 Admit Type: Outpatient Procedure:                Colonoscopy Indications:              Iron deficiency anemia Providers:                Norvel Richards, MD, Caprice Kluver, Aram Candela Referring MD:              Medicines:                Propofol per Anesthesia Complications:            No immediate complications. Estimated Blood Loss:     Estimated blood loss was minimal. Procedure:                Pre-Anesthesia Assessment:                           - Prior to the procedure, a History and Physical                            was performed, and patient medications and                            allergies were reviewed. The patient's tolerance of                            previous anesthesia was also reviewed. The risks                            and benefits of the procedure and the sedation                            options and risks were discussed with the patient.                            All questions were answered, and informed consent                            was obtained. Prior Anticoagulants: The patient has                            taken no anticoagulant or antiplatelet agents. ASA                            Grade Assessment: II - A patient with mild systemic                            disease. After reviewing the risks and benefits,                            the patient was deemed in satisfactory condition to  undergo the procedure.                           After obtaining informed consent, the colonoscope                            was passed under direct vision. Throughout the                            procedure, the patient's blood pressure, pulse, and                            oxygen saturations were monitored continuously. The                             [id number]) scope was introduced through the                            anus and advanced to the the cecum, identified by                            appendiceal orifice and ileocecal valve. The                            colonoscopy was performed without difficulty. The                            patient tolerated the procedure well. The quality                            of the bowel preparation was adequate. The entire                            colon was well visualized. Scope In: 7:57:51 AM Scope Out: 8:13:40 AM Scope Withdrawal Time: 0 hours 7 minutes 5 seconds  Total Procedure Duration: 0 hours 15 minutes 49 seconds  Findings:      Hemorrhoids were found on perianal exam.      Non-bleeding external and internal hemorrhoids were found during       retroflexion. The hemorrhoids were moderate, medium-sized and Grade II       (internal hemorrhoids that prolapse but reduce spontaneously).      The colon (entire examined portion) appeared normal.      The exam was otherwise without abnormality on direct and retroflexion       views. Distal 10 cm of TI also appeared normal. Impression:               - Hemorrhoids found on perianal exam.                           - Non-bleeding external and internal hemorrhoids.                           - The entire examined colon is normal.                           -  The examination was otherwise normal on direct                            and retroflexion views. Normal-appearing terminal                            ileum.                           - No specimens collected. Moderate Sedation:      Moderate (conscious) sedation was personally administered by an       anesthesia professional. The following parameters were monitored: oxygen       saturation, heart rate, blood pressure, respiratory rate, EKG, adequacy       of pulmonary ventilation, and response to care. Recommendation:           - Patient has a contact number  available for                            emergencies. The signs and symptoms of potential                            delayed complications were discussed with the                            patient. Return to normal activities tomorrow.                            Written discharge instructions were provided to the                            patient.                           - Advance diet as tolerated.                           - Continue present medications.                           - Repeat colonoscopy in 10 years for screening                            purposes.                           - Return to GI office (date not yet determined).                            See EGD report. Procedure Code(s):        --- Professional ---                           [phone], Colonoscopy, flexible; diagnostic, including                            collection of specimen(s) by brushing or  washing,                            when performed (separate procedure) Diagnosis Code(s):        --- Professional ---                           K64.1, Second degree hemorrhoids                           D50.9, Iron deficiency anemia, unspecified CPT copyright 2022 American Medical Association. All rights reserved. The codes documented in this report are preliminary and upon coder review may  be revised to meet current compliance requirements. Cristopher Estimable. [name], MD Norvel Richards, MD 10/11/2022 8:21:39 AM This report has been signed electronically. Number of Addenda: 0

## 2022-10-11 NOTE — Interval H&P Note (Signed)
History and Physical Interval Note:  10/11/2022 7:21 AM  [name]  has presented today for surgery, with the diagnosis of IDA,GERD,CONSTIPATION,ELEVATED LFT'S.  The various methods of treatment have been discussed with the patient and family. After consideration of risks, benefits and other options for treatment, the patient has consented to  Procedure(s) with comments: COLONOSCOPY WITH PROPOFOL (N/A) - 1:30 pm ESOPHAGOGASTRODUODENOSCOPY (EGD) WITH PROPOFOL (N/A) as a surgical intervention.  The patient's history has been reviewed, patient examined, no change in status, stable for surgery.  I have reviewed the patient's chart and labs.  Questions were answered to the patient's satisfaction.     Manus Rudd   Patient seen and examined.  No change.  Denies dysphagia.  Saw surgeon recently felt patient had symptomatic hemorrhoids.  Need for diagnostic EGD and colonoscopy today. The risks, benefits, limitations, imponderables and alternatives regarding both EGD and colonoscopy have been reviewed with the patient. Questions have been answered. All parties agreeable.

## 2022-10-11 NOTE — Anesthesia Preprocedure Evaluation (Addendum)
Anesthesia Evaluation  Patient identified by MRN, date of birth, ID band Patient awake    Reviewed: Allergy & Precautions, H&P , NPO status , Patient's Chart, lab work & pertinent test results  Airway Mallampati: II  TM Distance: >3 FB Neck ROM: Full    Dental  (+) Dental Advisory Given, Missing   Pulmonary COPD, Patient abstained from smoking., former smoker   Pulmonary exam normal breath sounds clear to auscultation       Cardiovascular negative cardio ROS Normal cardiovascular exam Rhythm:Regular Rate:Normal     Neuro/Psych  Headaches PSYCHIATRIC DISORDERS Anxiety Depression       GI/Hepatic Neg liver ROS,GERD  Medicated and Controlled,,  Endo/Other   Hyperthyroidism   Renal/GU negative Renal ROS  Female GU complaint (Cervical cancer)     Musculoskeletal  (+) Arthritis , Osteoarthritis,    Abdominal   Peds negative pediatric ROS (+)  Hematology  (+) Blood dyscrasia, anemia   Anesthesia Other Findings   Reproductive/Obstetrics negative OB ROS                             Anesthesia Physical Anesthesia Plan  ASA: 2  Anesthesia Plan: General   Post-op Pain Management: Minimal or no pain anticipated   Induction: Intravenous  PONV Risk Score and Plan: 1 and TIVA  Airway Management Planned: Nasal Cannula and Natural Airway  Additional Equipment:   Intra-op Plan:   Post-operative Plan:   Informed Consent: I have reviewed the patients History and Physical, chart, labs and discussed the procedure including the risks, benefits and alternatives for the proposed anesthesia with the patient or authorized representative who has indicated his/her understanding and acceptance.     Dental advisory given  Plan Discussed with: CRNA and Surgeon  Anesthesia Plan Comments:        Anesthesia Quick Evaluation

## 2022-10-11 NOTE — Telephone Encounter (Signed)
-----   Message from Daneil Dolin, MD sent at 10/11/2022  8:27 AM EST -----  needs Protonix 40 mg twice daily dispense 60 with 3 refills take 1 twice daily before meals

## 2022-10-11 NOTE — Anesthesia Postprocedure Evaluation (Signed)
Anesthesia Post Note  Patient: Nicole Ewing  Procedure(s) Performed: COLONOSCOPY WITH PROPOFOL ESOPHAGOGASTRODUODENOSCOPY (EGD) WITH PROPOFOL BIOPSY  Patient location during evaluation: Phase II Anesthesia Type: General Level of consciousness: awake and alert and oriented Pain management: pain level controlled Vital Signs Assessment: post-procedure vital signs reviewed and stable Respiratory status: spontaneous breathing, nonlabored ventilation and respiratory function stable Cardiovascular status: blood pressure returned to baseline and stable Postop Assessment: no apparent nausea or vomiting Anesthetic complications: no  No notable events documented.   Last Vitals:  Vitals:   10/11/22 0701 10/11/22 0816  BP: 105/77 (!) 103/56  Pulse: 88 81  Resp: 11 19  Temp: 36.7 C 36.7 C  SpO2: 100% 100%    Last Pain:  Vitals:   10/11/22 0816  TempSrc: Oral  PainSc:                  [name] [name]

## 2022-10-12 ENCOUNTER — Encounter: Payer: Self-pay | Admitting: Internal Medicine

## 2022-10-12 LAB — SURGICAL PATHOLOGY

## 2022-10-12 NOTE — Op Note (Signed)
[facility] Patient Name: Nicole Ewing Procedure Date: 10/11/2022 7:13 AM MRN: [id number] Date of Birth: 03/02/1961 Attending MD: Norvel Richards , MD, [id number] CSN: [id number] Age: 61 Admit Type: Outpatient Procedure:                Upper GI endoscopy Indications:              Iron deficiency anemia Providers:                Norvel Richards, MD, Caprice Kluver, Aram Candela Referring MD:              Medicines:                Propofol per Anesthesia Complications:            No immediate complications. Estimated Blood Loss:     Estimated blood loss was minimal. Procedure:                Pre-Anesthesia Assessment:                           - Prior to the procedure, a History and Physical                            was performed, and patient medications and                            allergies were reviewed. The patient's tolerance of                            previous anesthesia was also reviewed. The risks                            and benefits of the procedure and the sedation                            options and risks were discussed with the patient.                            All questions were answered, and informed consent                            was obtained. Prior Anticoagulants: The patient has                            taken no anticoagulant or antiplatelet agents. ASA                            Grade Assessment: II - A patient with mild systemic                            disease. After reviewing the risks and benefits,                            the patient was deemed in satisfactory condition to  undergo the procedure.                           After obtaining informed consent, the endoscope was                            passed under direct vision. Throughout the                            procedure, the patient's blood pressure, pulse, and                            oxygen saturations were monitored continuously. The                             [id number]-H190 ([id number]) scope was introduced through the                            mouth, and advanced to the second part of duodenum.                            The upper GI endoscopy was accomplished without                            difficulty. The patient tolerated the procedure                            well. Scope In: 7:41:18 AM Scope Out: 7:50:29 AM Total Procedure Duration: 0 hours 9 minutes 11 seconds  Findings:      The examined esophagus was normal. Gastric cavity empty. (1) 6 mm ulcer       in the prepyloric antral mucosa with surrounding erosions. Please see       photos. Pylorus remains patent. Multiple tiny duodenal bulbar and D2       erosions.      Biopsies of the second portion of the duodenum taken. Also, biopsies of       the antral ulcer taken. This was done without complication. Minimal       bleeding. Impression:               - Normal esophagus. Gastric ulcer with surrounding                            erosions - status post biopsy; duodenal erosions -                            status post duodenal biopsy                           -Ongoing aspirin use in the way of BC powders                            -likely contributing factor to iron deficiency  anemia. Moderate Sedation:      Moderate (conscious) sedation was personally administered by an       anesthesia professional. The following parameters were monitored: oxygen       saturation, heart rate, blood pressure, respiratory rate, EKG, adequacy       of pulmonary ventilation, and response to care. Recommendation:           - Patient has a contact number available for                            emergencies. The signs and symptoms of potential                            delayed complications were discussed with the                            patient. Return to normal activities tomorrow.                            Written discharge instructions were provided to the                             patient.                           - Advance diet as tolerated. Protonix 40 mg twice                            daily before breakfast and supper. Further                            recommendations to follow pending review of                            pathology report. See colonoscopy report. Procedure Code(s):        --- Professional ---                           [phone], Esophagogastroduodenoscopy, flexible,                            transoral; diagnostic, including collection of                            specimen(s) by brushing or washing, when performed                            (separate procedure) Diagnosis Code(s):        --- Professional ---                           D50.9, Iron deficiency anemia, unspecified CPT copyright 2022 American Medical Association. All rights reserved. The codes documented in this report are preliminary and upon coder review may  be revised to meet current compliance requirements. Cristopher Estimable. Li Fragoso, MD Norvel Richards, MD 10/12/2022 1:31:55 PM This report has been signed electronically. Number  of Addenda: 0

## 2022-10-15 ENCOUNTER — Encounter (HOSPITAL_COMMUNITY): Payer: Self-pay | Admitting: Internal Medicine

## 2022-10-18 DIAGNOSIS — Z87891 Personal history of nicotine dependence: Secondary | ICD-10-CM | POA: Diagnosis not present

## 2022-10-18 DIAGNOSIS — I700 Atherosclerosis of aorta: Secondary | ICD-10-CM | POA: Diagnosis not present

## 2022-10-18 DIAGNOSIS — I2510 Atherosclerotic heart disease of native coronary artery without angina pectoris: Secondary | ICD-10-CM | POA: Diagnosis not present

## 2022-10-20 DIAGNOSIS — M810 Age-related osteoporosis without current pathological fracture: Secondary | ICD-10-CM | POA: Diagnosis not present

## 2022-11-02 ENCOUNTER — Ambulatory Visit: Payer: Medicare Other | Admitting: Surgery

## 2022-11-02 DIAGNOSIS — I10 Essential (primary) hypertension: Secondary | ICD-10-CM | POA: Diagnosis not present

## 2022-11-02 DIAGNOSIS — E0590 Thyrotoxicosis, unspecified without thyrotoxic crisis or storm: Secondary | ICD-10-CM | POA: Diagnosis not present

## 2022-11-02 DIAGNOSIS — R768 Other specified abnormal immunological findings in serum: Secondary | ICD-10-CM | POA: Diagnosis not present

## 2022-11-02 DIAGNOSIS — D500 Iron deficiency anemia secondary to blood loss (chronic): Secondary | ICD-10-CM | POA: Diagnosis not present

## 2022-11-03 ENCOUNTER — Other Ambulatory Visit: Payer: Self-pay | Admitting: "Endocrinology

## 2022-11-03 ENCOUNTER — Telehealth: Payer: Self-pay | Admitting: "Endocrinology

## 2022-11-03 DIAGNOSIS — E0520 Thyrotoxicosis with toxic multinodular goiter without thyrotoxic crisis or storm: Secondary | ICD-10-CM

## 2022-11-03 NOTE — Telephone Encounter (Signed)
-----   Message from Cassandria Anger, MD sent at 11/03/2022  1:34 PM EST ----- I want this patient to have repeat thyroid uptake and scan and move up her appointment in 2 weeks.

## 2022-11-03 NOTE — Telephone Encounter (Signed)
Pt made aware. Will schedule pt an appt with Dr.Nida as soon as we have a date for her uptake and scan.

## 2022-11-03 NOTE — Telephone Encounter (Signed)
Nicole Ewing & Nicole Ewing I sent you a staff message on this. He received labs on her from her PCP Can one of you call and go over this with her?  Thanks

## 2022-11-09 DIAGNOSIS — D500 Iron deficiency anemia secondary to blood loss (chronic): Secondary | ICD-10-CM | POA: Diagnosis not present

## 2022-11-09 DIAGNOSIS — D638 Anemia in other chronic diseases classified elsewhere: Secondary | ICD-10-CM | POA: Diagnosis not present

## 2022-11-17 ENCOUNTER — Encounter (HOSPITAL_COMMUNITY)
Admission: RE | Admit: 2022-11-17 | Discharge: 2022-11-17 | Disposition: A | Discharge status: Home / Self Care | Payer: Medicare Other | Source: Ambulatory Visit | Attending: "Endocrinology | Admitting: "Endocrinology

## 2022-11-17 DIAGNOSIS — E0520 Thyrotoxicosis with toxic multinodular goiter without thyrotoxic crisis or storm: Secondary | ICD-10-CM | POA: Diagnosis not present

## 2022-11-17 DIAGNOSIS — D440 Neoplasm of uncertain behavior of thyroid gland: Secondary | ICD-10-CM | POA: Insufficient documentation

## 2022-11-17 MED ORDER — SODIUM IODIDE I-123 7.4 MBQ CAPS
440.0000 | ORAL_CAPSULE | Freq: Once | ORAL | Status: AC
  Administered 2022-11-17: 440 via ORAL

## 2022-11-18 ENCOUNTER — Encounter (HOSPITAL_COMMUNITY)
Admission: RE | Admit: 2022-11-18 | Discharge: 2022-11-18 | Disposition: A | Discharge status: Home / Self Care | Payer: Medicare Other | Source: Ambulatory Visit | Attending: "Endocrinology | Admitting: "Endocrinology

## 2022-11-18 DIAGNOSIS — E041 Nontoxic single thyroid nodule: Secondary | ICD-10-CM | POA: Diagnosis not present

## 2022-11-18 DIAGNOSIS — D351 Benign neoplasm of parathyroid gland: Secondary | ICD-10-CM | POA: Diagnosis not present

## 2022-11-23 ENCOUNTER — Encounter: Payer: Self-pay | Admitting: "Endocrinology

## 2022-11-25 ENCOUNTER — Encounter: Payer: Self-pay | Admitting: "Endocrinology

## 2022-11-25 ENCOUNTER — Ambulatory Visit: Payer: Medicare Other | Admitting: "Endocrinology

## 2022-11-25 VITALS — BP 108/66 | HR 88 | Ht 64.0 in | Wt 131.6 lb

## 2022-11-25 DIAGNOSIS — E0520 Thyrotoxicosis with toxic multinodular goiter without thyrotoxic crisis or storm: Secondary | ICD-10-CM

## 2022-11-25 NOTE — Progress Notes (Signed)
11/25/2022, 6:33 PM                        Endocrinology follow-up note  Subjective:    Patient ID: Nicole Ewing, female    DOB: 1961/03/16, PCP [place] Nation, MD   Past Medical History:  Diagnosis Date   Acid reflux    Anemia    Anxiety    Arthritis    Cervical cancer ([place])    COPD (chronic obstructive pulmonary disease) ([place])    Depression    H/O: hysterectomy    Cervical dysplasia   High cholesterol    Hyperthyroidism    Menopause ovarian failure    Migraines    Osteoporosis    Past Surgical History:  Procedure Laterality Date   ABDOMINAL HYSTERECTOMY     BIOPSY  03/19/2021   Procedure: BIOPSY;  Surgeon: Daneil Dolin, MD;  [facility];  Service: Endoscopy;;   BIOPSY  10/11/2022   Procedure: BIOPSY;  Surgeon: Daneil Dolin, MD;  [facility];  Service: Endoscopy;;   BREAST SURGERY Right    breast buiopsy   COLONOSCOPY  04/06/2007   [facility]:  Normal rectum, no inflammation, no colonic or rectal polyps, tortuous and redundant colon. Recommendations to repeat TCS in 5 years (2013).    COLONOSCOPY WITH PROPOFOL N/A 02/07/2020   Procedure: COLONOSCOPY WITH PROPOFOL;  Surgeon: Daneil Dolin, MD;  1 hyperplastic polyp. Otherwise normal exam. Recommended repeat in 5 years.    COLONOSCOPY WITH PROPOFOL N/A 10/11/2022   Procedure: COLONOSCOPY WITH PROPOFOL;  Surgeon: Daneil Dolin, MD;  [facility];  Service: Endoscopy;  Laterality: N/A;  1:30 pm   ESOPHAGOGASTRODUODENOSCOPY (EGD) WITH PROPOFOL N/A 03/19/2021   Surgeon: Daneil Dolin, MD;  Normal esophagus s/p dilation and biopsy, patchy erythema of gastric mucosa particularly the body with a polypoid appearance s/p biopsy, normal examined duodenum.  Pathology with chronic mild gastritis, negative for H. pylori.  Esophageal biopsy benign.   ESOPHAGOGASTRODUODENOSCOPY (EGD) WITH PROPOFOL N/A 10/11/2022   Procedure:  ESOPHAGOGASTRODUODENOSCOPY (EGD) WITH PROPOFOL;  Surgeon: Daneil Dolin, MD;  [facility];  Service: Endoscopy;  Laterality: N/A;   MALONEY DILATION N/A 03/19/2021   Procedure: Venia Minks DILATION;  Surgeon: Daneil Dolin, MD;  [facility];  Service: Endoscopy;  Laterality: N/A;   POLYPECTOMY  02/07/2020   Procedure: POLYPECTOMY;  Surgeon: Daneil Dolin, MD;  [facility];  Service: Endoscopy;;   TONSILLECTOMY     TOTAL HIP ARTHROPLASTY Left 11/16/2018   TOTAL HIP ARTHROPLASTY Right 07/02/2019   Social History   Socioeconomic History   Marital status: Married    Spouse name: Not on file   Number of children: Not on file   Years of education: Not on file   Highest education level: Not on file  Occupational History   Not on file  Tobacco Use   Smoking status: Former    Packs/day: 0.25    Years: 25.00    Total pack years: 6.25    Types: Cigars, Cigarettes    Quit date: 05/2022    Years since quitting: 0.5   Smokeless tobacco:  Former   Tobacco comments:    1-2 cigars daily  Vaping Use   Vaping Use: Some days  Substance and Sexual Activity   Alcohol use: Not Currently    Comment: 4-6 beers daily (decresed to 2 every other day -08/15/2020); quit January 2022   Drug use: Not Currently   Sexual activity: Yes    Birth control/protection: Surgical  Other Topics Concern   Not on file  Social History Narrative   Not on file   Social Determinants of Health   Financial Resource Strain: Not on file  Food Insecurity: Not on file  Transportation Needs: Not on file  Physical Activity: Not on file  Stress: Not on file  Social Connections: Not on file   Family History  Problem Relation Age of Onset   Melanoma Father    Berenice Primas' disease Child    Colon polyps Brother    Colon cancer Neg Hx    Outpatient Encounter Medications as of 11/25/2022  Medication Sig   benzocaine (AMERICAINE) 20 % rectal ointment Place rectally every 3 (three) hours as  needed for pain.   Blood Glucose Monitoring Suppl (ACCU-CHEK GUIDE ME) w/Device KIT 1 Piece by Does not apply route as directed.   Calcium Carbonate-Vitamin D (CALCIUM 600+D PO) Take 2 tablets by mouth daily. 1274m calcium, 10035mvit D   diazepam (VALIUM) 5 MG tablet Take 5 mg by mouth in the morning and at bedtime.   docusate sodium (COLACE) 100 MG capsule Take 100 mg by mouth daily.   glucose blood (ACCU-CHEK GUIDE) test strip USE ONLY WHEN YOU GET SYMPTOMS OF LOW GLUCOSE   HYDROcodone-acetaminophen (NORCO/VICODIN) 5-325 MG tablet Take 1 tablet by mouth every 8 (eight) hours as needed for severe pain.   Lancets MISC 1 each by Does not apply route as directed.   loratadine (CLARITIN) 10 MG tablet Take 10 mg by mouth daily.   methocarbamol (ROBAXIN) 750 MG tablet Take 750 mg by mouth 4 (four) times daily as needed for muscle spasms.   Multiple Vitamin (MULTIVITAMIN WITH MINERALS) TABS tablet Take 1 tablet by mouth daily.   pantoprazole (PROTONIX) 40 MG tablet Take 1 tablet (40 mg total) by mouth 2 (two) times daily.   polyethylene glycol (MIRALAX / GLYCOLAX) 17 g packet Take 17 g by mouth daily as needed for mild constipation.   Probiotic Product (PROBIOTIC DAILY PO) Take 1 capsule by mouth daily.   vitamin B-12 (CYANOCOBALAMIN) 250 MCG tablet Take 250 mcg by mouth daily.   Zoledronic Acid (RECLAST IV) Inject into the vein. Once a year. Last had 10/07/21   [DISCONTINUED] hydrocortisone (ANUSOL-HC) 25 MG suppository Place 1 suppository (25 mg total) rectally 2 (two) times daily.   No facility-administered encounter medications on file as of 11/25/2022.   ALLERGIES: Allergies  Allergen Reactions   Codeine Rash, Hives and Nausea And Vomiting   Orphenadrine Nausea And Vomiting   Keflex [Cephalexin] Rash   Tramadol Nausea And Vomiting and Nausea Only    Sick on stomach     VACCINATION STATUS: Immunization History  Administered Date(s) Administered   Hepatitis B, adult 09/06/2019,  10/11/2019, 04/03/2020   PNEUMOCOCCAL CONJUGATE-20 05/25/2022   Td (Adult), 2 Lf Tetanus Toxid, Preservative Free 09/27/1995   Tdap 03/19/2015   Zoster Recombinat (Shingrix) 11/16/2021, 01/19/2022    HPI Nicole Ewing [age].o. female who is returning with repeat thyroid function test after she was seen in consultation for hyperthyroidism.    PMD: [place] NationMD. She  was diagnosed with hyperthyroidism in May 2020 when she was previously briefly treated with methimazole with adequate treatment response.  She is currently not on any antithyroid intervention.  Her previsit thyroid function test consistent with hyperthyroidism from left-sided toxic adenoma on her thyroid.  This was seen on her thyroid uptake and scan.    She is known to have chronic/subacute hyponatremia, this has resolved.  -Her prior thyroid uptake and scan showed 24-hour uptake of 25.6% in November 2020.  Her most recent thyroid uptake and scan on November 18, 2022 showed 18.7% 24-hour uptake and scan however with hyperfunctioning adenoma at the inferior left thyroid lobe.    She previously underwent went fine-needle aspiration of a cold nodule in the thyroid with benign outcomes. Currently, she denies palpitations, heat intolerance, tremors.  She does have family history of thyroid dysfunction in her daughter.  She has steady weight.  She denies dysphagia, shortness of breath, nor voice change. She admits to upto 6 beers daily.  Review of Systems Limited as above.  Objective:    BP 108/66   Pulse 88   Ht _0  (1.626 m)   Wt 131 lb 9.6 oz (59.7 kg)   BMI 22.59 kg/m   Wt Readings from Last 3 Encounters:  11/25/22 131 lb 9.6 oz (59.7 kg)  10/11/22 132 lb (59.9 kg)  10/05/22 132 lb (59.9 kg)    Physical Exam  CMP ( most recent) CMP     Component Value Date/Time   NA 139 08/18/2022 0000   K 4.9 08/18/2022 0000   CL 106 08/18/2022 0000   CO2 24 (A) 08/18/2022 0000   GLUCOSE 102 (H) 02/24/2022  1121   BUN 8 08/18/2022 0000   CREATININE 0.7 08/18/2022 0000   CREATININE 0.69 02/24/2022 1121   CREATININE 0.43 (L) 06/05/2019 1446   CALCIUM 9.4 08/18/2022 0000   PROT 6.8 08/10/2022 1349   ALBUMIN 4.1 08/18/2022 0000   AST 21 08/10/2022 1349   ALT 23 08/18/2022 0000   ALKPHOS 47 08/18/2022 0000   BILITOT 0.5 08/10/2022 1349   GFRNONAA >60 02/24/2022 1121   GFRNONAA 112 06/05/2019 1446   GFRAA >60 12/04/2019 1353   GFRAA 130 06/05/2019 1446    Lipid Panel     Component Value Date/Time   CHOL 242 (A) 08/18/2022 0000   TRIG 62 08/18/2022 0000   HDL 107 (A) 08/18/2022 0000   LDLCALC 123 08/18/2022 0000      Lab Results  Component Value Date   TSH 0.21 (A) 08/18/2022   TSH 0.597 02/24/2022   TSH 0.699 08/26/2021   TSH 0.593 02/23/2021   TSH 1.110 08/28/2020   TSH 0.543 02/25/2020   TSH 0.95 09/26/2019   TSH 0.01 (A) 04/26/2019   TSH 0.01 (A) 04/26/2019   FREET4 0.88 02/24/2022   FREET4 0.78 08/26/2021   FREET4 0.86 02/23/2021   FREET4 0.75 08/28/2020   FREET4 0.69 02/25/2020   FREET4 0.7 (L) 09/26/2019      Assessment & Plan:   1. Hyperthyroidism-from hyperfunctioning adenoma at the inferior left thyroid lobe    2. Hyponatremia-resolved Her previsit thyroid function tests are consistent with hyperthyroidism from toxic adenoma on the left lobe of her thyroid.  Options of treatment were discussed with her.  She was briefly treated with low-dose methimazole in the past.  Her best option is radioactive iodine thyroid ablation which was discussed in detail with her.  She agrees with plan and this treatment will be scheduled to be administered  at [facility] in the next several days.  She is made aware of the subsequent RAI induced hypothyroidism which will need thyroid hormone replacement.   -Regarding her hyponatremia: This has resolved after she was advised on free water restrictions.    -She has a negative fine-needle aspiration of a thyroid nodule.   She  was counseled on smoking cessation last visit, presents with successful quitting. She has high risk for relapse.    -Her recent labs showed correction of her previously documented hyper transaminases.   - I advised her  to maintain close follow up with [employer] Nation, MD for primary care needs.   I spent 21 minutes in the care of the patient today including review of labs from Thyroid Function, CMP, and other relevant labs ; imaging/biopsy records (current and previous including abstractions from other facilities); face-to-face time discussing  her lab results and symptoms, medications doses, her options of short and long term treatment based on the latest standards of care / guidelines;   and documenting the encounter.  Salomon Mast Hijazi  participated in the discussions, expressed understanding, and voiced agreement with the above plans.  All questions were answered to her satisfaction. she is encouraged to contact clinic should she have any questions or concerns prior to her return visit.   Follow up plan: Return in about 9 weeks (around 01/27/2023) for F/U with Labs after I131 Therapy.   Glade Lloyd, MD [facility] [address] [place], [place] [address] Phone: [phone]  Fax: [phone]     11/25/2022, 6:33 PM  This note was partially dictated with voice recognition software. Similar sounding words can be transcribed inadequately or may not  be corrected upon review.

## 2022-12-02 DIAGNOSIS — K760 Fatty (change of) liver, not elsewhere classified: Secondary | ICD-10-CM | POA: Diagnosis not present

## 2022-12-02 DIAGNOSIS — G8929 Other chronic pain: Secondary | ICD-10-CM | POA: Diagnosis not present

## 2022-12-02 DIAGNOSIS — F17200 Nicotine dependence, unspecified, uncomplicated: Secondary | ICD-10-CM | POA: Diagnosis not present

## 2022-12-02 DIAGNOSIS — M810 Age-related osteoporosis without current pathological fracture: Secondary | ICD-10-CM | POA: Diagnosis not present

## 2022-12-02 DIAGNOSIS — D649 Anemia, unspecified: Secondary | ICD-10-CM | POA: Diagnosis not present

## 2022-12-02 DIAGNOSIS — R030 Elevated blood-pressure reading, without diagnosis of hypertension: Secondary | ICD-10-CM | POA: Diagnosis not present

## 2022-12-02 DIAGNOSIS — R1310 Dysphagia, unspecified: Secondary | ICD-10-CM | POA: Diagnosis not present

## 2022-12-02 DIAGNOSIS — Z6822 Body mass index (BMI) 22.0-22.9, adult: Secondary | ICD-10-CM | POA: Diagnosis not present

## 2022-12-10 NOTE — Written Directive (Addendum)
MOLECULAR IMAGING AND THERAPEUTICS WRITTEN DIRECTIVE   PATIENT NAME: Nicole Ewing  PT DOB:   May 30, 1961                                              MRN: [id number]  ---------------------------------------------------------------------------------------------------------------------   I-131 WHOLE THYROID THERAPY (NON-CANCER)    RADIOPHARMACEUTICAL:   Iodine-131 Capsule    PRESCRIBED DOSE FOR ADMINISTRATION: 30 mCi  (thirty milliCuries)   ROUTE OFADMINISTRATION: PO   DIAGNOSIS:  Hyperfunctional adenoma at inferior LEFT thyroid lobe.   REFERRING PHYSICIAN: Dr. Dorris Fetch   TSH:    Lab Results  Component Value Date   TSH 0.21 (A) 08/18/2022   TSH 0.597 02/24/2022   TSH 0.699 08/26/2021     PRIOR I-131 THERAPY (Date and Dose): N/A   PRIOR RADIOLOGY EXAMS (Results and Date): NM THYROID MULT UPTAKE W/IMAGING  Result Date: 11/18/2022 CLINICAL DATA:  Suppressed TSH EXAM: THYROID SCAN AND UPTAKE - 4 AND 24 HOURS TECHNIQUE: Following oral administration of I-123 capsule, anterior planar imaging was acquired at 24 hours. Thyroid uptake was calculated with a thyroid probe at 4-6 hours and 24 hours. RADIOPHARMACEUTICALS:  Four hundred 55 uCi I-123 sodium iodide p.o. COMPARISON:  Thyroid ultrasound 10/31/2019 FINDINGS: Hot nodule at inferior pole LEFT thyroid lobe. Suppression of uptake in remaining thyroid tissue. 4 hour I-123 uptake = 9.4% (normal 5-20%) 24 hour I-123 uptake = 18.7% (normal 10-30%) IMPRESSION: Hyperfunctional adenoma at inferior LEFT thyroid lobe. Electronically Signed   By: Lavonia Dana M.D.   On: 11/18/2022 12:55      ADDITIONAL PHYSICIAN COMMENTS/NOTES   AUTHORIZED USER SIGNATURE & TIME STAMP:

## 2022-12-13 ENCOUNTER — Ambulatory Visit: Payer: Medicare Other | Admitting: Dermatology

## 2022-12-14 ENCOUNTER — Encounter (HOSPITAL_COMMUNITY): Payer: Self-pay

## 2022-12-14 ENCOUNTER — Encounter: Payer: Self-pay | Admitting: Gastroenterology

## 2022-12-14 ENCOUNTER — Encounter (HOSPITAL_COMMUNITY)
Admission: RE | Admit: 2022-12-14 | Discharge: 2022-12-14 | Disposition: A | Discharge status: Home / Self Care | Payer: Medicare Other | Source: Ambulatory Visit | Attending: "Endocrinology | Admitting: "Endocrinology

## 2022-12-14 DIAGNOSIS — E0520 Thyrotoxicosis with toxic multinodular goiter without thyrotoxic crisis or storm: Secondary | ICD-10-CM | POA: Insufficient documentation

## 2022-12-14 DIAGNOSIS — E0590 Thyrotoxicosis, unspecified without thyrotoxic crisis or storm: Secondary | ICD-10-CM | POA: Diagnosis not present

## 2022-12-14 DIAGNOSIS — D649 Anemia, unspecified: Secondary | ICD-10-CM | POA: Diagnosis not present

## 2022-12-14 MED ORDER — SODIUM IODIDE I 131 CAPSULE
30.0000 | Freq: Once | INTRAVENOUS | Status: AC | PRN
  Administered 2022-12-14: 31.7 via ORAL

## 2022-12-21 DIAGNOSIS — M25561 Pain in right knee: Secondary | ICD-10-CM | POA: Diagnosis not present

## 2022-12-21 DIAGNOSIS — G8929 Other chronic pain: Secondary | ICD-10-CM | POA: Diagnosis not present

## 2022-12-21 DIAGNOSIS — Z79891 Long term (current) use of opiate analgesic: Secondary | ICD-10-CM | POA: Diagnosis not present

## 2022-12-21 DIAGNOSIS — M47816 Spondylosis without myelopathy or radiculopathy, lumbar region: Secondary | ICD-10-CM | POA: Diagnosis not present

## 2022-12-21 DIAGNOSIS — M25562 Pain in left knee: Secondary | ICD-10-CM | POA: Diagnosis not present

## 2022-12-21 DIAGNOSIS — G894 Chronic pain syndrome: Secondary | ICD-10-CM | POA: Diagnosis not present

## 2022-12-22 DIAGNOSIS — D500 Iron deficiency anemia secondary to blood loss (chronic): Secondary | ICD-10-CM | POA: Diagnosis not present

## 2022-12-22 DIAGNOSIS — D638 Anemia in other chronic diseases classified elsewhere: Secondary | ICD-10-CM | POA: Diagnosis not present

## 2022-12-24 DIAGNOSIS — M25561 Pain in right knee: Secondary | ICD-10-CM | POA: Diagnosis not present

## 2022-12-24 DIAGNOSIS — M25562 Pain in left knee: Secondary | ICD-10-CM | POA: Diagnosis not present

## 2023-01-11 ENCOUNTER — Telehealth: Payer: Self-pay | Admitting: Internal Medicine

## 2023-01-11 NOTE — Telephone Encounter (Signed)
Spoke to pt, informed of recommendations. Pt voiced understanding.

## 2023-01-11 NOTE — Telephone Encounter (Signed)
Patient sent a MyChart message about scheduling a later time for her PP FU OV on 01/21/2023. I messaged her back to let her know that I could reschedule her to 01/27/2023 at 130pm and to let us know ASAP if that day would work for her instead of the 16th. She wants to know if she needs labs prior to her OV. Please advise. [phone]

## 2023-01-11 NOTE — Telephone Encounter (Signed)
Recent labs on file 12/22/22. No labs needed prior to OV.

## 2023-01-18 ENCOUNTER — Other Ambulatory Visit (HOSPITAL_COMMUNITY)
Admission: RE | Admit: 2023-01-18 | Discharge: 2023-01-18 | Disposition: A | Discharge status: Home / Self Care | Payer: Medicare Other | Source: Ambulatory Visit | Attending: "Endocrinology | Admitting: "Endocrinology

## 2023-01-18 DIAGNOSIS — E0520 Thyrotoxicosis with toxic multinodular goiter without thyrotoxic crisis or storm: Secondary | ICD-10-CM | POA: Insufficient documentation

## 2023-01-18 LAB — COMPREHENSIVE METABOLIC PANEL
ALT: 20 U/L (ref 0–44)
AST: 22 U/L (ref 15–41)
Albumin: 4 g/dL (ref 3.5–5.0)
Alkaline Phosphatase: 45 U/L (ref 38–126)
Anion gap: 6 (ref 5–15)
BUN: 11 mg/dL (ref 8–23)
CO2: 26 mmol/L (ref 22–32)
Calcium: 9.2 mg/dL (ref 8.9–10.3)
Chloride: 101 mmol/L (ref 98–111)
Creatinine, Ser: 0.53 mg/dL (ref 0.44–1.00)
GFR, Estimated: >60 mL/min (ref >60)
Glucose, Bld: 127 mg/dL — ABNORMAL HIGH (ref 70–99)
Potassium: 3.7 mmol/L (ref 3.5–5.1)
Sodium: 133 mmol/L — ABNORMAL LOW (ref 135–145)
Total Bilirubin: 0.3 mg/dL (ref 0.3–1.2)
Total Protein: 7 g/dL (ref 6.5–8.1)

## 2023-01-18 LAB — TSH: TSH: <0.01 u[IU]/mL — ABNORMAL LOW (ref 0.350–4.500)

## 2023-01-18 LAB — T4, FREE: Free T4: 1.84 ng/dL — ABNORMAL HIGH (ref 0.61–1.12)

## 2023-01-19 LAB — T3, FREE: T3, Free: 5.2 pg/mL — ABNORMAL HIGH (ref 2.0–4.4)

## 2023-01-21 ENCOUNTER — Ambulatory Visit: Payer: Medicare Other | Admitting: Gastroenterology

## 2023-01-25 ENCOUNTER — Ambulatory Visit: Payer: Medicare Other | Admitting: "Endocrinology

## 2023-01-25 ENCOUNTER — Encounter: Payer: Self-pay | Admitting: "Endocrinology

## 2023-01-25 VITALS — BP 102/74 | HR 84 | Ht 64.0 in | Wt 129.4 lb

## 2023-01-25 DIAGNOSIS — E0520 Thyrotoxicosis with toxic multinodular goiter without thyrotoxic crisis or storm: Secondary | ICD-10-CM | POA: Diagnosis not present

## 2023-01-25 DIAGNOSIS — E871 Hypo-osmolality and hyponatremia: Secondary | ICD-10-CM | POA: Diagnosis not present

## 2023-01-25 NOTE — Progress Notes (Signed)
01/25/2023, 5:35 PM                        Endocrinology follow-up note  Subjective:    Patient ID: Nicole Ewing, female    DOB: 07/31/1961, PCP Burke Nation, MD   Past Medical History:  Diagnosis Date   Acid reflux    Anemia    Anxiety    Arthritis    Cervical cancer ([place])    COPD (chronic obstructive pulmonary disease) ([place])    Depression    H/O: hysterectomy    Cervical dysplasia   High cholesterol    Hyperthyroidism    Menopause ovarian failure    Migraines    Osteoporosis    Past Surgical History:  Procedure Laterality Date   ABDOMINAL HYSTERECTOMY     BIOPSY  03/19/2021   Procedure: BIOPSY;  Surgeon: Daneil Dolin, MD;  [facility];  Service: Endoscopy;;   BIOPSY  10/11/2022   Procedure: BIOPSY;  Surgeon: Daneil Dolin, MD;  [facility];  Service: Endoscopy;;   BREAST SURGERY Right    breast buiopsy   COLONOSCOPY  04/06/2007   [facility]:  Normal rectum, no inflammation, no colonic or rectal polyps, tortuous and redundant colon. Recommendations to repeat TCS in 5 years (2013).    COLONOSCOPY WITH PROPOFOL N/A 02/07/2020   Procedure: COLONOSCOPY WITH PROPOFOL;  Surgeon: Daneil Dolin, MD;  1 hyperplastic polyp. Otherwise normal exam. Recommended repeat in 5 years.    COLONOSCOPY WITH PROPOFOL N/A 10/11/2022   Procedure: COLONOSCOPY WITH PROPOFOL;  Surgeon: Daneil Dolin, MD;  [facility];  Service: Endoscopy;  Laterality: N/A;  1:30 pm   ESOPHAGOGASTRODUODENOSCOPY (EGD) WITH PROPOFOL N/A 03/19/2021   Surgeon: Daneil Dolin, MD;  Normal esophagus s/p dilation and biopsy, patchy erythema of gastric mucosa particularly the body with a polypoid appearance s/p biopsy, normal examined duodenum.  Pathology with chronic mild gastritis, negative for H. pylori.  Esophageal biopsy benign.   ESOPHAGOGASTRODUODENOSCOPY (EGD) WITH PROPOFOL N/A 10/11/2022   Procedure:  ESOPHAGOGASTRODUODENOSCOPY (EGD) WITH PROPOFOL;  Surgeon: Daneil Dolin, MD;  [facility];  Service: Endoscopy;  Laterality: N/A;   MALONEY DILATION N/A 03/19/2021   Procedure: Venia Minks DILATION;  Surgeon: Daneil Dolin, MD;  [facility];  Service: Endoscopy;  Laterality: N/A;   POLYPECTOMY  02/07/2020   Procedure: POLYPECTOMY;  Surgeon: Daneil Dolin, MD;  [facility];  Service: Endoscopy;;   TONSILLECTOMY     TOTAL HIP ARTHROPLASTY Left 11/16/2018   TOTAL HIP ARTHROPLASTY Right 07/02/2019   Social History   Socioeconomic History   Marital status: Married    Spouse name: Not on file   Number of children: Not on file   Years of education: Not on file   Highest education level: Not on file  Occupational History   Not on file  Tobacco Use   Smoking status: Former    Packs/day: 0.25    Years: 25.00    Total pack years: 6.25    Types: Cigars, Cigarettes    Quit date: 05/2022    Years since quitting: 0.7   Smokeless tobacco:  Former   Tobacco comments:    1-2 cigars daily  Vaping Use   Vaping Use: Some days  Substance and Sexual Activity   Alcohol use: Not Currently    Comment: 4-6 beers daily (decresed to 2 every other day -08/15/2020); quit January 2022   Drug use: Not Currently   Sexual activity: Yes    Birth control/protection: Surgical  Other Topics Concern   Not on file  Social History Narrative   Not on file   Social Determinants of Health   Financial Resource Strain: Not on file  Food Insecurity: Not on file  Transportation Needs: Not on file  Physical Activity: Not on file  Stress: Not on file  Social Connections: Not on file   Family History  Problem Relation Age of Onset   Melanoma Father    Berenice Primas' disease Child    Colon polyps Brother    Colon cancer Neg Hx    Outpatient Encounter Medications as of 01/25/2023  Medication Sig   oxyCODONE (OXY IR/ROXICODONE) 5 MG immediate release tablet Take 5 mg by mouth daily  as needed.   benzocaine (AMERICAINE) 20 % rectal ointment Place rectally every 3 (three) hours as needed for pain.   Blood Glucose Monitoring Suppl (ACCU-CHEK GUIDE ME) w/Device KIT 1 Piece by Does not apply route as directed.   Calcium Carbonate-Vitamin D (CALCIUM 600+D PO) Take 2 tablets by mouth daily. 1249m calcium, 10032mvit D   diazepam (VALIUM) 5 MG tablet Take 5 mg by mouth in the morning and at bedtime.   docusate sodium (COLACE) 100 MG capsule Take 100 mg by mouth daily.   glucose blood (ACCU-CHEK GUIDE) test strip USE ONLY WHEN YOU GET SYMPTOMS OF LOW GLUCOSE   Lancets MISC 1 each by Does not apply route as directed.   loratadine (CLARITIN) 10 MG tablet Take 10 mg by mouth daily.   methocarbamol (ROBAXIN) 750 MG tablet Take 750 mg by mouth 4 (four) times daily as needed for muscle spasms.   Multiple Vitamin (MULTIVITAMIN WITH MINERALS) TABS tablet Take 1 tablet by mouth daily.   pantoprazole (PROTONIX) 40 MG tablet Take 1 tablet (40 mg total) by mouth 2 (two) times daily.   polyethylene glycol (MIRALAX / GLYCOLAX) 17 g packet Take 17 g by mouth daily as needed for mild constipation.   Probiotic Product (PROBIOTIC DAILY PO) Take 1 capsule by mouth daily.   vitamin B-12 (CYANOCOBALAMIN) 250 MCG tablet Take 250 mcg by mouth daily.   Zoledronic Acid (RECLAST IV) Inject into the vein. Once a year. Last had 10/07/21   [DISCONTINUED] HYDROcodone-acetaminophen (NORCO/VICODIN) 5-325 MG tablet Take 1 tablet by mouth every 8 (eight) hours as needed for severe pain. (Patient not taking: Reported on 01/25/2023)   No facility-administered encounter medications on file as of 01/25/2023.   ALLERGIES: Allergies  Allergen Reactions   Codeine Rash, Hives and Nausea And Vomiting   Orphenadrine Nausea And Vomiting   Keflex [Cephalexin] Rash   Tramadol Nausea And Vomiting and Nausea Only    Sick on stomach     VACCINATION STATUS: Immunization History  Administered Date(s) Administered    Covid-19, Mrna,Vaccine(Spikevax)127yrnd older 09/11/2022   Hepatitis B, ADULT 09/06/2019, 10/11/2019, 04/03/2020   PNEUMOCOCCAL CONJUGATE-20 05/25/2022   Td (Adult), 2 Lf Tetanus Toxid, Preservative Free 09/27/1995   Tdap 03/19/2015   Zoster Recombinat (Shingrix) 11/16/2021, 01/19/2022    HPI Nicole Ewing [age]o. female who is returning with repeat thyroid function test after she was seen  in consultation for hyperthyroidism.    PMD: Rankin Nation, MD. She was diagnosed with hyperthyroidism in May 2020 when she was previously briefly treated with methimazole with adequate treatment response.  She is currently not on any antithyroid intervention.  Her previsit thyroid function test consistent with hyperthyroidism from left-sided toxic adenoma on her thyroid.  This was seen on her thyroid uptake and scan.    She is known to have chronic/subacute hyponatremia, this has resolved.  -Her prior thyroid uptake and scan showed 24-hour uptake of 25.6% in November 2020.  Her most recent thyroid uptake and scan on November 18, 2022 showed 18.7% 24-hour uptake and scan however with hyperfunctioning adenoma at the inferior left thyroid lobe.    She previously underwent went fine-needle aspiration of a cold nodule in the thyroid with benign outcomes. She was offered radioactive iodine ablation which she was administered on December 14, 2022. Her thyroid function tests on January 18, 2023 still showed thyrotoxicosis, did not develop hypothyroidism. She presents with intermittent palpitations, heat intolerance, tremors.  She does have family history of thyroid dysfunction in her daughter.  She has steady weight.  She denies dysphagia, shortness of breath, nor voice change. She admits to upto 6 beers daily.  Review of Systems Limited as above.  Objective:    BP 102/74   Pulse 84   Ht 5' 4"$  (1.626 m)   Wt 129 lb 6.4 oz (58.7 kg)   BMI 22.21 kg/m   Wt Readings from Last 3 Encounters:   01/25/23 129 lb 6.4 oz (58.7 kg)  11/25/22 131 lb 9.6 oz (59.7 kg)  10/11/22 132 lb (59.9 kg)    Physical Exam  CMP ( most recent) CMP     Component Value Date/Time   NA 133 (L) 01/18/2023 1307   NA 139 08/18/2022 0000   K 3.7 01/18/2023 1307   CL 101 01/18/2023 1307   CO2 26 01/18/2023 1307   GLUCOSE 127 (H) 01/18/2023 1307   BUN 11 01/18/2023 1307   BUN 8 08/18/2022 0000   CREATININE 0.53 01/18/2023 1307   CREATININE 0.43 (L) 06/05/2019 1446   CALCIUM 9.2 01/18/2023 1307   PROT 7.0 01/18/2023 1307   ALBUMIN 4.0 01/18/2023 1307   AST 22 01/18/2023 1307   ALT 20 01/18/2023 1307   ALKPHOS 45 01/18/2023 1307   BILITOT 0.3 01/18/2023 1307   GFRNONAA >60 01/18/2023 1307   GFRNONAA 112 06/05/2019 1446   GFRAA >60 12/04/2019 1353   GFRAA 130 06/05/2019 1446    Lipid Panel     Component Value Date/Time   CHOL 242 (A) 08/18/2022 0000   TRIG 62 08/18/2022 0000   HDL 107 (A) 08/18/2022 0000   LDLCALC 123 08/18/2022 0000      Lab Results  Component Value Date   TSH <0.010 (L) 01/18/2023   TSH 0.21 (A) 08/18/2022   TSH 0.597 02/24/2022   TSH 0.699 08/26/2021   TSH 0.593 02/23/2021   TSH 1.110 08/28/2020   TSH 0.543 02/25/2020   TSH 0.95 09/26/2019   TSH 0.01 (A) 04/26/2019   TSH 0.01 (A) 04/26/2019   FREET4 1.84 (H) 01/18/2023   FREET4 0.88 02/24/2022   FREET4 0.78 08/26/2021   FREET4 0.86 02/23/2021   FREET4 0.75 08/28/2020   FREET4 0.69 02/25/2020   FREET4 0.7 (L) 09/26/2019      Assessment & Plan:   1. Hyperthyroidism-from hyperfunctioning adenoma at the inferior left thyroid lobe     She is a status post radioactive  iodine to ablate toxic thyroid adenoma in the left lobe of her thyroid.  She did not develop clinical hypothyroidism, she could be a late responder..   See notes from previous visits.    She will return in 5 weeks with repeat thyroid function tests.    -She has a negative fine-needle aspiration of a thyroid nodule.   She was counseled  on smoking cessation last visit, presents with successful quitting. She has high risk for relapse.  -Her recent labs showed correction of her previously documented hyper transaminases.   - I advised her  to maintain close follow up with [place] Nation, MD for primary care needs.  I spent  21  minutes in the care of the patient today including review of labs from Thyroid Function, CMP, and other relevant labs ; imaging/biopsy records (current and previous including abstractions from other facilities); face-to-face time discussing  her lab results and symptoms, medications doses, her options of short and long term treatment based on the latest standards of care / guidelines;   and documenting the encounter.  Salomon Mast Rathmann  participated in the discussions, expressed understanding, and voiced agreement with the above plans.  All questions were answered to her satisfaction. she is encouraged to contact clinic should she have any questions or concerns prior to her return visit.   Follow up plan: Return in about 5 weeks (around 03/01/2023) for F/U with Pre-visit Labs.   Glade Lloyd, MD [facility] [address] [place], [place] [address] Phone: [phone]  Fax: [phone]     01/25/2023, 5:35 PM  This note was partially dictated with voice recognition software. Similar sounding words can be transcribed inadequately or may not  be corrected upon review.

## 2023-01-26 NOTE — Progress Notes (Signed)
Referring Provider: [place] Nation, MD Primary Care Physician:  [place] Nation, MD Primary GI Physician: Dr. Gala Romney  Chief Complaint  Patient presents with   Follow-up    Still having left side pain.     HPI:   Nicole Ewing is a 62 y.o. female with history of GERD, dysphagia resolved with empiric dilation, elevated LFTs, previously with daily alcohol use (abstinent since January 2022), history of intranasal drug use in her 72s, constipation, unintentional weight loss between 2020-2022, presenting today for follow-up of IDA.   Last seen in our office 09/23/2022.  She had previously had blood work with her PCP 08/18/2022 which showed a hemoglobin of 10.6.  Repeat labs 08/30/2022 with hemoglobin down to 10.0, ferritin 15, iron saturation 11%, iron 42.  She had established with [facility] hematology/oncology in [place] who had recommended IV iron.  She reported feeling tired and weak, intermittent black stools for the last couple of months.  Also daily bright red blood when wiping for the last couple of months.  Bowels moving well with Colace daily and MiraLAX as needed.  Reflux well-controlled on Pepcid daily.  Taking BC powders a couple times a week for headaches.  On rectal exam, she was found to have an external perianal lesion that looks like a chronically irritated external hemorrhoid with some skin breakdown as well as perianal rash suspected to be fungal in etiology.  She was scheduled for an EGD and started on PPI.  Also referred to general surgery for perianal lesion and recommended clotrimazole for perianal rash.  She did see general surgery who recommended conservative management of hemorrhoid with overlying ulceration.  EGD 10/11/2022: Normal esophagus, gastric ulcer with surrounding erosions biopsied, duodenal erosions biopsied.  Suspected ongoing use of BC powders likely contributing factor to IDA.  Gastric biopsy was negative for H. pylori, duodenal biopsy benign.  Colonoscopy  10/11/2022: Hemorrhoids on perianal exam, nonbleeding external and internal hemorrhoids, otherwise normal exam.  Repeat in 10 years.    Today:   IDA/PUD:  Labs 12/22/2022 with hemoglobin improved to 12.8 with normocytic indices.  Iron 95, iron saturation 36%, ferritin 71.4.  Last iron infusion: October.   No brbpr or melena.  Continues to take pantoprazole 40 mg twice daily.  Discontinued BC powders.  Has not taken any NSAIDs.  She does take Tylenol as needed for headaches.  GERD:  Well-controlled on pantoprazole. No dysphagia.   Constipation:  Not adequately managed with Colace twice daily.  Occasionally uses MiraLAX.  Bowels move every couple of days, but she passes small, hard stools, has to sit for a long time.  Interested in trying a prescriptive medication.  Reports she has some mild tenderness in the left upper abdomen.  This is chronic and unchanged.  Not really affected by bowel habits or meals.   Elevated LFTs:  Most recent labs 01/18/2023 with LFTs normal. No alcohol.   Having trouble with her thyroid again. Had iodine ablation, but levels still off.  Has follow-up soon with Dr. Dorris Fetch.  Weight has been stable.  Past Medical History:  Diagnosis Date   Acid reflux    Anemia    Anxiety    Arthritis    Cervical cancer ([facility])    COPD (chronic obstructive pulmonary disease) ([facility])    Depression    H/O: hysterectomy    Cervical dysplasia   High cholesterol    Hyperthyroidism    Menopause ovarian failure    Migraines    Osteoporosis  Past Surgical History:  Procedure Laterality Date   ABDOMINAL HYSTERECTOMY     BIOPSY  03/19/2021   Procedure: BIOPSY;  Surgeon: Daneil Dolin, MD;  [facility];  Service: Endoscopy;;   BIOPSY  10/11/2022   Procedure: BIOPSY;  Surgeon: Daneil Dolin, MD;  [facility];  Service: Endoscopy;;   BREAST SURGERY Right    breast buiopsy   COLONOSCOPY  04/06/2007   [facility]:  Normal rectum, no  inflammation, no colonic or rectal polyps, tortuous and redundant colon. Recommendations to repeat TCS in 5 years (2013).    COLONOSCOPY WITH PROPOFOL N/A 02/07/2020   Procedure: COLONOSCOPY WITH PROPOFOL;  Surgeon: Daneil Dolin, MD;  1 hyperplastic polyp. Otherwise normal exam. Recommended repeat in 5 years.    COLONOSCOPY WITH PROPOFOL N/A 10/11/2022   Surgeon: Daneil Dolin, MD;   Hemorrhoids on perianal exam, nonbleeding external and internal hemorrhoids, otherwise normal exam.  Repeat in 10 years.   ESOPHAGOGASTRODUODENOSCOPY (EGD) WITH PROPOFOL N/A 03/19/2021   Surgeon: Daneil Dolin, MD;  Normal esophagus s/p dilation and biopsy, patchy erythema of gastric mucosa particularly the body with a polypoid appearance s/p biopsy, normal examined duodenum.  Pathology with chronic mild gastritis, negative for H. pylori.  Esophageal biopsy benign.   ESOPHAGOGASTRODUODENOSCOPY (EGD) WITH PROPOFOL N/A 10/11/2022   Surgeon: Daneil Dolin, MD; Normal esophagus, gastric ulcer with surrounding erosions biopsied, duodenal erosions biopsied. Gastric biopsy was negative for H. pylori, duodenal biopsy benign.   MALONEY DILATION N/A 03/19/2021   Procedure: Venia Minks DILATION;  Surgeon: Daneil Dolin, MD;  [facility];  Service: Endoscopy;  Laterality: N/A;   POLYPECTOMY  02/07/2020   Procedure: POLYPECTOMY;  Surgeon: Daneil Dolin, MD;  [facility];  Service: Endoscopy;;   TONSILLECTOMY     TOTAL HIP ARTHROPLASTY Left 11/16/2018   TOTAL HIP ARTHROPLASTY Right 07/02/2019    Current Outpatient Medications  Medication Sig Dispense Refill   Blood Glucose Monitoring Suppl (ACCU-CHEK GUIDE ME) w/Device KIT 1 Piece by Does not apply route as directed. 1 kit 0   Calcium Carbonate-Vitamin D (CALCIUM 600+D PO) Take 2 tablets by mouth daily. 1244m calcium, 10076mvit D     diazepam (VALIUM) 5 MG tablet Take 5 mg by mouth in the morning and at bedtime.     docusate sodium (COLACE)  100 MG capsule Take 100 mg by mouth daily.     glucose blood (ACCU-CHEK GUIDE) test strip USE ONLY WHEN YOU GET SYMPTOMS OF LOW GLUCOSE 50 strip 2   Lancets MISC 1 each by Does not apply route as directed. 100 each 3   loratadine (CLARITIN) 10 MG tablet Take 10 mg by mouth daily.     methocarbamol (ROBAXIN) 750 MG tablet Take 750 mg by mouth 4 (four) times daily as needed for muscle spasms.     Multiple Vitamin (MULTIVITAMIN WITH MINERALS) TABS tablet Take 1 tablet by mouth daily.     oxyCODONE (OXY IR/ROXICODONE) 5 MG immediate release tablet Take 5 mg by mouth daily as needed.     pantoprazole (PROTONIX) 40 MG tablet Take 1 tablet (40 mg total) by mouth 2 (two) times daily. 60 tablet 3   Probiotic Product (PROBIOTIC DAILY PO) Take 1 capsule by mouth daily.     vitamin B-12 (CYANOCOBALAMIN) 250 MCG tablet Take 250 mcg by mouth daily.     Zoledronic Acid (RECLAST IV) Inject into the vein. Once a year. Last had 10/07/21  No current facility-administered medications for this visit.    Allergies as of 01/27/2023 - Review Complete 01/27/2023  Allergen Reaction Noted   Codeine Rash, Hives, and Nausea And Vomiting 01/27/2017   Orphenadrine Nausea And Vomiting 10/07/2021   Other Rash 12/21/2022   Keflex [cephalexin] Rash 01/27/2017   Tramadol Nausea And Vomiting and Nausea Only 09/11/2018    Family History  Problem Relation Age of Onset   Melanoma Father    Graves' disease Child    Colon polyps Brother    Colon cancer Neg Hx     Social History   Socioeconomic History   Marital status: Married    Spouse name: Not on file   Number of children: Not on file   Years of education: Not on file   Highest education level: Not on file  Occupational History   Not on file  Tobacco Use   Smoking status: Former    Packs/day: 0.25    Years: 25.00    Total pack years: 6.25    Types: Cigars, Cigarettes    Quit date: 05/2022    Years since quitting: 0.7   Smokeless tobacco: Former    Tobacco comments:    1-2 cigars daily  Vaping Use   Vaping Use: Some days  Substance and Sexual Activity   Alcohol use: Not Currently    Comment: 4-6 beers daily (decresed to 2 every other day -08/15/2020); quit January 2022   Drug use: Not Currently   Sexual activity: Yes    Birth control/protection: Surgical  Other Topics Concern   Not on file  Social History Narrative   Not on file   Social Determinants of Health   Financial Resource Strain: Not on file  Food Insecurity: Not on file  Transportation Needs: Not on file  Physical Activity: Not on file  Stress: Not on file  Social Connections: Not on file    Review of Systems: Gen: Denies fever, chills, cold or flulike symptoms, presyncope, syncope. CV: Denies chest pain, palpitations. Resp: Denies dyspnea, cough. GI: See HPI Heme: See HPI  Physical Exam: BP 120/76 (BP Location: Right Arm, Patient Position: Sitting, Cuff Size: Normal)   Pulse 76   Temp 98.4 F (36.9 C) (Oral)   Ht 5' 4"$  (1.626 m)   Wt 130 lb 3.2 oz (59.1 kg)   SpO2 97%   BMI 22.35 kg/m  General:   Alert and oriented. No distress noted. Pleasant and cooperative.  Head:  Normocephalic and atraumatic. Eyes:  Conjuctiva clear without scleral icterus. Heart:  S1, S2 present without murmurs appreciated. Lungs:  Clear to auscultation bilaterally. No wheezes, rales, or rhonchi. No distress.  Abdomen:  +BS, soft, non-tender and non-distended. No rebound or guarding. No HSM or masses noted. Msk:  Symmetrical without gross deformities. Normal posture. Extremities:  Without edema. Neurologic:  Alert and  oriented x4 Psych:  Normal mood and affect.    Assessment:  62 year old female with history of GERD, dysphagia resolved with empiric dilation, elevated LFTs, previously with daily alcohol use (abstinent since January 2022), history of intranasal drug use in her 63s, constipation, unintentional weight loss between 2020-2022 likely secondary to  hyperthyroidism, presenting today for follow-up of IDA.  IDA/PUD: Likely secondary to PUD. Hemoglobin declined to 10.0 in September 2023 with ferritin 15, iron saturation 11%, iron 42.  She established with [facility] hematology/oncology in [place] and received IV iron in October 2023.  She reported episodes of black stools along with bright red blood per rectum and underwent  EGD and colonoscopy in November 2023.  She was found to have a gastric ulcer with surrounding erosions as well as duodenal erosions in the setting of BC powders.  Gastric and duodenal biopsies were benign.  Colonoscopy revealed hemorrhoids, otherwise normal with recommendations to repeat in 10 years.  She has been on pantoprazole 40 mg twice daily since her procedures and has discontinued all NSAIDs.  No further BRBPR or melena.  Hemoglobin improved to 12.8 in January with normocytic indices.  Iron also normalized.   She is due for surveillance EGD to document ulcer healing; however, due to hyperthyroidism, toxic multinodular goiter, we are going to hold off on scheduling her surveillance EGD until thyroid function has stabilized.  She recently underwent reactive iodine ablation and has follow-up with Dr. Dorris Fetch in March.  GERD: Well-controlled on pantoprazole 40 mg twice daily.  Constipation: Chronic.  Likely opioid induced.  Not adequately managed with Colace twice daily.  Will try her on Linzess 145 mcg daily.  Samples provided.  Elevated LFTs: Prior LFT elevation likely secondary to daily alcohol use, abstinent since January 2022.  Extensive serologic work-up detailed in HPI revealing positive ANA and elevated AMA, but immunoglobulins within normal limits.  MRI with no biliary abnormalities and unremarkable liver.  AST/ALT normalized in September 2020.  Alk phos normalized in June 2021.  AMA was rechecked in September 2022 and remained elevated, but was down to 146.   Most recent labs completed this month with LFTs normal. As her liver  enzymes have remained normal, we will continue to monitor every 6 months. Case previously discussed with Dr. Gala Romney.      Plan:  Continue pantoprazole 40 mg twice daily. Discussed scheduling surveillance EGD in about 8 weeks to give thyroid time to normalize. Continue to avoid all NSAIDs. Continue to follow closely with hematology for monitoring of hemoglobin and iron. Stop Colace. Start Linzess 145 mcg daily 30 minutes before breakfast.  Samples provided.  Requested progress report in 1-2 weeks. Follow-up in about 8 weeks to discuss scheduling EGD.    Aliene Altes, PA-C [facility] 01/27/2023

## 2023-01-27 ENCOUNTER — Encounter: Payer: Self-pay | Admitting: Gastroenterology

## 2023-01-27 ENCOUNTER — Ambulatory Visit: Payer: Medicare Other | Admitting: Gastroenterology

## 2023-01-27 VITALS — BP 120/76 | HR 76 | Temp 98.4°F | Ht 64.0 in | Wt 130.2 lb

## 2023-01-27 DIAGNOSIS — K219 Gastro-esophageal reflux disease without esophagitis: Secondary | ICD-10-CM | POA: Diagnosis not present

## 2023-01-27 DIAGNOSIS — K5900 Constipation, unspecified: Secondary | ICD-10-CM | POA: Diagnosis not present

## 2023-01-27 DIAGNOSIS — K279 Peptic ulcer, site unspecified, unspecified as acute or chronic, without hemorrhage or perforation: Secondary | ICD-10-CM | POA: Diagnosis not present

## 2023-01-27 DIAGNOSIS — R7989 Other specified abnormal findings of blood chemistry: Secondary | ICD-10-CM

## 2023-01-27 DIAGNOSIS — D509 Iron deficiency anemia, unspecified: Secondary | ICD-10-CM

## 2023-01-27 NOTE — Patient Instructions (Addendum)
Continue Protonix 40 mg twice daily.   We will get your upper endoscopy scheduled after your thyroid function has stabilized.  Keep your upcoming appointment with Dr. Dorris Fetch.  Continue to avoid all NSAID products.  For constipation: Stop Colace. Linzess 145 mcg daily 30 minutes before breakfast.  We are providing you with samples today.  Please call with a progress report in 1-2 weeks.  Continue to follow with hematology for monitoring of your hemoglobin and iron.  We will follow-up with you in about 8 weeks to discuss scheduling your EGD at that time.  Aliene Altes, PA-C [facility]

## 2023-02-04 DIAGNOSIS — H2513 Age-related nuclear cataract, bilateral: Secondary | ICD-10-CM | POA: Diagnosis not present

## 2023-02-04 DIAGNOSIS — E119 Type 2 diabetes mellitus without complications: Secondary | ICD-10-CM | POA: Diagnosis not present

## 2023-02-07 ENCOUNTER — Telehealth: Payer: Self-pay | Admitting: Internal Medicine

## 2023-02-07 NOTE — Telephone Encounter (Signed)
I haven't sent in a prescription for this yet. I had only provided samples. Did she find out how much it would cost her? Not sure if we would be able to help with the cost or if she may qualify for patient assistance. We can give her samples of the higher dose to try to see if this would work any better.

## 2023-02-07 NOTE — Telephone Encounter (Signed)
Pt sent me a MyChart message asking questions about Linzess and generic, and side effects. Please call [phone]

## 2023-02-07 NOTE — Telephone Encounter (Signed)
I made an error in this note. Not side effects, but she was asking about results.

## 2023-02-07 NOTE — Telephone Encounter (Signed)
Spoke to pt, she informed me that Linzess 187mg is not working as well as she thought and it is expensive. She states she is only going a little bit.

## 2023-02-08 ENCOUNTER — Encounter: Payer: Self-pay | Admitting: *Deleted

## 2023-02-08 DIAGNOSIS — R1012 Left upper quadrant pain: Secondary | ICD-10-CM | POA: Diagnosis not present

## 2023-02-08 DIAGNOSIS — K5900 Constipation, unspecified: Secondary | ICD-10-CM | POA: Diagnosis not present

## 2023-02-08 DIAGNOSIS — D500 Iron deficiency anemia secondary to blood loss (chronic): Secondary | ICD-10-CM | POA: Diagnosis not present

## 2023-02-08 NOTE — Telephone Encounter (Signed)
Spoke to pt, she informed me that it would be 47$ a month for the first month, then it would go up after that. I left Linzess 281mg at front desk.

## 2023-02-08 NOTE — Telephone Encounter (Signed)
LMOM for pt to call office  

## 2023-02-08 NOTE — Telephone Encounter (Signed)
Ok. So I assume the pricing will be about the same for both Linzess and Movantik. We will see how she responds to [place]. If she does well with 290 mcg, we should try completing patient assistance.

## 2023-02-08 NOTE — Telephone Encounter (Signed)
Sent My-Chart message

## 2023-02-08 NOTE — Telephone Encounter (Signed)
Checked pt's insurance and there is a quantity limit on Linzess and Movantik. Symproic is not covered

## 2023-02-08 NOTE — Telephone Encounter (Signed)
Can you find out what the preferred medications are for her insurance? Would Movantik or Symproic be covered any better than Linzess?

## 2023-02-09 NOTE — Telephone Encounter (Signed)
Noted  

## 2023-02-09 NOTE — Telephone Encounter (Signed)
Spoke to pt, informed her that samples and pt assistance forms were at the desk.

## 2023-02-15 ENCOUNTER — Other Ambulatory Visit: Payer: Self-pay | Admitting: Internal Medicine

## 2023-02-24 ENCOUNTER — Other Ambulatory Visit (HOSPITAL_COMMUNITY)
Admission: RE | Admit: 2023-02-24 | Discharge: 2023-02-24 | Disposition: A | Discharge status: Home / Self Care | Payer: Medicare Other | Source: Ambulatory Visit | Attending: "Endocrinology | Admitting: "Endocrinology

## 2023-02-24 DIAGNOSIS — E0520 Thyrotoxicosis with toxic multinodular goiter without thyrotoxic crisis or storm: Secondary | ICD-10-CM | POA: Insufficient documentation

## 2023-02-24 LAB — TSH: TSH: 0.594 u[IU]/mL (ref 0.350–4.500)

## 2023-02-24 LAB — T4, FREE: Free T4: 0.87 ng/dL (ref 0.61–1.12)

## 2023-02-26 LAB — T3, FREE: T3, Free: 2 pg/mL (ref 2.0–4.4)

## 2023-02-28 DIAGNOSIS — D500 Iron deficiency anemia secondary to blood loss (chronic): Secondary | ICD-10-CM | POA: Diagnosis not present

## 2023-02-28 DIAGNOSIS — K838 Other specified diseases of biliary tract: Secondary | ICD-10-CM | POA: Diagnosis not present

## 2023-02-28 DIAGNOSIS — K5900 Constipation, unspecified: Secondary | ICD-10-CM | POA: Diagnosis not present

## 2023-02-28 DIAGNOSIS — R1012 Left upper quadrant pain: Secondary | ICD-10-CM | POA: Diagnosis not present

## 2023-03-03 ENCOUNTER — Encounter: Payer: Self-pay | Admitting: "Endocrinology

## 2023-03-03 ENCOUNTER — Ambulatory Visit: Payer: Medicare Other | Admitting: "Endocrinology

## 2023-03-03 VITALS — BP 112/78 | HR 84 | Ht 64.0 in | Wt 135.4 lb

## 2023-03-03 DIAGNOSIS — M810 Age-related osteoporosis without current pathological fracture: Secondary | ICD-10-CM | POA: Diagnosis not present

## 2023-03-03 DIAGNOSIS — D649 Anemia, unspecified: Secondary | ICD-10-CM | POA: Diagnosis not present

## 2023-03-03 DIAGNOSIS — R030 Elevated blood-pressure reading, without diagnosis of hypertension: Secondary | ICD-10-CM | POA: Diagnosis not present

## 2023-03-03 DIAGNOSIS — K760 Fatty (change of) liver, not elsewhere classified: Secondary | ICD-10-CM | POA: Diagnosis not present

## 2023-03-03 DIAGNOSIS — R1310 Dysphagia, unspecified: Secondary | ICD-10-CM | POA: Diagnosis not present

## 2023-03-03 DIAGNOSIS — E0520 Thyrotoxicosis with toxic multinodular goiter without thyrotoxic crisis or storm: Secondary | ICD-10-CM | POA: Diagnosis not present

## 2023-03-03 DIAGNOSIS — F17200 Nicotine dependence, unspecified, uncomplicated: Secondary | ICD-10-CM | POA: Diagnosis not present

## 2023-03-03 DIAGNOSIS — Z0001 Encounter for general adult medical examination with abnormal findings: Secondary | ICD-10-CM | POA: Diagnosis not present

## 2023-03-03 DIAGNOSIS — Z6822 Body mass index (BMI) 22.0-22.9, adult: Secondary | ICD-10-CM | POA: Diagnosis not present

## 2023-03-03 DIAGNOSIS — G8929 Other chronic pain: Secondary | ICD-10-CM | POA: Diagnosis not present

## 2023-03-03 NOTE — Progress Notes (Signed)
03/03/2023, 6:26 PM                        Endocrinology follow-up note  Subjective:    Patient ID: Nicole Ewing, female    DOB: Dec 17, 1960, PCP [place] Nation, MD   Past Medical History:  Diagnosis Date   Acid reflux    Anemia    Anxiety    Arthritis    Cervical cancer ([place])    COPD (chronic obstructive pulmonary disease) ([place])    Depression    H/O: hysterectomy    Cervical dysplasia   High cholesterol    Hyperthyroidism    Menopause ovarian failure    Migraines    Osteoporosis    Past Surgical History:  Procedure Laterality Date   ABDOMINAL HYSTERECTOMY     BIOPSY  03/19/2021   Procedure: BIOPSY;  Surgeon: Daneil Dolin, MD;  [facility];  Service: Endoscopy;;   BIOPSY  10/11/2022   Procedure: BIOPSY;  Surgeon: Daneil Dolin, MD;  [facility];  Service: Endoscopy;;   BREAST SURGERY Right    breast buiopsy   COLONOSCOPY  04/06/2007   [facility]:  Normal rectum, no inflammation, no colonic or rectal polyps, tortuous and redundant colon. Recommendations to repeat TCS in 5 years (2013).    COLONOSCOPY WITH PROPOFOL N/A 02/07/2020   Procedure: COLONOSCOPY WITH PROPOFOL;  Surgeon: Daneil Dolin, MD;  1 hyperplastic polyp. Otherwise normal exam. Recommended repeat in 5 years.    COLONOSCOPY WITH PROPOFOL N/A 10/11/2022   Surgeon: Daneil Dolin, MD;   Hemorrhoids on perianal exam, nonbleeding external and internal hemorrhoids, otherwise normal exam.  Repeat in 10 years.   ESOPHAGOGASTRODUODENOSCOPY (EGD) WITH PROPOFOL N/A 03/19/2021   Surgeon: Daneil Dolin, MD;  Normal esophagus s/p dilation and biopsy, patchy erythema of gastric mucosa particularly the body with a polypoid appearance s/p biopsy, normal examined duodenum.  Pathology with chronic mild gastritis, negative for H. pylori.  Esophageal biopsy benign.   ESOPHAGOGASTRODUODENOSCOPY (EGD) WITH PROPOFOL N/A 10/11/2022    Surgeon: Daneil Dolin, MD; Normal esophagus, gastric ulcer with surrounding erosions biopsied, duodenal erosions biopsied. Gastric biopsy was negative for H. pylori, duodenal biopsy benign.   MALONEY DILATION N/A 03/19/2021   Procedure: Venia Minks DILATION;  Surgeon: Daneil Dolin, MD;  [facility];  Service: Endoscopy;  Laterality: N/A;   POLYPECTOMY  02/07/2020   Procedure: POLYPECTOMY;  Surgeon: Daneil Dolin, MD;  [facility];  Service: Endoscopy;;   TONSILLECTOMY     TOTAL HIP ARTHROPLASTY Left 11/16/2018   TOTAL HIP ARTHROPLASTY Right 07/02/2019   Social History   Socioeconomic History   Marital status: Married    Spouse name: Not on file   Number of children: Not on file   Years of education: Not on file   Highest education level: Not on file  Occupational History   Not on file  Tobacco Use   Smoking status: Former    Packs/day: 0.25    Years: 25.00    Additional pack years: 0.00    Total pack years: 6.25    Types: Cigars, Cigarettes    Quit date: 05/2022  Years since quitting: 0.8   Smokeless tobacco: Former   Tobacco comments:    1-2 cigars daily  Vaping Use   Vaping Use: Some days  Substance and Sexual Activity   Alcohol use: Not Currently    Comment: 4-6 beers daily (decresed to 2 every other day -08/15/2020); quit January 2022   Drug use: Not Currently   Sexual activity: Yes    Birth control/protection: Surgical  Other Topics Concern   Not on file  Social History Narrative   Not on file   Social Determinants of Health   Financial Resource Strain: Not on file  Food Insecurity: Not on file  Transportation Needs: Not on file  Physical Activity: Not on file  Stress: Not on file  Social Connections: Not on file   Family History  Problem Relation Age of Onset   Melanoma Father    Nicole Ewing' disease Child    Colon polyps Brother    Colon cancer Neg Hx    Outpatient Encounter Medications as of 03/03/2023  Medication Sig    linaclotide (LINZESS) 290 MCG CAPS capsule Take 290 mcg by mouth daily before breakfast.   Blood Glucose Monitoring Suppl (ACCU-CHEK GUIDE ME) w/Device KIT 1 Piece by Does not apply route as directed.   Calcium Carbonate-Vitamin D (CALCIUM 600+D PO) Take 2 tablets by mouth daily. 1200mg  calcium, 1000mg  vit D   diazepam (VALIUM) 5 MG tablet Take 5 mg by mouth in the morning and at bedtime.   glucose blood (ACCU-CHEK GUIDE) test strip USE ONLY WHEN YOU GET SYMPTOMS OF LOW GLUCOSE   Lancets MISC 1 each by Does not apply route as directed.   loratadine (CLARITIN) 10 MG tablet Take 10 mg by mouth daily.   methocarbamol (ROBAXIN) 750 MG tablet Take 750 mg by mouth 4 (four) times daily as needed for muscle spasms.   Multiple Vitamin (MULTIVITAMIN WITH MINERALS) TABS tablet Take 1 tablet by mouth daily.   pantoprazole (PROTONIX) 40 MG tablet TAKE ONE TABLET BY MOUTH TWICE DAILY   Probiotic Product (PROBIOTIC DAILY PO) Take 1 capsule by mouth daily.   vitamin B-12 (CYANOCOBALAMIN) 250 MCG tablet Take 250 mcg by mouth daily.   Zoledronic Acid (RECLAST IV) Inject into the vein. Once a year. Last had 10/07/21   [DISCONTINUED] docusate sodium (COLACE) 100 MG capsule Take 100 mg by mouth daily.   No facility-administered encounter medications on file as of 03/03/2023.   ALLERGIES: Allergies  Allergen Reactions   Codeine Rash, Hives and Nausea And Vomiting   Orphenadrine Nausea And Vomiting   Other Rash   Keflex [Cephalexin] Rash   Tramadol Nausea And Vomiting and Nausea Only    Sick on stomach     VACCINATION STATUS: Immunization History  Administered Date(s) Administered   Covid-19, Mrna,Vaccine(Spikevax)5yrs and older 09/11/2022   Hepatitis B, ADULT 09/06/2019, 10/11/2019, 04/03/2020   PNEUMOCOCCAL CONJUGATE-20 05/25/2022   Td (Adult), 2 Lf Tetanus Toxid, Preservative Free 09/27/1995   Tdap 03/19/2015   Zoster Recombinat (Shingrix) 11/16/2021, 01/19/2022    HPI Nicole PORRITT is 62 y.o.  female who is returning with repeat thyroid function test after she was seen in consultation for hyperthyroidism.    PMD: [place] Nation, MD.   She is known to have chronic/subacute hyponatremia, this has resolved.  -After workup for hyperthyroidism showed a hyperfunctioning adenoma on the left lobe of her thyroid, she was given radioactive iodine thyroid ablation which she received on December 14, 2022.   She presents with thyroid function  test consistent with treatment effect with euthyroid state without hypothyroidism.   She has gained 5 pounds of weight since last visit.  She has no new complaints today. She previously underwent went fine-needle aspiration of a cold nodule in the thyroid with benign outcomes. Her previous symptoms of palpitations, tremors, heat intolerance have largely resolved.     She does have family history of thyroid dysfunction in her daughter.   She denies dysphagia, shortness of breath, nor voice change. She admits to upto 6 beers daily.  Review of Systems Limited as above.  Objective:    BP 112/78   Pulse 84   Ht 5\' 4"  (1.626 m)   Wt 135 lb 6.4 oz (61.4 kg)   BMI 23.24 kg/m   Wt Readings from Last 3 Encounters:  03/03/23 135 lb 6.4 oz (61.4 kg)  01/27/23 130 lb 3.2 oz (59.1 kg)  01/25/23 129 lb 6.4 oz (58.7 kg)    Physical Exam  CMP ( most recent) CMP     Component Value Date/Time   NA 133 (L) 01/18/2023 1307   NA 139 08/18/2022 0000   K 3.7 01/18/2023 1307   CL 101 01/18/2023 1307   CO2 26 01/18/2023 1307   GLUCOSE 127 (H) 01/18/2023 1307   BUN 11 01/18/2023 1307   BUN 8 08/18/2022 0000   CREATININE 0.53 01/18/2023 1307   CREATININE 0.43 (L) 06/05/2019 1446   CALCIUM 9.2 01/18/2023 1307   PROT 7.0 01/18/2023 1307   ALBUMIN 4.0 01/18/2023 1307   AST 22 01/18/2023 1307   ALT 20 01/18/2023 1307   ALKPHOS 45 01/18/2023 1307   BILITOT 0.3 01/18/2023 1307   GFRNONAA >60 01/18/2023 1307   GFRNONAA 112 06/05/2019 1446   GFRAA >60  12/04/2019 1353   GFRAA 130 06/05/2019 1446    Lipid Panel     Component Value Date/Time   CHOL 242 (A) 08/18/2022 0000   TRIG 62 08/18/2022 0000   HDL 107 (A) 08/18/2022 0000   LDLCALC 123 08/18/2022 0000      Lab Results  Component Value Date   TSH 0.594 02/24/2023   TSH <0.010 (L) 01/18/2023   TSH 0.21 (A) 08/18/2022   TSH 0.597 02/24/2022   TSH 0.699 08/26/2021   TSH 0.593 02/23/2021   TSH 1.110 08/28/2020   TSH 0.543 02/25/2020   TSH 0.95 09/26/2019   TSH 0.01 (A) 04/26/2019   TSH 0.01 (A) 04/26/2019   FREET4 0.87 02/24/2023   FREET4 1.84 (H) 01/18/2023   FREET4 0.88 02/24/2022   FREET4 0.78 08/26/2021   FREET4 0.86 02/23/2021   FREET4 0.75 08/28/2020   FREET4 0.69 02/25/2020   FREET4 0.7 (L) 09/26/2019      Assessment & Plan:   1. Hyperthyroidism-from hyperfunctioning adenoma at the inferior left thyroid lobe     She is a status post radioactive iodine to ablate toxic thyroid adenoma in the left lobe of her thyroid.  She presents with thyroid function test consistent with euthyroid state without hypothyroidism.  She will not be initiated on  levothyroxine until next measurement in 4 weeks.   -She has been made aware of the likelihood of posttherapy hypothyroidism which will require left long thyroid hormone replacement.  -She has a negative fine-needle aspiration of a thyroid nodule.   She was counseled on smoking cessation last visit, presents with successful quitting. She has high risk for relapse.  She will need follow-up CMP given her history of hyponatremia.  -Her recent labs showed correction of her  previously documented hyper transaminases.   - I advised her  to maintain close follow up with Morton Nation, MD for primary care needs.   I spent  22  minutes in the care of the patient today including review of labs from Thyroid Function, CMP, and other relevant labs ; imaging/biopsy records (current and previous including abstractions from other  facilities); face-to-face time discussing  her lab results and symptoms, medications doses, her options of short and long term treatment based on the latest standards of care / guidelines;   and documenting the encounter.  Salomon Mast Poznanski  participated in the discussions, expressed understanding, and voiced agreement with the above plans.  All questions were answered to her satisfaction. she is encouraged to contact clinic should she have any questions or concerns prior to her return visit.   Follow up plan: Return in about 5 weeks (around 04/07/2023) for F/U with Pre-visit Labs.   Glade Lloyd, MD [facility] [address] [place], [place] [address] Phone: [phone]  Fax: [phone]     03/03/2023, 6:26 PM  This note was partially dictated with voice recognition software. Similar sounding words can be transcribed inadequately or may not  be corrected upon review.

## 2023-03-04 ENCOUNTER — Other Ambulatory Visit: Payer: Self-pay | Admitting: Gastroenterology

## 2023-03-04 DIAGNOSIS — K5903 Drug induced constipation: Secondary | ICD-10-CM

## 2023-03-04 MED ORDER — LINACLOTIDE 290 MCG PO CAPS: 290.0000 ug | ORAL_CAPSULE | Freq: Every day | ORAL | 5 refills | Status: DC

## 2023-03-09 ENCOUNTER — Ambulatory Visit: Payer: Medicare Other | Admitting: "Endocrinology

## 2023-03-21 NOTE — Progress Notes (Unsigned)
Referring Provider: Donetta Potts, MD Primary Care Physician:  Donetta Potts, MD Primary GI Physician: Dr. Jena Gauss  No chief complaint on file.   HPI:   Nicole Ewing is a 62 y.o. female  with history of GERD, dysphagia resolved with empiric dilation, elevated LFTs, previously with daily alcohol use (abstinent since January 2022), history of intranasal drug use in her 71s, constipation, unintentional weight loss between 2020-2022, IDA found to have gastric ulcer and duodenal erosions on EGD BC powders, colonoscopy with external and internal hemorrhoids, following with hematology and eating, presenting today for follow-up.  Last seen in our office 01/27/2023.  Hemoglobin/iron levels normalized with last iron infusion in October.  No overt GI bleeding.  Continue with pantoprazole twice daily.  Had discontinued BC powders.  GERD well-controlled.  Constipation not adequately managed on Colace twice daily and occasional MiraLAX.  LFTs remain normal.  No alcohol.  Reported she was having some trouble with her thyroid again.  Had iodine ablation and upcoming follow-up with Dr. Fransico Him.  Due to thyroid issues, recommended holding off on surveillance EGD for now, continue current medications, try Linzess 145 mcg daily for constipation.  Patient called to report that Linzess 145 mcg was not working well.  Samples of 290 mcg were left for patient and she reported improvement with this.  Today:     Thyroid***  Past Medical History:  Diagnosis Date   Acid reflux    Anemia    Anxiety    Arthritis    Cervical cancer ([facility])    COPD (chronic obstructive pulmonary disease) ([facility])    Depression    H/O: hysterectomy    Cervical dysplasia   High cholesterol    Hyperthyroidism    Menopause ovarian failure    Migraines    Osteoporosis     Past Surgical History:  Procedure Laterality Date   ABDOMINAL HYSTERECTOMY     BIOPSY  03/19/2021   Procedure: BIOPSY;  Surgeon: Corbin Ade, MD;   [facility];  Service: Endoscopy;;   BIOPSY  10/11/2022   Procedure: BIOPSY;  Surgeon: Corbin Ade, MD;  [facility];  Service: Endoscopy;;   BREAST SURGERY Right    breast buiopsy   COLONOSCOPY  04/06/2007   [facility]:  Normal rectum, no inflammation, no colonic or rectal polyps, tortuous and redundant colon. Recommendations to repeat TCS in 5 years (2013).    COLONOSCOPY WITH PROPOFOL N/A 02/07/2020   Procedure: COLONOSCOPY WITH PROPOFOL;  Surgeon: Corbin Ade, MD;  1 hyperplastic polyp. Otherwise normal exam. Recommended repeat in 5 years.    COLONOSCOPY WITH PROPOFOL N/A 10/11/2022   Surgeon: Corbin Ade, MD;   Hemorrhoids on perianal exam, nonbleeding external and internal hemorrhoids, otherwise normal exam.  Repeat in 10 years.   ESOPHAGOGASTRODUODENOSCOPY (EGD) WITH PROPOFOL N/A 03/19/2021   Surgeon: Corbin Ade, MD;  Normal esophagus s/p dilation and biopsy, patchy erythema of gastric mucosa particularly the body with a polypoid appearance s/p biopsy, normal examined duodenum.  Pathology with chronic mild gastritis, negative for H. pylori.  Esophageal biopsy benign.   ESOPHAGOGASTRODUODENOSCOPY (EGD) WITH PROPOFOL N/A 10/11/2022   Surgeon: Corbin Ade, MD; Normal esophagus, gastric ulcer with surrounding erosions biopsied, duodenal erosions biopsied. Gastric biopsy was negative for H. pylori, duodenal biopsy benign.   MALONEY DILATION N/A 03/19/2021   Procedure: Elease Hashimoto DILATION;  Surgeon: Corbin Ade, MD;  [facility];  Service: Endoscopy;  Laterality: N/A;  POLYPECTOMY  02/07/2020   Procedure: POLYPECTOMY;  Surgeon: Corbin Ade, MD;  [facility];  Service: Endoscopy;;   TONSILLECTOMY     TOTAL HIP ARTHROPLASTY Left 11/16/2018   TOTAL HIP ARTHROPLASTY Right 07/02/2019    Current Outpatient Medications  Medication Sig Dispense Refill   Blood Glucose Monitoring Suppl (ACCU-CHEK GUIDE ME) w/Device KIT  1 Piece by Does not apply route as directed. 1 kit 0   Calcium Carbonate-Vitamin D (CALCIUM 600+D PO) Take 2 tablets by mouth daily.  calcium,  vit D     diazepam (VALIUM) 5 MG tablet Take 5 mg by mouth in the morning and at bedtime.     glucose blood (ACCU-CHEK GUIDE) test strip USE ONLY WHEN YOU GET SYMPTOMS OF LOW GLUCOSE 50 strip 2   Lancets MISC 1 each by Does not apply route as directed. 100 each 3   linaclotide (LINZESS) 290 MCG CAPS capsule Take 1 capsule (290 mcg total) by mouth daily before breakfast. 30 capsule 5   loratadine (CLARITIN) 10 MG tablet Take 10 mg by mouth daily.     methocarbamol (ROBAXIN) 750 MG tablet Take 750 mg by mouth 4 (four) times daily as needed for muscle spasms.     Multiple Vitamin (MULTIVITAMIN WITH MINERALS) TABS tablet Take 1 tablet by mouth daily.     pantoprazole (PROTONIX) 40 MG tablet TAKE ONE TABLET BY MOUTH TWICE DAILY 60 tablet 3   Probiotic Product (PROBIOTIC DAILY PO) Take 1 capsule by mouth daily.     vitamin B-12 (CYANOCOBALAMIN) 250 MCG tablet Take 250 mcg by mouth daily.     Zoledronic Acid (RECLAST IV) Inject into the vein. Once a year. Last had 10/07/21     No current facility-administered medications for this visit.    Allergies as of 03/24/2023 - Review Complete 03/03/2023  Allergen Reaction Noted   Codeine Rash, Hives, and Nausea And Vomiting 01/27/2017   Orphenadrine Nausea And Vomiting 10/07/2021   Other Rash 12/21/2022   Keflex [cephalexin] Rash 01/27/2017   Tramadol Nausea And Vomiting and Nausea Only 09/11/2018    Family History  Problem Relation Age of Onset   Melanoma Father    Graves' disease Child    Colon polyps Brother    Colon cancer Neg Hx     Social History   Socioeconomic History   Marital status: Married    Spouse name: Not on file   Number of children: Not on file   Years of education: Not on file   Highest education level: Not on file  Occupational History   Not on file  Tobacco Use    Smoking status: Former    Packs/day: 0.25    Years: 25.00    Additional pack years: 0.00    Total pack years: 6.25    Types: Cigars, Cigarettes    Quit date: 05/2022    Years since quitting: 0.8   Smokeless tobacco: Former   Tobacco comments:    1-2 cigars daily  Vaping Use   Vaping Use: Some days  Substance and Sexual Activity   Alcohol use: Not Currently    Comment: 4-6 beers daily (decresed to 2 every other day -08/15/2020); quit January 2022   Drug use: Not Currently   Sexual activity: Yes    Birth control/protection: Surgical  Other Topics Concern   Not on file  Social History Narrative   Not on file   Social Determinants of Health   Financial Resource Strain: Not on file  Food Insecurity: Not on file  Transportation Needs: Not on file  Physical Activity: Not on file  Stress: Not on file  Social Connections: Not on file    Review of Systems: Gen: Denies fever, chills, anorexia. Denies fatigue, weakness, weight loss.  CV: Denies chest pain, palpitations, syncope, peripheral edema, and claudication. Resp: Denies dyspnea at rest, cough, wheezing, coughing up blood, and pleurisy. GI: Denies vomiting blood, jaundice, and fecal incontinence.   Denies dysphagia or odynophagia. Derm: Denies rash, itching, dry skin Psych: Denies depression, anxiety, memory loss, confusion. No homicidal or suicidal ideation.  Heme: Denies bruising, bleeding, and enlarged lymph nodes.  Physical Exam: There were no vitals taken for this visit. General:   Alert and oriented. No distress noted. Pleasant and cooperative.  Head:  Normocephalic and atraumatic. Eyes:  Conjuctiva clear without scleral icterus. Heart:  S1, S2 present without murmurs appreciated. Lungs:  Clear to auscultation bilaterally. No wheezes, rales, or rhonchi. No distress.  Abdomen:  +BS, soft, non-tender and non-distended. No rebound or guarding. No HSM or masses noted. Msk:  Symmetrical without gross deformities. Normal  posture. Extremities:  Without edema. Neurologic:  Alert and  oriented x4 Psych:  Normal mood and affect.    Assessment:     Plan:  ***   Ermalinda Memos, PA-C [facility] 03/24/2023

## 2023-03-22 DIAGNOSIS — G8929 Other chronic pain: Secondary | ICD-10-CM | POA: Diagnosis not present

## 2023-03-22 DIAGNOSIS — Z79891 Long term (current) use of opiate analgesic: Secondary | ICD-10-CM | POA: Diagnosis not present

## 2023-03-22 DIAGNOSIS — M25561 Pain in right knee: Secondary | ICD-10-CM | POA: Diagnosis not present

## 2023-03-22 DIAGNOSIS — G894 Chronic pain syndrome: Secondary | ICD-10-CM | POA: Diagnosis not present

## 2023-03-22 DIAGNOSIS — M25562 Pain in left knee: Secondary | ICD-10-CM | POA: Diagnosis not present

## 2023-03-22 DIAGNOSIS — M47816 Spondylosis without myelopathy or radiculopathy, lumbar region: Secondary | ICD-10-CM | POA: Diagnosis not present

## 2023-03-23 DIAGNOSIS — D500 Iron deficiency anemia secondary to blood loss (chronic): Secondary | ICD-10-CM | POA: Diagnosis not present

## 2023-03-24 ENCOUNTER — Encounter: Payer: Self-pay | Admitting: *Deleted

## 2023-03-24 ENCOUNTER — Encounter: Payer: Self-pay | Admitting: Gastroenterology

## 2023-03-24 ENCOUNTER — Ambulatory Visit: Payer: Medicare Other | Admitting: Gastroenterology

## 2023-03-24 ENCOUNTER — Telehealth: Payer: Self-pay | Admitting: *Deleted

## 2023-03-24 VITALS — BP 128/80 | HR 80 | Temp 97.6°F | Ht 64.0 in | Wt 138.6 lb

## 2023-03-24 DIAGNOSIS — D509 Iron deficiency anemia, unspecified: Secondary | ICD-10-CM | POA: Diagnosis not present

## 2023-03-24 DIAGNOSIS — K5900 Constipation, unspecified: Secondary | ICD-10-CM | POA: Diagnosis not present

## 2023-03-24 DIAGNOSIS — Z8711 Personal history of peptic ulcer disease: Secondary | ICD-10-CM

## 2023-03-24 DIAGNOSIS — R7989 Other specified abnormal findings of blood chemistry: Secondary | ICD-10-CM

## 2023-03-24 DIAGNOSIS — K219 Gastro-esophageal reflux disease without esophagitis: Secondary | ICD-10-CM

## 2023-03-24 NOTE — Patient Instructions (Addendum)
We will arrange you to have an upper endoscopy in the near future with Dr. Jena Gauss to ensure that your ulcer has healed.  You can decrease pantoprazole to 40 mg once a day.  Continue to avoid all NSAID products.  Continue Linzess 290 mcg daily.  We will plan to follow-up with you in 6 months.  Do not hesitate to call sooner if you have questions or concerns.  It was great to see you again today!  Ermalinda Memos, PA-C [facility]

## 2023-03-24 NOTE — Telephone Encounter (Signed)
[employer] PA:  Notification or Prior Authorization is not required for the requested services You are not required to submit a notification/prior authorization based on the information provided. If you have general questions about the prior authorization requirements, visit [email or web address] > Clinician Resources > Advance and Admission Notification Requirements. The number above acknowledges your notification. Please write this reference number down for future reference. If you would like to request an organization determination, please call us at [phone]. Decision ID #: [id number]

## 2023-04-05 ENCOUNTER — Other Ambulatory Visit (HOSPITAL_COMMUNITY)
Admission: RE | Admit: 2023-04-05 | Discharge: 2023-04-05 | Disposition: A | Discharge status: Home / Self Care | Payer: Medicare Other | Source: Ambulatory Visit | Attending: "Endocrinology | Admitting: "Endocrinology

## 2023-04-05 DIAGNOSIS — E0520 Thyrotoxicosis with toxic multinodular goiter without thyrotoxic crisis or storm: Secondary | ICD-10-CM | POA: Diagnosis not present

## 2023-04-05 LAB — COMPREHENSIVE METABOLIC PANEL
ALT: 19 U/L (ref 0–44)
AST: 26 U/L (ref 15–41)
Albumin: 4 g/dL (ref 3.5–5.0)
Alkaline Phosphatase: 49 U/L (ref 38–126)
Anion gap: 10 (ref 5–15)
BUN: 10 mg/dL (ref 8–23)
CO2: 23 mmol/L (ref 22–32)
Calcium: 8.8 mg/dL — ABNORMAL LOW (ref 8.9–10.3)
Chloride: 96 mmol/L — ABNORMAL LOW (ref 98–111)
Creatinine, Ser: 0.71 mg/dL (ref 0.44–1.00)
GFR, Estimated: >60 mL/min (ref >60)
Glucose, Bld: 110 mg/dL — ABNORMAL HIGH (ref 70–99)
Potassium: 4 mmol/L (ref 3.5–5.1)
Sodium: 129 mmol/L — ABNORMAL LOW (ref 135–145)
Total Bilirubin: 0.5 mg/dL (ref 0.3–1.2)
Total Protein: 7.1 g/dL (ref 6.5–8.1)

## 2023-04-05 LAB — TSH: TSH: 31.308 u[IU]/mL — ABNORMAL HIGH (ref 0.350–4.500)

## 2023-04-05 LAB — T4, FREE: Free T4: 0.52 ng/dL — ABNORMAL LOW (ref 0.61–1.12)

## 2023-04-07 LAB — T3, FREE: T3, Free: 1.3 pg/mL — ABNORMAL LOW (ref 2.0–4.4)

## 2023-04-12 ENCOUNTER — Encounter: Payer: Self-pay | Admitting: "Endocrinology

## 2023-04-12 ENCOUNTER — Ambulatory Visit: Payer: Medicare Other | Admitting: "Endocrinology

## 2023-04-12 VITALS — BP 106/68 | HR 68 | Ht 64.0 in | Wt 138.2 lb

## 2023-04-12 DIAGNOSIS — R7303 Prediabetes: Secondary | ICD-10-CM

## 2023-04-12 DIAGNOSIS — E890 Postprocedural hypothyroidism: Secondary | ICD-10-CM | POA: Diagnosis not present

## 2023-04-12 MED ORDER — LEVOTHYROXINE SODIUM 50 MCG PO TABS: 50.0000 ug | ORAL_TABLET | Freq: Every day | ORAL | 2 refills | Status: DC

## 2023-04-12 NOTE — Progress Notes (Signed)
04/12/2023, 6:40 PM                        Endocrinology follow-up note  Subjective:    Patient ID: Nicole Ewing, female    DOB: [birth year], PCP Donetta Potts, MD   Past Medical History:  Diagnosis Date   Acid reflux    Anemia    Anxiety    Arthritis    Cervical cancer (HCC)    COPD (chronic obstructive pulmonary disease) (HCC)    Depression    H/O: hysterectomy    Cervical dysplasia   High cholesterol    Hyperthyroidism    Menopause ovarian failure    Migraines    Osteoporosis    Past Surgical History:  Procedure Laterality Date   ABDOMINAL HYSTERECTOMY     BIOPSY  03/19/2021   Procedure: BIOPSY;  Surgeon: Corbin Ade, MD;  [facility];  Service: Endoscopy;;   BIOPSY  10/11/2022   Procedure: BIOPSY;  Surgeon: Corbin Ade, MD;  [facility];  Service: Endoscopy;;   BREAST SURGERY Right    breast buiopsy   COLONOSCOPY  04/06/2007   [facility]:  Normal rectum, no inflammation, no colonic or rectal polyps, tortuous and redundant colon. Recommendations to repeat TCS in 5 years (2013).    COLONOSCOPY WITH PROPOFOL N/A 02/07/2020   Procedure: COLONOSCOPY WITH PROPOFOL;  Surgeon: Corbin Ade, MD;  1 hyperplastic polyp. Otherwise normal exam. Recommended repeat in 5 years.    COLONOSCOPY WITH PROPOFOL N/A 10/11/2022   Surgeon: Corbin Ade, MD;   Hemorrhoids on perianal exam, nonbleeding external and internal hemorrhoids, otherwise normal exam.  Repeat in 10 years.   ESOPHAGOGASTRODUODENOSCOPY (EGD) WITH PROPOFOL N/A 03/19/2021   Surgeon: Corbin Ade, MD;  Normal esophagus s/p dilation and biopsy, patchy erythema of gastric mucosa particularly the body with a polypoid appearance s/p biopsy, normal examined duodenum.  Pathology with chronic mild gastritis, negative for H. pylori.  Esophageal biopsy benign.   ESOPHAGOGASTRODUODENOSCOPY (EGD) WITH PROPOFOL N/A 10/11/2022    Surgeon: Corbin Ade, MD; Normal esophagus, gastric ulcer with surrounding erosions biopsied, duodenal erosions biopsied. Gastric biopsy was negative for H. pylori, duodenal biopsy benign.   MALONEY DILATION N/A 03/19/2021   Procedure: Elease Hashimoto DILATION;  Surgeon: Corbin Ade, MD;  [facility];  Service: Endoscopy;  Laterality: N/A;   POLYPECTOMY  02/07/2020   Procedure: POLYPECTOMY;  Surgeon: Corbin Ade, MD;  [facility];  Service: Endoscopy;;   TONSILLECTOMY     TOTAL HIP ARTHROPLASTY Left 11/16/2018   TOTAL HIP ARTHROPLASTY Right 07/02/2019   Social History   Socioeconomic History   Marital status: Married    Spouse name: Not on file   Number of children: Not on file   Years of education: Not on file   Highest education level: Not on file  Occupational History   Not on file  Tobacco Use   Smoking status: Former    Packs/day: 0.25    Years: 25.00    Additional pack years: 0.00    Total pack years: 6.25    Types: Cigars, Cigarettes    Quit date: 05/2022  Years since quitting: 0.9   Smokeless tobacco: Former   Tobacco comments:    1-2 cigars daily  Vaping Use   Vaping Use: Some days  Substance and Sexual Activity   Alcohol use: Not Currently    Comment: 4-6 beers daily (decresed to 2 every other day -08/15/2020); quit January 2022   Drug use: Not Currently   Sexual activity: Yes    Birth control/protection: Surgical  Other Topics Concern   Not on file  Social History Narrative   Not on file   Social Determinants of Health   Financial Resource Strain: Not on file  Food Insecurity: Not on file  Transportation Needs: Not on file  Physical Activity: Not on file  Stress: Not on file  Social Connections: Not on file   Family History  Problem Relation Age of Onset   Melanoma Father    Luiz Blare' disease Child    Colon polyps Brother    Colon cancer Neg Hx    Outpatient Encounter Medications as of 04/12/2023  Medication Sig    levothyroxine (SYNTHROID) 50 MCG tablet Take 1 tablet (50 mcg total) by mouth daily before breakfast.   Blood Glucose Monitoring Suppl (ACCU-CHEK GUIDE ME) w/Device KIT 1 Piece by Does not apply route as directed.   Calcium Carbonate-Vitamin D (CALCIUM 600+D PO) Take 2 tablets by mouth daily. 1200mg  calcium, 1000mg  vit D   cetirizine (ZYRTEC) 10 MG tablet Take 10 mg by mouth daily.   diazepam (VALIUM) 5 MG tablet Take 5 mg by mouth in the morning and at bedtime.   glucose blood (ACCU-CHEK GUIDE) test strip USE ONLY WHEN YOU GET SYMPTOMS OF LOW GLUCOSE   HYDROcodone-acetaminophen (NORCO/VICODIN) 5-325 MG tablet Take 1 tablet by mouth every 6 (six) hours as needed for moderate pain.   Lancets MISC 1 each by Does not apply route as directed.   linaclotide (LINZESS) 290 MCG CAPS capsule Take 1 capsule (290 mcg total) by mouth daily before breakfast.   loratadine (CLARITIN) 10 MG tablet Take 10 mg by mouth daily. (Patient not taking: Reported on 03/24/2023)   methocarbamol (ROBAXIN) 750 MG tablet Take 750 mg by mouth 4 (four) times daily as needed for muscle spasms.   Multiple Vitamin (MULTIVITAMIN WITH MINERALS) TABS tablet Take 1 tablet by mouth daily.   pantoprazole (PROTONIX) 40 MG tablet TAKE ONE TABLET BY MOUTH TWICE DAILY   Probiotic Product (PROBIOTIC DAILY PO) Take 1 capsule by mouth daily.   vitamin B-12 (CYANOCOBALAMIN) 250 MCG tablet Take 250 mcg by mouth daily.   Zoledronic Acid (RECLAST IV) Inject into the vein. Once a year. Last had 10/07/21   No facility-administered encounter medications on file as of 04/12/2023.   ALLERGIES: Allergies  Allergen Reactions   Codeine Rash, Hives and Nausea And Vomiting   Orphenadrine Nausea And Vomiting   Other Rash   Keflex [Cephalexin] Rash   Tramadol Nausea And Vomiting and Nausea Only    Sick on stomach     VACCINATION STATUS: Immunization History  Administered Date(s) Administered   Covid-19, Mrna,Vaccine(Spikevax)66yrs and older  09/11/2022   Hepatitis B, ADULT 09/06/2019, 10/11/2019, 04/03/2020   PNEUMOCOCCAL CONJUGATE-20 05/25/2022   Td (Adult), 2 Lf Tetanus Toxid, Preservative Free 09/27/1995   Tdap 03/19/2015   Zoster Recombinat (Shingrix) 11/16/2021, 01/19/2022    HPI Nicole Ewing is 62 y.o. female who is returning with repeat thyroid function test after she was seen in consultation for hyperthyroidism.    PMD: Donetta Potts, MD.   She  is known to have chronic/subacute hyponatremia, this has resolved.  -After workup for hyperthyroidism showed a hyperfunctioning adenoma on the left lobe of her thyroid, she was given radioactive iodine thyroid ablation which she received on December 14, 2022.   She presents with thyroid function test consistent with treatment effect and hypothyroidism.  She is not on levothyroxine yet.    She presents with steady weight.  She has no new complaints today. She previously underwent went fine-needle aspiration of a cold nodule in the thyroid with benign outcomes. Her previous symptoms of palpitations, tremors, heat intolerance have largely resolved.     She does have family history of thyroid dysfunction in her daughter.   She denies dysphagia, shortness of breath, nor voice change. She admits to upto 6 beers daily.  Review of Systems Limited as above.  Objective:    BP 106/68   Pulse 68   Ht 5\' 4"  (1.626 m)   Wt 138 lb 3.2 oz (62.7 kg)   BMI 23.72 kg/m   Wt Readings from Last 3 Encounters:  04/12/23 138 lb 3.2 oz (62.7 kg)  03/24/23 138 lb 9.6 oz (62.9 kg)  03/03/23 135 lb 6.4 oz (61.4 kg)    Physical Exam  CMP ( most recent) CMP     Component Value Date/Time   NA 129 (L) 04/05/2023 1436   NA 139 08/18/2022 0000   K 4.0 04/05/2023 1436   CL 96 (L) 04/05/2023 1436   CO2 23 04/05/2023 1436   GLUCOSE 110 (H) 04/05/2023 1436   BUN 10 04/05/2023 1436   BUN 8 08/18/2022 0000   CREATININE 0.71 04/05/2023 1436   CREATININE 0.43 (L) 06/05/2019 1446    CALCIUM 8.8 (L) 04/05/2023 1436   PROT 7.1 04/05/2023 1436   ALBUMIN 4.0 04/05/2023 1436   AST 26 04/05/2023 1436   ALT 19 04/05/2023 1436   ALKPHOS 49 04/05/2023 1436   BILITOT 0.5 04/05/2023 1436   GFRNONAA >60 04/05/2023 1436   GFRNONAA 112 06/05/2019 1446   GFRAA >60 12/04/2019 1353   GFRAA 130 06/05/2019 1446    Lipid Panel     Component Value Date/Time   CHOL 242 (A) 08/18/2022 0000   TRIG 62 08/18/2022 0000   HDL 107 (A) 08/18/2022 0000   LDLCALC 123 08/18/2022 0000      Lab Results  Component Value Date   TSH 31.308 (H) 04/05/2023   TSH 0.594 02/24/2023   TSH <0.010 (L) 01/18/2023   TSH 0.21 (A) 08/18/2022   TSH 0.597 02/24/2022   TSH 0.699 08/26/2021   TSH 0.593 02/23/2021   TSH 1.110 08/28/2020   TSH 0.543 02/25/2020   TSH 0.95 09/26/2019   FREET4 0.52 (L) 04/05/2023   FREET4 0.87 02/24/2023   FREET4 1.84 (H) 01/18/2023   FREET4 0.88 02/24/2022   FREET4 0.78 08/26/2021   FREET4 0.86 02/23/2021   FREET4 0.75 08/28/2020   FREET4 0.69 02/25/2020   FREET4 0.7 (L) 09/26/2019      Assessment & Plan:   1.  RAI induced hypothyroidism 2.toxic adenoma-status post RAI-resolved.     She is a status post radioactive iodine to ablate toxic thyroid adenoma in the left lobe of her thyroid.  She presents with thyroid function test consistent with hypothyroidism.  I discussed and initiated levothyroxine 50 mcg p.o. daily before breakfast.    - We discussed about the correct intake of her thyroid hormone, on empty stomach at fasting, with water, separated by at least 30 minutes from breakfast and  other medications,  and separated by more than 4 hours from calcium, iron, multivitamins, acid reflux medications (PPIs). -Patient is made aware of the fact that thyroid hormone replacement is needed for life, dose to be adjusted by periodic monitoring of thyroid function tests. -She has a negative fine-needle aspiration of a thyroid nodule.   She was counseled on smoking  cessation last visit, presents with successful quitting. She has high risk for relapse.  She has subacute mild hyponatremia.  This may respond to her thyroid hormone supplement/replacement.  She will be considered for a.m. cortisol next visit.  - I advised her  to maintain close follow up with Donetta Potts, MD for primary care needs.  I spent  22  minutes in the care of the patient today including review of labs from Thyroid Function, CMP, and other relevant labs ; imaging/biopsy records (current and previous including abstractions from other facilities); face-to-face time discussing  her lab results and symptoms, medications doses, her options of short and long term treatment based on the latest standards of care / guidelines;   and documenting the encounter.  Nicole Ewing  participated in the discussions, expressed understanding, and voiced agreement with the above plans.  All questions were answered to her satisfaction. she is encouraged to contact clinic should she have any questions or concerns prior to her return visit.   Follow up plan: Return in about 3 months (around 07/13/2023) for F/U with Pre-visit Labs, A1c -NV.   Marquis Lunch, MD [facility] [address] [place], [place] [address] Phone: [phone]  Fax: [phone]     04/12/2023, 6:40 PM  This note was partially dictated with voice recognition software. Similar sounding words can be transcribed inadequately or may not  be corrected upon review.

## 2023-04-14 ENCOUNTER — Encounter: Payer: Self-pay | Admitting: "Endocrinology

## 2023-04-21 DIAGNOSIS — L821 Other seborrheic keratosis: Secondary | ICD-10-CM | POA: Diagnosis not present

## 2023-04-21 DIAGNOSIS — D0461 Carcinoma in situ of skin of right upper limb, including shoulder: Secondary | ICD-10-CM | POA: Diagnosis not present

## 2023-04-21 DIAGNOSIS — D0439 Carcinoma in situ of skin of other parts of face: Secondary | ICD-10-CM | POA: Diagnosis not present

## 2023-04-21 DIAGNOSIS — D048 Carcinoma in situ of skin of other sites: Secondary | ICD-10-CM | POA: Diagnosis not present

## 2023-04-21 DIAGNOSIS — L568 Other specified acute skin changes due to ultraviolet radiation: Secondary | ICD-10-CM | POA: Diagnosis not present

## 2023-04-21 DIAGNOSIS — Z08 Encounter for follow-up examination after completed treatment for malignant neoplasm: Secondary | ICD-10-CM | POA: Diagnosis not present

## 2023-04-21 DIAGNOSIS — Z1283 Encounter for screening for malignant neoplasm of skin: Secondary | ICD-10-CM | POA: Diagnosis not present

## 2023-04-21 DIAGNOSIS — Z85828 Personal history of other malignant neoplasm of skin: Secondary | ICD-10-CM | POA: Diagnosis not present

## 2023-04-21 DIAGNOSIS — D485 Neoplasm of uncertain behavior of skin: Secondary | ICD-10-CM | POA: Diagnosis not present

## 2023-04-21 DIAGNOSIS — L570 Actinic keratosis: Secondary | ICD-10-CM | POA: Diagnosis not present

## 2023-04-21 DIAGNOSIS — D1801 Hemangioma of skin and subcutaneous tissue: Secondary | ICD-10-CM | POA: Diagnosis not present

## 2023-04-25 ENCOUNTER — Ambulatory Visit (HOSPITAL_BASED_OUTPATIENT_CLINIC_OR_DEPARTMENT_OTHER): Payer: Medicare Other | Admitting: Certified Registered Nurse Anesthetist

## 2023-04-25 ENCOUNTER — Encounter (HOSPITAL_COMMUNITY)
Admission: RE | Disposition: A | Discharge status: Home / Self Care | Payer: Self-pay | Source: Home / Self Care | Attending: Internal Medicine

## 2023-04-25 ENCOUNTER — Ambulatory Visit (HOSPITAL_COMMUNITY)
Admission: RE | Admit: 2023-04-25 | Discharge: 2023-04-25 | Disposition: A | Discharge status: Home / Self Care | Payer: Medicare Other | Attending: Internal Medicine | Admitting: Internal Medicine

## 2023-04-25 ENCOUNTER — Encounter (HOSPITAL_COMMUNITY): Payer: Self-pay | Admitting: Internal Medicine

## 2023-04-25 ENCOUNTER — Ambulatory Visit (HOSPITAL_COMMUNITY): Payer: Medicare Other | Admitting: Certified Registered Nurse Anesthetist

## 2023-04-25 ENCOUNTER — Other Ambulatory Visit: Payer: Self-pay

## 2023-04-25 DIAGNOSIS — J449 Chronic obstructive pulmonary disease, unspecified: Secondary | ICD-10-CM | POA: Diagnosis not present

## 2023-04-25 DIAGNOSIS — E039 Hypothyroidism, unspecified: Secondary | ICD-10-CM | POA: Diagnosis not present

## 2023-04-25 DIAGNOSIS — Z83719 Family history of colon polyps, unspecified: Secondary | ICD-10-CM | POA: Insufficient documentation

## 2023-04-25 DIAGNOSIS — K317 Polyp of stomach and duodenum: Secondary | ICD-10-CM | POA: Diagnosis not present

## 2023-04-25 DIAGNOSIS — Z87891 Personal history of nicotine dependence: Secondary | ICD-10-CM

## 2023-04-25 DIAGNOSIS — K259 Gastric ulcer, unspecified as acute or chronic, without hemorrhage or perforation: Secondary | ICD-10-CM | POA: Insufficient documentation

## 2023-04-25 DIAGNOSIS — K219 Gastro-esophageal reflux disease without esophagitis: Secondary | ICD-10-CM

## 2023-04-25 DIAGNOSIS — Z8711 Personal history of peptic ulcer disease: Secondary | ICD-10-CM

## 2023-04-25 DIAGNOSIS — D638 Anemia in other chronic diseases classified elsewhere: Secondary | ICD-10-CM

## 2023-04-25 DIAGNOSIS — D509 Iron deficiency anemia, unspecified: Secondary | ICD-10-CM

## 2023-04-25 HISTORY — PX: ESOPHAGOGASTRODUODENOSCOPY (EGD) WITH PROPOFOL: SHX5813

## 2023-04-25 LAB — GLUCOSE, CAPILLARY: Glucose-Capillary: 86 mg/dL (ref 70–99)

## 2023-04-25 SURGERY — ESOPHAGOGASTRODUODENOSCOPY (EGD) WITH PROPOFOL
Anesthesia: General

## 2023-04-25 MED ORDER — LIDOCAINE HCL (PF) 2 % IJ SOLN
INTRAMUSCULAR | Status: AC
  Filled 2023-04-25: qty 5

## 2023-04-25 MED ORDER — PROPOFOL 10 MG/ML IV BOLUS
INTRAVENOUS | Status: DC | PRN
  Administered 2023-04-25: 80 mg via INTRAVENOUS

## 2023-04-25 MED ORDER — LIDOCAINE HCL (PF) 2 % IJ SOLN
INTRAMUSCULAR | Status: DC | PRN
  Administered 2023-04-25: 50 mg via INTRADERMAL

## 2023-04-25 MED ORDER — LACTATED RINGERS IV SOLN: INTRAVENOUS | Status: DC

## 2023-04-25 MED ORDER — PROPOFOL 500 MG/50ML IV EMUL
INTRAVENOUS | Status: DC | PRN
  Administered 2023-04-25: 180 ug/kg/min via INTRAVENOUS

## 2023-04-25 MED ORDER — PROPOFOL 500 MG/50ML IV EMUL
INTRAVENOUS | Status: AC
  Filled 2023-04-25: qty 50

## 2023-04-25 NOTE — H&P (Signed)
@LOGO @   Primary Care Physician:  Donetta Potts, MD Primary Gastroenterologist:  Dr. Jena Gauss  Pre-Procedure History & Physical: HPI:  Nicole Ewing is a 62 y.o. female here for   Surveillance EGD.  History of gastric ulcer last year.  H. pylori biopsies negative.  Duodenal biopsies negative for sprue.    No dysphagia.  Past Medical History:  Diagnosis Date   Acid reflux    Anemia    Anxiety    Arthritis    Cervical cancer (HCC)    COPD (chronic obstructive pulmonary disease) (HCC)    Depression    H/O: hysterectomy    Cervical dysplasia   High cholesterol    Hyperthyroidism    Menopause ovarian failure    Migraines    Osteoporosis     Past Surgical History:  Procedure Laterality Date   ABDOMINAL HYSTERECTOMY     BIOPSY  03/19/2021   Procedure: BIOPSY;  Surgeon: Corbin Ade, MD;  [facility];  Service: Endoscopy;;   BIOPSY  10/11/2022   Procedure: BIOPSY;  Surgeon: Corbin Ade, MD;  [facility];  Service: Endoscopy;;   BREAST SURGERY Right    breast buiopsy   COLONOSCOPY  04/06/2007   [facility]:  Normal rectum, no inflammation, no colonic or rectal polyps, tortuous and redundant colon. Recommendations to repeat TCS in 5 years (2013).    COLONOSCOPY WITH PROPOFOL N/A 02/07/2020   Procedure: COLONOSCOPY WITH PROPOFOL;  Surgeon: Corbin Ade, MD;  1 hyperplastic polyp. Otherwise normal exam. Recommended repeat in 5 years.    COLONOSCOPY WITH PROPOFOL N/A 10/11/2022   Surgeon: Corbin Ade, MD;   Hemorrhoids on perianal exam, nonbleeding external and internal hemorrhoids, otherwise normal exam.  Repeat in 10 years.   ESOPHAGOGASTRODUODENOSCOPY (EGD) WITH PROPOFOL N/A 03/19/2021   Surgeon: Corbin Ade, MD;  Normal esophagus s/p dilation and biopsy, patchy erythema of gastric mucosa particularly the body with a polypoid appearance s/p biopsy, normal examined duodenum.  Pathology with chronic mild gastritis, negative for H.  pylori.  Esophageal biopsy benign.   ESOPHAGOGASTRODUODENOSCOPY (EGD) WITH PROPOFOL N/A 10/11/2022   Surgeon: Corbin Ade, MD; Normal esophagus, gastric ulcer with surrounding erosions biopsied, duodenal erosions biopsied. Gastric biopsy was negative for H. pylori, duodenal biopsy benign.   MALONEY DILATION N/A 03/19/2021   Procedure: Elease Hashimoto DILATION;  Surgeon: Corbin Ade, MD;  [facility];  Service: Endoscopy;  Laterality: N/A;   POLYPECTOMY  02/07/2020   Procedure: POLYPECTOMY;  Surgeon: Corbin Ade, MD;  [facility];  Service: Endoscopy;;   TONSILLECTOMY     TOTAL HIP ARTHROPLASTY Left 11/16/2018   TOTAL HIP ARTHROPLASTY Right 07/02/2019    Prior to Admission medications   Medication Sig Start Date End Date Taking? Authorizing Provider  Blood Glucose Monitoring Suppl (ACCU-CHEK GUIDE ME) w/Device KIT 1 Piece by Does not apply route as directed. 03/02/21  Yes Nida, Denman George, MD  Calcium Carbonate-Vitamin D (CALCIUM 600+D PO) Take 2 tablets by mouth daily. 1200mg  calcium, 1000mg  vit D   Yes [provider]  cetirizine (ZYRTEC) 10 MG tablet Take 10 mg by mouth daily.   Yes [provider]  diazepam (VALIUM) 5 MG tablet Take 5 mg by mouth See admin instructions. Take 5 mg at bedtime, may take a 5 mg dose during the day as needed for anxiety   Yes [provider]  glucose blood (ACCU-CHEK GUIDE) test strip USE ONLY WHEN YOU GET  SYMPTOMS OF LOW GLUCOSE 09/03/21  Yes Nida, Denman George, MD  HYDROcodone-acetaminophen (NORCO/VICODIN) 5-325 MG tablet Take 1 tablet by mouth 2 (two) times daily as needed for moderate pain.   Yes [provider]  Lancets MISC 1 each by Does not apply route as directed. 03/02/21  Yes Roma Kayser, MD  levothyroxine (SYNTHROID) 50 MCG tablet Take 1 tablet (50 mcg total) by mouth daily before breakfast. 04/12/23  Yes Nida, Denman George, MD  linaclotide [facility]) 290 MCG CAPS capsule  Take 1 capsule (290 mcg total) by mouth daily before breakfast. 03/04/23  Yes Letta Median, PA-C  Multiple Vitamin (MULTIVITAMIN WITH MINERALS) TABS tablet Take 1 tablet by mouth daily.   Yes [provider]  pantoprazole (PROTONIX) 40 MG tablet TAKE ONE TABLET BY MOUTH TWICE DAILY 02/15/23  Yes Carlan, Chelsea L, NP  Polyethyl Glycol-Propyl Glycol (SYSTANE ULTRA OP) Place 1 drop into both eyes daily as needed (dry eyes).   Yes [provider]  Probiotic Product (PROBIOTIC DAILY PO) Take 1 capsule by mouth daily.   Yes [provider]  vitamin B-12 (CYANOCOBALAMIN) 250 MCG tablet Take 250 mcg by mouth daily.   Yes [provider]  Zoledronic Acid (RECLAST IV) Inject into the vein. Once a year. Last had 10/07/21   Yes [provider]  methocarbamol (ROBAXIN) 750 MG tablet Take 750 mg by mouth 2 (two) times daily as needed for muscle spasms.    [provider]    Allergies as of 03/24/2023 - Review Complete 03/24/2023  Allergen Reaction Noted   Codeine Rash, Hives, and Nausea And Vomiting 01/27/2017   Orphenadrine Nausea And Vomiting 10/07/2021   Other Rash 12/21/2022   Keflex [cephalexin] Rash 01/27/2017   Tramadol Nausea And Vomiting and Nausea Only 09/11/2018    Family History  Problem Relation Age of Onset   Melanoma Father    Graves' disease Child    Colon polyps Brother    Colon cancer Neg Hx     Social History   Socioeconomic History   Marital status: Married    Spouse name: Not on file   Number of children: Not on file   Years of education: Not on file   Highest education level: Not on file  Occupational History   Not on file  Tobacco Use   Smoking status: Former    Packs/day: 0.25    Years: 25.00    Additional pack years: 0.00    Total pack years: 6.25    Types: Cigars, Cigarettes    Quit date: 05/2022    Years since quitting: 0.9   Smokeless tobacco: Former   Tobacco comments:    1-2 cigars daily  Vaping  Use   Vaping Use: Some days  Substance and Sexual Activity   Alcohol use: Not Currently    Comment: 4-6 beers daily (decresed to 2 every other day -08/15/2020); quit January 2022   Drug use: Not Currently   Sexual activity: Yes    Birth control/protection: Surgical  Other Topics Concern   Not on file  Social History Narrative   Not on file   Social Determinants of Health   Financial Resource Strain: Not on file  Food Insecurity: Not on file  Transportation Needs: Not on file  Physical Activity: Not on file  Stress: Not on file  Social Connections: Not on file  Intimate Partner Violence: Not on file    Review of Systems: See HPI, otherwise negative ROS  Physical Exam: BP  135/82   Pulse 66   Temp 98 F (36.7 C) (Oral)   Resp 12   SpO2 99%  General:   Alert,  Well-developed, well-nourished, pleasant and cooperative in NAD Abdomen: Non-distended, normal bowel sounds.  Soft and nontender without appreciable mass or hepatosplenomegaly.  Pulses:  Normal pulses noted. Extremities:  Without clubbing or edema.  Impression/Plan:    62 year old lady with history of gastric ulcer biopsies negative for malignancy H. pylori lower last year surveillance EGD today per plan.  She got back on Protonix to once a day she had reflux flare wants to go back on twice a day I have offered the patient a  surveillance EGD.Marland Kitchen  The risks, benefits, limitations, alternatives and imponderables have been reviewed with the patient. Potential for esophageal dilation, biopsy, etc. have also been reviewed.  Questions have been answered. All parties agreeable.      Notice: This dictation was prepared with Dragon dictation along with smaller phrase technology. Any transcriptional errors that result from this process are unintentional and may not be corrected upon review.

## 2023-04-25 NOTE — Discharge Instructions (Signed)
EGD Discharge instructions Please read the instructions outlined below and refer to this sheet in the next few weeks. These discharge instructions provide you with general information on caring for yourself after you leave the hospital. Your doctor may also give you specific instructions. While your treatment has been planned according to the most current medical practices available, unavoidable complications occasionally occur. If you have any problems or questions after discharge, please call your doctor. ACTIVITY You may resume your regular activity but move at a slower pace for the next 24 hours.  Take frequent rest periods for the next 24 hours.  Walking will help expel (get rid of) the air and reduce the bloated feeling in your abdomen.  No driving for 24 hours (because of the anesthesia (medicine) used during the test).  You may shower.  Do not sign any important legal documents or operate any machinery for 24 hours (because of the anesthesia used during the test).  NUTRITION Drink plenty of fluids.  You may resume your normal diet.  Begin with a light meal and progress to your normal diet.  Avoid alcoholic beverages for 24 hours or as instructed by your caregiver.  MEDICATIONS You may resume your normal medications unless your caregiver tells you otherwise.  WHAT YOU CAN EXPECT TODAY You may experience abdominal discomfort such as a feeling of fullness or "gas" pains.  FOLLOW-UP Your doctor will discuss the results of your test with you.  SEEK IMMEDIATE MEDICAL ATTENTION IF ANY OF THE FOLLOWING OCCUR: Excessive nausea (feeling sick to your stomach) and/or vomiting.  Severe abdominal pain and distention (swelling).  Trouble swallowing.  Temperature over 101 F (37.8 C).  Rectal bleeding or vomiting of blood.       Previously noted gastric ulcer is completely healed!     You may increase Protonix to 40 mg twice daily 30 minutes before breakfast and supper to control your reflux  symptoms  Office visit with [place] in 5 months   at patient request, called  Lidia Collum at [phone] findings and recommendations

## 2023-04-25 NOTE — Op Note (Signed)
[facility] Patient Name: Nicole Ewing Procedure Date: 04/25/2023 12:55 PM MRN: [id number] Date of Birth: 1961-08-24 Attending MD: Gennette Pac , MD, [id number] CSN: [id number] Age: 62 Admit Type: Outpatient Procedure:                Upper GI endoscopy Indications:              Gastric ulcer Providers:                Gennette Pac, MD, Sheran Fava,                            Pandora Leiter, Technician Referring MD:              Medicines:                Propofol per Anesthesia Complications:            No immediate complications. Estimated Blood Loss:     Estimated blood loss: none. Procedure:                Pre-Anesthesia Assessment:                           - Prior to the procedure, a History and Physical                            was performed, and patient medications and                            allergies were reviewed. The patient's tolerance of                            previous anesthesia was also reviewed. The risks                            and benefits of the procedure and the sedation                            options and risks were discussed with the patient.                            All questions were answered, and informed consent                            was obtained. Prior Anticoagulants: The patient has                            taken no anticoagulant or antiplatelet agents. ASA                            Grade Assessment: III - A patient with severe                            systemic disease. After reviewing the risks and  benefits, the patient was deemed in satisfactory                            condition to undergo the procedure.                           After obtaining informed consent, the endoscope was                            passed under direct vision. Throughout the                            procedure, the patient's blood pressure, pulse, and                            oxygen saturations  were monitored continuously. The                            [id number]-[id number] ([id number]) scope was introduced through the                            mouth, and advanced to the second part of duodenum.                            The upper GI endoscopy was accomplished without                            difficulty. The patient tolerated the procedure                            well. Scope In: 1:07:23 PM Scope Out: 1:10:38 PM Total Procedure Duration: 0 hours 3 minutes 15 seconds  Findings:      The examined esophagus was normal.      Minimally polypoid proximal gastric mucosa (cardia). Previously noted       gastric ulcer completely healed. Pylorus patent.      The duodenal bulb and second portion of the duodenum were normal.       Estimated blood loss: none. Impression:               - Normal esophagus. Previously noted gastric ulcer                            completely healed.                           - Normal duodenal bulb and second portion of the                            duodenum.                           - No specimens collected. Moderate Sedation:      Moderate (conscious) sedation was personally administered by an       anesthesia professional. The following parameters were monitored: oxygen       saturation, heart rate,  blood pressure, respiratory rate, EKG, adequacy       of pulmonary ventilation, and response to care. Recommendation:           - Patient has a contact number available for                            emergencies. The signs and symptoms of potential                            delayed complications were discussed with the                            patient. Return to normal activities tomorrow.                            Written discharge instructions were provided to the                            patient.                           - Advance diet as tolerated.                           - Continue present medications.                           - Return to my office in  5 months. Patient states                            once daily Protonix has been associate with                            breakthrough reflux symptoms. I recommended she                            increase to 40 mg twice daily. Procedure Code(s):        --- Professional ---                           [phone], Esophagogastroduodenoscopy, flexible,                            transoral; diagnostic, including collection of                            specimen(s) by brushing or washing, when performed                            (separate procedure) Diagnosis Code(s):        --- Professional ---                           K25.9, Gastric ulcer, unspecified as acute or  chronic, without hemorrhage or perforation CPT copyright 2022 American Medical Association. All rights reserved. The codes documented in this report are preliminary and upon coder review may  be revised to meet current compliance requirements. Gerrit Friends. Jiana Lemaire, MD Gennette Pac, MD 04/25/2023 1:14:44 PM This report has been signed electronically. Number of Addenda: 0

## 2023-04-25 NOTE — Transfer of Care (Signed)
Immediate Anesthesia Transfer of Care Note  Patient: Nicole Ewing  Procedure(s) Performed: ESOPHAGOGASTRODUODENOSCOPY (EGD) WITH PROPOFOL  Patient Location: Endoscopy Unit  Anesthesia Type:General  Level of Consciousness: awake, alert , and oriented  Airway & Oxygen Therapy: Patient Spontanous Breathing  Post-op Assessment: Report given to RN, Post -op Vital signs reviewed and stable, Patient moving all extremities X 4, and Patient able to stick tongue midline  Post vital signs: Reviewed  Last Vitals:  Vitals Value Taken Time  BP 92/58 04/25/23 1315  Temp 36.6 C 04/25/23 1315  Pulse 64 04/25/23 1315  Resp 14 04/25/23 1315  SpO2 98 % 04/25/23 1315    Last Pain:  Vitals:   04/25/23 1315  TempSrc: Oral  PainSc: 0-No pain      Patients Stated Pain Goal: 5 (04/25/23 1243)  Complications: No notable events documented.

## 2023-04-25 NOTE — Anesthesia Postprocedure Evaluation (Signed)
Anesthesia Post Note  Patient: Nicole Ewing  Procedure(s) Performed: ESOPHAGOGASTRODUODENOSCOPY (EGD) WITH PROPOFOL  Patient location during evaluation: Phase II Anesthesia Type: General Level of consciousness: awake and alert and oriented Pain management: pain level controlled Vital Signs Assessment: post-procedure vital signs reviewed and stable Respiratory status: spontaneous breathing, nonlabored ventilation and respiratory function stable Cardiovascular status: blood pressure returned to baseline and stable Postop Assessment: no apparent nausea or vomiting Anesthetic complications: no  No notable events documented.   Last Vitals:  Vitals:   04/25/23 1244 04/25/23 1315  BP: 135/82 (!) 92/58  Pulse: 66 64  Resp: 12 14  Temp:  36.6 C  SpO2: 99% 98%    Last Pain:  Vitals:   04/25/23 1315  TempSrc: Oral  PainSc: 0-No pain                 Nicole Ewing C Nicole Ewing

## 2023-04-25 NOTE — Anesthesia Preprocedure Evaluation (Addendum)
Anesthesia Evaluation  Patient identified by MRN, date of birth, ID band Patient awake    Reviewed: Allergy & Precautions, H&P , NPO status , Patient's Chart, lab work & pertinent test results  Airway Mallampati: II  TM Distance: >3 FB Neck ROM: Full    Dental  (+) Dental Advisory Given, Missing, Poor Dentition   Pulmonary COPD, former smoker   Pulmonary exam normal breath sounds clear to auscultation       Cardiovascular negative cardio ROS Normal cardiovascular exam Rhythm:Regular Rate:Normal     Neuro/Psych  Headaches PSYCHIATRIC DISORDERS Anxiety Depression       GI/Hepatic Neg liver ROS,GERD  Medicated and Controlled,,  Endo/Other  Hypothyroidism    Renal/GU negative Renal ROS  negative genitourinary   Musculoskeletal  (+) Arthritis , Osteoarthritis,    Abdominal   Peds negative pediatric ROS (+)  Hematology  (+) Blood dyscrasia, anemia   Anesthesia Other Findings   Reproductive/Obstetrics negative OB ROS                             Anesthesia Physical Anesthesia Plan  ASA: 2  Anesthesia Plan: General   Post-op Pain Management:    Induction: Intravenous  PONV Risk Score and Plan: 1 and Propofol infusion  Airway Management Planned: Nasal Cannula and Natural Airway  Additional Equipment:   Intra-op Plan:   Post-operative Plan:   Informed Consent: I have reviewed the patients History and Physical, chart, labs and discussed the procedure including the risks, benefits and alternatives for the proposed anesthesia with the patient or authorized representative who has indicated his/her understanding and acceptance.     Dental advisory given  Plan Discussed with: CRNA and Surgeon  Anesthesia Plan Comments:         Anesthesia Quick Evaluation

## 2023-04-28 ENCOUNTER — Encounter (HOSPITAL_COMMUNITY): Payer: Self-pay | Admitting: Internal Medicine

## 2023-05-18 ENCOUNTER — Encounter: Payer: Self-pay | Admitting: *Deleted

## 2023-06-02 DIAGNOSIS — B078 Other viral warts: Secondary | ICD-10-CM | POA: Diagnosis not present

## 2023-06-02 DIAGNOSIS — D099 Carcinoma in situ, unspecified: Secondary | ICD-10-CM | POA: Diagnosis not present

## 2023-06-06 ENCOUNTER — Other Ambulatory Visit: Payer: Self-pay | Admitting: "Endocrinology

## 2023-06-07 DIAGNOSIS — D099 Carcinoma in situ, unspecified: Secondary | ICD-10-CM | POA: Diagnosis not present

## 2023-06-07 DIAGNOSIS — B078 Other viral warts: Secondary | ICD-10-CM | POA: Diagnosis not present

## 2023-06-08 DIAGNOSIS — D045 Carcinoma in situ of skin of trunk: Secondary | ICD-10-CM | POA: Diagnosis not present

## 2023-06-08 DIAGNOSIS — Z888 Allergy status to other drugs, medicaments and biological substances status: Secondary | ICD-10-CM | POA: Diagnosis not present

## 2023-06-08 DIAGNOSIS — E119 Type 2 diabetes mellitus without complications: Secondary | ICD-10-CM | POA: Diagnosis not present

## 2023-06-08 DIAGNOSIS — Z885 Allergy status to narcotic agent status: Secondary | ICD-10-CM | POA: Diagnosis not present

## 2023-06-08 DIAGNOSIS — Z87891 Personal history of nicotine dependence: Secondary | ICD-10-CM | POA: Diagnosis not present

## 2023-06-08 DIAGNOSIS — K6289 Other specified diseases of anus and rectum: Secondary | ICD-10-CM | POA: Diagnosis not present

## 2023-06-08 DIAGNOSIS — K219 Gastro-esophageal reflux disease without esophagitis: Secondary | ICD-10-CM | POA: Diagnosis not present

## 2023-06-08 DIAGNOSIS — L280 Lichen simplex chronicus: Secondary | ICD-10-CM | POA: Diagnosis not present

## 2023-06-08 DIAGNOSIS — Z79899 Other long term (current) drug therapy: Secondary | ICD-10-CM | POA: Diagnosis not present

## 2023-06-08 DIAGNOSIS — Z881 Allergy status to other antibiotic agents status: Secondary | ICD-10-CM | POA: Diagnosis not present

## 2023-06-08 DIAGNOSIS — J449 Chronic obstructive pulmonary disease, unspecified: Secondary | ICD-10-CM | POA: Diagnosis not present

## 2023-06-08 DIAGNOSIS — E039 Hypothyroidism, unspecified: Secondary | ICD-10-CM | POA: Diagnosis not present

## 2023-06-08 DIAGNOSIS — C44520 Squamous cell carcinoma of anal skin: Secondary | ICD-10-CM | POA: Diagnosis not present

## 2023-06-21 DIAGNOSIS — Z79891 Long term (current) use of opiate analgesic: Secondary | ICD-10-CM | POA: Diagnosis not present

## 2023-06-21 DIAGNOSIS — G8929 Other chronic pain: Secondary | ICD-10-CM | POA: Diagnosis not present

## 2023-06-21 DIAGNOSIS — M25562 Pain in left knee: Secondary | ICD-10-CM | POA: Diagnosis not present

## 2023-06-21 DIAGNOSIS — D099 Carcinoma in situ, unspecified: Secondary | ICD-10-CM | POA: Diagnosis not present

## 2023-06-21 DIAGNOSIS — M47816 Spondylosis without myelopathy or radiculopathy, lumbar region: Secondary | ICD-10-CM | POA: Diagnosis not present

## 2023-06-21 DIAGNOSIS — M25561 Pain in right knee: Secondary | ICD-10-CM | POA: Diagnosis not present

## 2023-06-23 DIAGNOSIS — D099 Carcinoma in situ, unspecified: Secondary | ICD-10-CM | POA: Diagnosis not present

## 2023-06-27 DIAGNOSIS — D485 Neoplasm of uncertain behavior of skin: Secondary | ICD-10-CM | POA: Diagnosis not present

## 2023-06-29 ENCOUNTER — Other Ambulatory Visit: Payer: Self-pay | Admitting: "Endocrinology

## 2023-07-08 DIAGNOSIS — E039 Hypothyroidism, unspecified: Secondary | ICD-10-CM | POA: Diagnosis not present

## 2023-07-08 DIAGNOSIS — Z86010 Personal history of colonic polyps: Secondary | ICD-10-CM | POA: Diagnosis not present

## 2023-07-08 DIAGNOSIS — M810 Age-related osteoporosis without current pathological fracture: Secondary | ICD-10-CM | POA: Diagnosis not present

## 2023-07-08 DIAGNOSIS — J449 Chronic obstructive pulmonary disease, unspecified: Secondary | ICD-10-CM | POA: Diagnosis not present

## 2023-07-08 DIAGNOSIS — D045 Carcinoma in situ of skin of trunk: Secondary | ICD-10-CM | POA: Diagnosis not present

## 2023-07-08 DIAGNOSIS — G8929 Other chronic pain: Secondary | ICD-10-CM | POA: Diagnosis not present

## 2023-07-08 DIAGNOSIS — C44529 Squamous cell carcinoma of skin of other part of trunk: Secondary | ICD-10-CM | POA: Diagnosis not present

## 2023-07-08 DIAGNOSIS — E782 Mixed hyperlipidemia: Secondary | ICD-10-CM | POA: Diagnosis not present

## 2023-07-08 DIAGNOSIS — Z79899 Other long term (current) drug therapy: Secondary | ICD-10-CM | POA: Diagnosis not present

## 2023-07-08 DIAGNOSIS — D013 Carcinoma in situ of anus and anal canal: Secondary | ICD-10-CM | POA: Diagnosis not present

## 2023-07-08 DIAGNOSIS — K219 Gastro-esophageal reflux disease without esophagitis: Secondary | ICD-10-CM | POA: Diagnosis not present

## 2023-07-08 DIAGNOSIS — L905 Scar conditions and fibrosis of skin: Secondary | ICD-10-CM | POA: Diagnosis not present

## 2023-07-08 DIAGNOSIS — Z7983 Long term (current) use of bisphosphonates: Secondary | ICD-10-CM | POA: Diagnosis not present

## 2023-07-08 DIAGNOSIS — E119 Type 2 diabetes mellitus without complications: Secondary | ICD-10-CM | POA: Diagnosis not present

## 2023-07-08 DIAGNOSIS — D649 Anemia, unspecified: Secondary | ICD-10-CM | POA: Diagnosis not present

## 2023-07-08 DIAGNOSIS — Z87891 Personal history of nicotine dependence: Secondary | ICD-10-CM | POA: Diagnosis not present

## 2023-07-13 ENCOUNTER — Other Ambulatory Visit (HOSPITAL_COMMUNITY)
Admission: RE | Admit: 2023-07-13 | Discharge: 2023-07-13 | Disposition: A | Discharge status: Home / Self Care | Payer: Medicare Other | Source: Ambulatory Visit | Attending: "Endocrinology | Admitting: "Endocrinology

## 2023-07-13 DIAGNOSIS — E890 Postprocedural hypothyroidism: Secondary | ICD-10-CM | POA: Diagnosis not present

## 2023-07-13 LAB — TSH: TSH: 34.602 u[IU]/mL — ABNORMAL HIGH (ref 0.350–4.500)

## 2023-07-13 LAB — T4, FREE: Free T4: 0.83 ng/dL (ref 0.61–1.12)

## 2023-07-19 ENCOUNTER — Ambulatory Visit: Payer: Medicare Other | Admitting: "Endocrinology

## 2023-07-19 ENCOUNTER — Encounter: Payer: Self-pay | Admitting: "Endocrinology

## 2023-07-19 VITALS — BP 110/66 | HR 60 | Ht 64.0 in | Wt 133.6 lb

## 2023-07-19 DIAGNOSIS — R7303 Prediabetes: Secondary | ICD-10-CM

## 2023-07-19 DIAGNOSIS — E890 Postprocedural hypothyroidism: Secondary | ICD-10-CM

## 2023-07-19 LAB — POCT GLYCOSYLATED HEMOGLOBIN (HGB A1C): HbA1c, POC (prediabetic range): 5.1 % — AB (ref 5.7–6.4)

## 2023-07-19 MED ORDER — LEVOTHYROXINE SODIUM 75 MCG PO TABS: 75.0000 ug | ORAL_TABLET | Freq: Every day | ORAL | 1 refills | Status: DC

## 2023-07-19 NOTE — Progress Notes (Signed)
07/19/2023, 6:37 PM                        Endocrinology follow-up note  Subjective:    Patient ID: Nicole Ewing, female    DOB: 1961-08-15, PCP Donetta Potts, MD   Past Medical History:  Diagnosis Date   Acid reflux    Anemia    Anxiety    Arthritis    Cervical cancer (HCC)    COPD (chronic obstructive pulmonary disease) (HCC)    Depression    H/O: hysterectomy    Cervical dysplasia   High cholesterol    Hyperthyroidism    Menopause ovarian failure    Migraines    Osteoporosis    Past Surgical History:  Procedure Laterality Date   ABDOMINAL HYSTERECTOMY     BIOPSY  03/19/2021   Procedure: BIOPSY;  Surgeon: Corbin Ade, MD;  [facility];  Service: Endoscopy;;   BIOPSY  10/11/2022   Procedure: BIOPSY;  Surgeon: Corbin Ade, MD;  Location: [facility];  Service: Endoscopy;;   BREAST SURGERY Right    breast buiopsy   COLONOSCOPY  04/06/2007   [facility]:  Normal rectum, no inflammation, no colonic or rectal polyps, tortuous and redundant colon. Recommendations to repeat TCS in 5 years (2013).    COLONOSCOPY WITH PROPOFOL N/A 02/07/2020   Procedure: COLONOSCOPY WITH PROPOFOL;  Surgeon: Corbin Ade, MD;  1 hyperplastic polyp. Otherwise normal exam. Recommended repeat in 5 years.    COLONOSCOPY WITH PROPOFOL N/A 10/11/2022   Surgeon: Corbin Ade, MD;   Hemorrhoids on perianal exam, nonbleeding external and internal hemorrhoids, otherwise normal exam.  Repeat in 10 years.   ESOPHAGOGASTRODUODENOSCOPY (EGD) WITH PROPOFOL N/A 03/19/2021   Surgeon: Corbin Ade, MD;  Normal esophagus s/p dilation and biopsy, patchy erythema of gastric mucosa particularly the body with a polypoid appearance s/p biopsy, normal examined duodenum.  Pathology with chronic mild gastritis, negative for H. pylori.  Esophageal biopsy benign.   ESOPHAGOGASTRODUODENOSCOPY (EGD) WITH PROPOFOL N/A 10/11/2022    Surgeon: Corbin Ade, MD; Normal esophagus, gastric ulcer with surrounding erosions biopsied, duodenal erosions biopsied. Gastric biopsy was negative for H. pylori, duodenal biopsy benign.   ESOPHAGOGASTRODUODENOSCOPY (EGD) WITH PROPOFOL N/A 04/25/2023   Procedure: ESOPHAGOGASTRODUODENOSCOPY (EGD) WITH PROPOFOL;  Surgeon: Corbin Ade, MD;  [facility];  Service: Endoscopy;  Laterality: N/A;  2:00 pm, ASA 2   MALONEY DILATION N/A 03/19/2021   Procedure: MALONEY DILATION;  Surgeon: Corbin Ade, MD;  [facility];  Service: Endoscopy;  Laterality: N/A;   POLYPECTOMY  02/07/2020   Procedure: POLYPECTOMY;  Surgeon: Corbin Ade, MD;  [facility];  Service: Endoscopy;;   TONSILLECTOMY     TOTAL HIP ARTHROPLASTY Left 11/16/2018   TOTAL HIP ARTHROPLASTY Right 07/02/2019   Social History   Socioeconomic History   Marital status: Married    Spouse name: Not on file   Number of children: Not on file   Years of education: Not on file   Highest education level: Not on file  Occupational History   Not on file  Tobacco Use   Smoking status:  Former    Current packs/day: 0.00    Average packs/day: 0.3 packs/day for 25.0 years (6.3 ttl pk-yrs)    Types: Cigars, Cigarettes    Start date: 05/1997    Quit date: 05/2022    Years since quitting: 1.2   Smokeless tobacco: Former   Tobacco comments:    1-2 cigars daily  Vaping Use   Vaping status: Some Days  Substance and Sexual Activity   Alcohol use: Not Currently    Comment: 4-6 beers daily (decresed to 2 every other day -08/15/2020); quit January 2022   Drug use: Not Currently   Sexual activity: Yes    Birth control/protection: Surgical  Other Topics Concern   Not on file  Social History Narrative   Not on file   Social Determinants of Health   Financial Resource Strain: Low Risk  (09/15/2022)   Received from [facility], [facility]   Overall Financial Resource Strain (CARDIA)     Difficulty of Paying Living Expenses: Not hard at all  Food Insecurity: No Food Insecurity (09/15/2022)   Received from [facility], [facility]   Hunger Vital Sign    Worried About Running Out of Food in the Last Year: Never true    Ran Out of Food in the Last Year: Never true  Transportation Needs: No Transportation Needs (09/15/2022)   Received from [facility], [facility]   [facility] - Transportation    Lack of Transportation (Medical): No    Lack of Transportation (Non-Medical): No  Physical Activity: Insufficiently Active (09/15/2022)   Received from [facility], [facility]   Exercise Vital Sign    Days of Exercise per Week: 3 days    Minutes of Exercise per Session: 40 min  Stress: No Stress Concern Present (09/15/2022)   Received from [facility], [facility] - Occupational Stress Questionnaire    Feeling of Stress : Only a little  Social Connections: Socially Integrated (09/15/2022)   Received from [facility], [facility]   Social Connection and Isolation Panel [NHANES]    Frequency of Communication with Friends and Family: More than three times a week    Frequency of Social Gatherings with Friends and Family: Twice a week    Attends Religious Services: More than 4 times per year    Active Member of [employer] or Organizations: Yes    Attends Banker Meetings: 1 to 4 times per year    Marital Status: Married   Family History  Problem Relation Age of Onset   Melanoma Father    Luiz Blare' disease Child    Colon polyps Brother    Colon cancer Neg Hx    Outpatient Encounter Medications as of 07/19/2023  Medication Sig   Blood Glucose Monitoring Suppl (ACCU-CHEK GUIDE ME) w/Device KIT 1 Piece by Does not apply route as directed.   Calcium Carbonate-Vitamin D (CALCIUM 600+D PO) Take 2 tablets by mouth daily. 1200mg  calcium, 1000mg  vit D   cetirizine (ZYRTEC) 10 MG tablet Take 10 mg by  mouth daily.   diazepam (VALIUM) 5 MG tablet Take 5 mg by mouth See admin instructions. Take 5 mg at bedtime, may take a 5 mg dose during the day as needed for anxiety   glucose blood (ACCU-CHEK GUIDE) test strip USE ONLY WHEN YOU GET SYMPTOMS OF LOW GLUCOSE   HYDROcodone-acetaminophen (NORCO/VICODIN) 5-325 MG tablet Take 1 tablet by mouth  2 (two) times daily as needed for moderate pain.   Lancets MISC 1 each by Does not apply route as directed.   levothyroxine (SYNTHROID) 75 MCG tablet Take 1 tablet (75 mcg total) by mouth daily before breakfast.   linaclotide (LINZESS) 290 MCG CAPS capsule Take 1 capsule (290 mcg total) by mouth daily before breakfast.   methocarbamol (ROBAXIN) 750 MG tablet Take 750 mg by mouth 2 (two) times daily as needed for muscle spasms.   Multiple Vitamin (MULTIVITAMIN WITH MINERALS) TABS tablet Take 1 tablet by mouth daily.   pantoprazole (PROTONIX) 40 MG tablet TAKE ONE TABLET BY MOUTH TWICE DAILY   Polyethyl Glycol-Propyl Glycol (SYSTANE ULTRA OP) Place 1 drop into both eyes daily as needed (dry eyes).   Probiotic Product (PROBIOTIC DAILY PO) Take 1 capsule by mouth daily.   vitamin B-12 (CYANOCOBALAMIN) 250 MCG tablet Take 250 mcg by mouth daily.   Zoledronic Acid (RECLAST IV) Inject into the vein. Once a year. Last had 10/07/21   [DISCONTINUED] levothyroxine (SYNTHROID) 50 MCG tablet TAKE ONE TABLET BY MOUTH ONCE DAILY BEFORE BREAKFAST   No facility-administered encounter medications on file as of 07/19/2023.   ALLERGIES: Allergies  Allergen Reactions   Codeine Rash, Hives and Nausea And Vomiting   Orphenadrine Nausea And Vomiting   Keflex [Cephalexin] Rash   Tramadol Nausea And Vomiting    Sick on stomach     VACCINATION STATUS: Immunization History  Administered Date(s) Administered   Covid-19, Mrna,Vaccine(Spikevax)79yrs and older 09/11/2022   Hepatitis B, ADULT 09/06/2019, 10/11/2019, 04/03/2020   PNEUMOCOCCAL CONJUGATE-20 05/25/2022   Td (Adult), 2  Lf Tetanus Toxid, Preservative Free 09/27/1995   Tdap 03/19/2015   Zoster Recombinant(Shingrix) 11/16/2021, 01/19/2022    HPI MADISUN STUCKMAN is 62 y.o. female who is returning to follow-up for her RAI induced hypothyroidism.   -After workup for hyperthyroidism showed a hyperfunctioning adenoma on the left lobe of her thyroid, she was given radioactive iodine thyroid ablation which she received on December 14, 2022.   She was initiated on levothyroxine 50 mcg p.o. daily before breakfast during her last visit.  Her previsit labs show improvement, however still consistent with under replacement.     She has no new complaints today. She previously underwent went fine-needle aspiration of a cold nodule in the thyroid with benign outcomes. Her previous symptoms of palpitations, tremors, heat intolerance have largely resolved.     She does have family history of thyroid dysfunction in her daughter.   She denies dysphagia, shortness of breath, nor voice change. She admits to upto 6 beers daily.  Review of Systems Limited as above.  Objective:    BP 110/66   Pulse 60   Ht 5\' 4"  (1.626 m)   Wt 133 lb 9.6 oz (60.6 kg)   BMI 22.93 kg/m   Wt Readings from Last 3 Encounters:  07/19/23 133 lb 9.6 oz (60.6 kg)  04/12/23 138 lb 3.2 oz (62.7 kg)  03/24/23 138 lb 9.6 oz (62.9 kg)    Physical Exam  CMP ( most recent) CMP     Component Value Date/Time   NA 129 (L) 04/05/2023 1436   NA 139 08/18/2022 0000   K 4.0 04/05/2023 1436   CL 96 (L) 04/05/2023 1436   CO2 23 04/05/2023 1436   GLUCOSE 110 (H) 04/05/2023 1436   BUN 10 04/05/2023 1436   BUN 8 08/18/2022 0000   CREATININE 0.71 04/05/2023 1436   CREATININE 0.43 (L) 06/05/2019 1446   CALCIUM 8.8 (  L) 04/05/2023 1436   PROT 7.1 04/05/2023 1436   ALBUMIN 4.0 04/05/2023 1436   AST 26 04/05/2023 1436   ALT 19 04/05/2023 1436   ALKPHOS 49 04/05/2023 1436   BILITOT 0.5 04/05/2023 1436   GFRNONAA >60 04/05/2023 1436   GFRNONAA 112  06/05/2019 1446   GFRAA >60 12/04/2019 1353   GFRAA 130 06/05/2019 1446    Lipid Panel     Component Value Date/Time   CHOL 242 (A) 08/18/2022 0000   TRIG 62 08/18/2022 0000   HDL 107 (A) 08/18/2022 0000   LDLCALC 123 08/18/2022 0000      Lab Results  Component Value Date   TSH 34.602 (H) 07/13/2023   TSH 31.308 (H) 04/05/2023   TSH 0.594 02/24/2023   TSH <0.010 (L) 01/18/2023   TSH 0.21 (A) 08/18/2022   TSH 0.597 02/24/2022   TSH 0.699 08/26/2021   TSH 0.593 02/23/2021   TSH 1.110 08/28/2020   TSH 0.543 02/25/2020   FREET4 0.83 07/13/2023   FREET4 0.52 (L) 04/05/2023   FREET4 0.87 02/24/2023   FREET4 1.84 (H) 01/18/2023   FREET4 0.88 02/24/2022   FREET4 0.78 08/26/2021   FREET4 0.86 02/23/2021   FREET4 0.75 08/28/2020   FREET4 0.69 02/25/2020   FREET4 0.7 (L) 09/26/2019      Assessment & Plan:   1.  RAI induced hypothyroidism 2.toxic adenoma-status post RAI-resolved.    I discussed her previsit labs with her.  Her labs are consistent with slight under replacement.  I discussed and increase her levothyroxine to 75 mcg p.o. daily before breakfast.   - We discussed about the correct intake of her thyroid hormone, on empty stomach at fasting, with water, separated by at least 30 minutes from breakfast and other medications,  and separated by more than 4 hours from calcium, iron, multivitamins, acid reflux medications (PPIs). -Patient is made aware of the fact that thyroid hormone replacement is needed for life, dose to be adjusted by periodic monitoring of thyroid function tests.   -She has a negative fine-needle aspiration of a thyroid nodule.   She was counseled on smoking cessation last visit, presents with successful quitting. She has high risk for relapse.  She has subacute mild hyponatremia.  She will be considered for a.m. cortisol during her subsequent visits.  - I advised her  to maintain close follow up with Donetta Potts, MD for primary care  needs.  I spent  21  minutes in the care of the patient today including review of labs from Thyroid Function, CMP, and other relevant labs ; imaging/biopsy records (current and previous including abstractions from other facilities); face-to-face time discussing  her lab results and symptoms, medications doses, her options of short and long term treatment based on the latest standards of care / guidelines;   and documenting the encounter.  Andee Poles Kagan  participated in the discussions, expressed understanding, and voiced agreement with the above plans.  All questions were answered to her satisfaction. she is encouraged to contact clinic should she have any questions or concerns prior to her return visit.   Follow up plan: Return in about 6 months (around 01/19/2024) for Fasting Labs  in AM B4 8.   Marquis Lunch, MD [facility] [address] [place], [place] [address] Phone: [phone]  Fax: [phone]     07/19/2023, 6:37 PM  This note was partially dictated with voice recognition software. Similar sounding words can be transcribed inadequately or  may not  be corrected upon review.

## 2023-07-25 ENCOUNTER — Encounter: Payer: Self-pay | Admitting: "Endocrinology

## 2023-07-25 ENCOUNTER — Other Ambulatory Visit: Payer: Self-pay | Admitting: Gastroenterology

## 2023-07-25 ENCOUNTER — Telehealth: Payer: Self-pay

## 2023-07-25 DIAGNOSIS — D049 Carcinoma in situ of skin, unspecified: Secondary | ICD-10-CM | POA: Diagnosis not present

## 2023-07-25 DIAGNOSIS — K219 Gastro-esophageal reflux disease without esophagitis: Secondary | ICD-10-CM

## 2023-07-25 DIAGNOSIS — Z09 Encounter for follow-up examination after completed treatment for conditions other than malignant neoplasm: Secondary | ICD-10-CM | POA: Diagnosis not present

## 2023-07-25 MED ORDER — PANTOPRAZOLE SODIUM 40 MG PO TBEC
40.0000 mg | DELAYED_RELEASE_TABLET | Freq: Two times a day (BID) | ORAL | 3 refills | Status: DC
Start: 2023-07-25 — End: 2023-10-21

## 2023-07-25 NOTE — Telephone Encounter (Signed)
Pt is aware.  

## 2023-07-25 NOTE — Telephone Encounter (Signed)
Pt called and is needing refills on her pantoprazole. Pt was last seen on 03/24/2023.

## 2023-07-25 NOTE — Telephone Encounter (Signed)
Rx sent 

## 2023-08-15 ENCOUNTER — Encounter: Payer: Self-pay | Admitting: Internal Medicine

## 2023-08-30 DIAGNOSIS — E0590 Thyrotoxicosis, unspecified without thyrotoxic crisis or storm: Secondary | ICD-10-CM | POA: Diagnosis not present

## 2023-08-30 DIAGNOSIS — E559 Vitamin D deficiency, unspecified: Secondary | ICD-10-CM | POA: Diagnosis not present

## 2023-08-30 DIAGNOSIS — I10 Essential (primary) hypertension: Secondary | ICD-10-CM | POA: Diagnosis not present

## 2023-08-30 DIAGNOSIS — R7303 Prediabetes: Secondary | ICD-10-CM | POA: Diagnosis not present

## 2023-08-30 DIAGNOSIS — F17200 Nicotine dependence, unspecified, uncomplicated: Secondary | ICD-10-CM | POA: Diagnosis not present

## 2023-08-30 DIAGNOSIS — K760 Fatty (change of) liver, not elsewhere classified: Secondary | ICD-10-CM | POA: Diagnosis not present

## 2023-09-07 DIAGNOSIS — Z6821 Body mass index (BMI) 21.0-21.9, adult: Secondary | ICD-10-CM | POA: Diagnosis not present

## 2023-09-07 DIAGNOSIS — M810 Age-related osteoporosis without current pathological fracture: Secondary | ICD-10-CM | POA: Diagnosis not present

## 2023-09-07 DIAGNOSIS — G8929 Other chronic pain: Secondary | ICD-10-CM | POA: Diagnosis not present

## 2023-09-07 DIAGNOSIS — R1310 Dysphagia, unspecified: Secondary | ICD-10-CM | POA: Diagnosis not present

## 2023-09-07 DIAGNOSIS — Z0001 Encounter for general adult medical examination with abnormal findings: Secondary | ICD-10-CM | POA: Diagnosis not present

## 2023-09-07 DIAGNOSIS — F17200 Nicotine dependence, unspecified, uncomplicated: Secondary | ICD-10-CM | POA: Diagnosis not present

## 2023-09-07 DIAGNOSIS — D649 Anemia, unspecified: Secondary | ICD-10-CM | POA: Diagnosis not present

## 2023-09-07 DIAGNOSIS — K760 Fatty (change of) liver, not elsewhere classified: Secondary | ICD-10-CM | POA: Diagnosis not present

## 2023-09-14 NOTE — Progress Notes (Unsigned)
Referring Provider: Donetta Potts, MD Primary Care Physician:  Donetta Potts, MD Primary GI Physician: Dr. Jena Gauss  Chief Complaint  Patient presents with   Follow-up    Follow up. Stays tired all the time.    HPI:   Nicole Ewing is a 62 y.o. female  with history of GERD, dysphagia resolved with empiric dilation, elevated LFTs (normalized 2020/2021), previously with daily alcohol use (abstinent since January 2022), history of intranasal drug use in her 10s, constipation, unintentional weight loss between 2020-2022, IDA in 2023 s/p EGD and colonoscopy 10/2022 with gastric ulcer and duodenal erosions on EGD in the setting of BC powders, external and internal hemorrhoids on colonoscopy. She has been following with hematology and received IV iron previously.  She is presenting today for follow-up  Last seen in our office 03/24/2023.  GERD well-controlled on pantoprazole twice daily.  Constipation well-controlled on Linzess 290 mcg daily.  No overt GI bleeding.  She had discontinued BC powder use and was taking Tylenol only.  She was overdue for surveillance EGD.  Last iron infusion was October 2023.  Most recent hemoglobin and iron panel were within normal limits in March 2024.  LFTs also remained within normal limits in March.  Recommended proceeding with surveillance EGD, decrease pantoprazole to 40 mg daily, continue Linzess, follow-up in 6 months.  EGD 04/25/2023: Entirely normal exam.  Prior gastric ulcer completely healed.  Recommended increasing pantoprazole back to 40 mg twice daily due to breakthrough GERD.  In the interim, patient was found to have squamous cell carcinoma of her right buttock.  Today:  GERD: Well controlled on PPI BID. Noted some discomfort in epigastric area this morning. Not a routine thing.   Elevated LFTs: Need to update labs No ETOH Has changed her diet. Limiting sugars per Dr. Fransico Him and eating more fiber.  Has lost about 10 lbs with dietary  changes.   IDA: Last iron infusion October 2023.  Hemoglobin within normal limits at 12.2 on 06/07/2023.  Reports having labs completed today with [facility] in [place].  No NSAIDs. Stays tired all the time.   Constipation: Linzess too expensive.  Taking 1 senna nightly and changed diet. Eating more fruits, nuts, grains, etc. Bowels are moving well.    Past Medical History:  Diagnosis Date   Acid reflux    Anemia    Anxiety    Arthritis    Cervical cancer (HCC)    COPD (chronic obstructive pulmonary disease) (HCC)    Depression    H/O: hysterectomy    Cervical dysplasia   High cholesterol    Hyperthyroidism    Menopause ovarian failure    Migraines    Osteoporosis     Past Surgical History:  Procedure Laterality Date   ABDOMINAL HYSTERECTOMY     BIOPSY  03/19/2021   Procedure: BIOPSY;  Surgeon: Corbin Ade, MD;  [facility];  Service: Endoscopy;;   BIOPSY  10/11/2022   Procedure: BIOPSY;  Surgeon: Corbin Ade, MD;  [facility];  Service: Endoscopy;;   BREAST SURGERY Right    breast buiopsy   COLONOSCOPY  04/06/2007   [facility]:  Normal rectum, no inflammation, no colonic or rectal polyps, tortuous and redundant colon. Recommendations to repeat TCS in 5 years (2013).    COLONOSCOPY WITH PROPOFOL N/A 02/07/2020   Procedure: COLONOSCOPY WITH PROPOFOL;  Surgeon: Corbin Ade, MD;  1 hyperplastic polyp. Otherwise normal exam. Recommended repeat in 5  years.    COLONOSCOPY WITH PROPOFOL N/A 10/11/2022   Surgeon: Corbin Ade, MD;   Hemorrhoids on perianal exam, nonbleeding external and internal hemorrhoids, otherwise normal exam.  Repeat in 10 years.   ESOPHAGOGASTRODUODENOSCOPY (EGD) WITH PROPOFOL N/A 03/19/2021   Surgeon: Corbin Ade, MD;  Normal esophagus s/p dilation and biopsy, patchy erythema of gastric mucosa particularly the body with a polypoid appearance s/p biopsy, normal examined duodenum.  Pathology with chronic  mild gastritis, negative for H. pylori.  Esophageal biopsy benign.   ESOPHAGOGASTRODUODENOSCOPY (EGD) WITH PROPOFOL N/A 10/11/2022   Surgeon: Corbin Ade, MD; Normal esophagus, gastric ulcer with surrounding erosions biopsied, duodenal erosions biopsied. Gastric biopsy was negative for H. pylori, duodenal biopsy benign.   ESOPHAGOGASTRODUODENOSCOPY (EGD) WITH PROPOFOL N/A 04/25/2023   Procedure: ESOPHAGOGASTRODUODENOSCOPY (EGD) WITH PROPOFOL;  Surgeon: Corbin Ade, MD;  [facility];  Service: Endoscopy;  Laterality: N/A;  2:00 pm, ASA 2   MALONEY DILATION N/A 03/19/2021   Procedure: MALONEY DILATION;  Surgeon: Corbin Ade, MD;  [facility];  Service: Endoscopy;  Laterality: N/A;   POLYPECTOMY  02/07/2020   Procedure: POLYPECTOMY;  Surgeon: Corbin Ade, MD;  [facility];  Service: Endoscopy;;   TONSILLECTOMY     TOTAL HIP ARTHROPLASTY Left 11/16/2018   TOTAL HIP ARTHROPLASTY Right 07/02/2019    Current Outpatient Medications  Medication Sig Dispense Refill   Blood Glucose Monitoring Suppl (ACCU-CHEK GUIDE ME) w/Device KIT 1 Piece by Does not apply route as directed. 1 kit 0   Calcium Carbonate-Vitamin D (CALCIUM 600+D PO) Take 2 tablets by mouth daily. 1200mg  calcium, 1000mg  vit D     cetirizine (ZYRTEC) 10 MG tablet Take 10 mg by mouth daily.     diazepam (VALIUM) 5 MG tablet Take 5 mg by mouth See admin instructions. Take 5 mg at bedtime, may take a 5 mg dose during the day as needed for anxiety     glucose blood (ACCU-CHEK GUIDE) test strip USE ONLY WHEN YOU GET SYMPTOMS OF LOW GLUCOSE 50 strip 2   Lancets MISC 1 each by Does not apply route as directed. 100 each 3   levothyroxine (SYNTHROID) 75 MCG tablet Take 1 tablet (75 mcg total) by mouth daily before breakfast. 90 tablet 1   Multiple Vitamin (MULTIVITAMIN WITH MINERALS) TABS tablet Take 1 tablet by mouth daily.     pantoprazole (PROTONIX) 40 MG tablet Take 1 tablet (40 mg total) by  mouth 2 (two) times daily. 60 tablet 3   Polyethyl Glycol-Propyl Glycol (SYSTANE ULTRA OP) Place 1 drop into both eyes daily as needed (dry eyes).     Probiotic Product (PROBIOTIC DAILY PO) Take 1 capsule by mouth daily.     senna (SENOKOT) 8.6 MG tablet Take 1 tablet by mouth daily.     vitamin B-12 (CYANOCOBALAMIN) 250 MCG tablet Take 250 mcg by mouth daily.     Zoledronic Acid (RECLAST IV) Inject into the vein. Once a year. Last had 10/07/21     HYDROcodone-acetaminophen (NORCO/VICODIN) 5-325 MG tablet Take 1 tablet by mouth 2 (two) times daily as needed for moderate pain. (Patient not taking: Reported on 09/15/2023)     methocarbamol (ROBAXIN) 750 MG tablet Take 750 mg by mouth 2 (two) times daily as needed for muscle spasms. (Patient not taking: Reported on 09/15/2023)     No current facility-administered medications for this visit.    Allergies as of 09/15/2023 - Review Complete 09/15/2023  Allergen Reaction  Noted   Codeine Rash, Hives, and Nausea And Vomiting 01/27/2017   Orphenadrine Nausea And Vomiting 10/07/2021   Keflex [cephalexin] Rash 01/27/2017   Tramadol Nausea And Vomiting 09/11/2018    Family History  Problem Relation Age of Onset   Melanoma Father    Graves' disease Child    Colon polyps Brother    Colon cancer Neg Hx     Social History   Socioeconomic History   Marital status: Married    Spouse name: Not on file   Number of children: Not on file   Years of education: Not on file   Highest education level: Not on file  Occupational History   Not on file  Tobacco Use   Smoking status: Former    Current packs/day: 0.00    Average packs/day: 0.3 packs/day for 25.0 years (6.3 ttl pk-yrs)    Types: Cigars, Cigarettes    Start date: 05/1997    Quit date: 05/2022    Years since quitting: 1.3   Smokeless tobacco: Former   Tobacco comments:    1-2 cigars daily  Vaping Use   Vaping status: Some Days  Substance and Sexual Activity   Alcohol use: Not  Currently    Comment: 4-6 beers daily (decresed to 2 every other day -08/15/2020); quit January 2022   Drug use: Not Currently   Sexual activity: Yes    Birth control/protection: Surgical  Other Topics Concern   Not on file  Social History Narrative   Not on file   Social Determinants of Health   Financial Resource Strain: Low Risk  (09/15/2022)   Received from [facility], [facility]   Overall Financial Resource Strain (CARDIA)    Difficulty of Paying Living Expenses: Not hard at all  Food Insecurity: No Food Insecurity (09/15/2022)   Received from [facility], [facility]   Hunger Vital Sign    Worried About Running Out of Food in the Last Year: Never true    Ran Out of Food in the Last Year: Never true  Transportation Needs: No Transportation Needs (09/15/2022)   Received from [facility], [facility]   [facility] - Transportation    Lack of Transportation (Medical): No    Lack of Transportation (Non-Medical): No  Physical Activity: Insufficiently Active (09/15/2022)   Received from [facility], [facility] [place]   Exercise Vital Sign    Days of Exercise per Week: 3 days    Minutes of Exercise per Session: 40 min  Stress: No Stress Concern Present (09/15/2022)   Received from [facility], [facility] - Occupational Stress Questionnaire    Feeling of Stress : Only a little  Social Connections: Socially Integrated (09/15/2022)   Received from [facility], [facility]   Social Connection and Isolation Panel [NHANES]    Frequency of Communication with Friends and Family: More than three times a week    Frequency of Social Gatherings with Friends and Family: Twice a week    Attends Religious Services: More than 4 times per year    Active Member of [employer] or Organizations: Yes    Attends Banker Meetings: 1 to 4 times per year    Marital Status: Married    Review of Systems: Gen:  Denies fever, chills, cold or flulike symptoms, presyncope, syncope. CV: Denies chest pain, palpitations.  Resp: Denies dyspnea, cough. GI: See HPI  Heme: See  HPI  Physical Exam: BP 116/80 (BP Location: Right Arm, Patient Position: Sitting, Cuff Size: Normal)   Pulse 69   Temp 97.7 F (36.5 C) (Temporal)   Ht 5\' 4"  (1.626 m)   Wt 128 lb (58.1 kg)   BMI 21.97 kg/m  General:   Alert and oriented. No distress noted. Pleasant and cooperative.  Head:  Normocephalic and atraumatic. Eyes:  Conjuctiva clear without scleral icterus. Heart:  S1, S2 present without murmurs appreciated. Lungs:  Clear to auscultation bilaterally. No wheezes, rales, or rhonchi. No distress.  Abdomen:  +BS, soft, non-tender and non-distended. No rebound or guarding. No HSM or masses noted. Msk:  Symmetrical without gross deformities. Normal posture. Extremities:  Without edema. Neurologic:  Alert and  oriented x4 Psych:  Normal mood and affect.    Assessment:  62 y.o. female  with history of GERD, dysphagia resolved with empiric dilation, elevated LFTs (normalized 2020/2021), previously with daily alcohol use (abstinent since January 2022), history of intranasal drug use in her 36s, constipation, unintentional weight loss between 2020-2022, IDA in 2023 s/p EGD and colonoscopy 10/2022 with gastric ulcer and duodenal erosions on EGD in the setting of BC powders, external and internal hemorrhoids on colonoscopy. She has been following with hematology and received IV iron previously.  She is presenting today for follow-up.   IDA:  Likely secondary to peptic ulcer disease in the setting of BC powders. Surveillance EGD completed May 2024 with prior peptic ulcer completely healed.  Hemoglobin returned to normal in March 2024.  Most recent hemoglobin on file 12.2 in July 2024.  Patient reports having labs completed at [facility] in [place] today.  We will request these for review.  She is not on iron and last received  iron infusion in October 2023.  As long as hemoglobin and iron levels remain within normal limits, no need to pursue givens capsule.  Fatigue: May be related to hypothyroidism.  Last TSH in August 34.6.  Unable to rule out recurrent anemia.  I am requesting labs completed today from [facility] for review.  Recommended follow-up with Dr. Fransico Him for hypothyroidism.  GERD:  Well-controlled on pantoprazole 40 mg twice daily.  Noted some mild discomfort in the epigastric area today.  Reinforced GERD diet/lifestyle.  Constipation: Previously well-controlled on Linzess 290 mcg daily, but reports this was too expensive.  She has since changed her diet, increase fiber intake, and start taking senna nightly and is now having good, productive bowel movements regularly.  History of elevated LFTs: Chronic.  Likely secondary to pain medications though she has reduced the amount of pain medication needed on a regular basis.  Had previously done well with Linzess 290 mcg, but this medication ultimately became too expensive for her.  She is now doing well on senna nightly along with dietary changes including increased fiber intake.    Plan:  HFP Request labs completed today from [facility] in [place] Will need to consider givens capsule if hemoglobin/iron declines.  Continue pantoprazole 40 mg twice daily Reinforced GERD diet/lifestyle. Continue senna nightly along with dietary changes to control constipation. Recommended follow-up with Dr. Fransico Him regarding hypothyroidism. Follow-up in our office in 6 months or sooner if needed.   Ermalinda Memos, PA-C [facility] 09/15/2023

## 2023-09-15 ENCOUNTER — Encounter: Payer: Self-pay | Admitting: Gastroenterology

## 2023-09-15 ENCOUNTER — Other Ambulatory Visit (HOSPITAL_COMMUNITY)
Admission: RE | Admit: 2023-09-15 | Discharge: 2023-09-15 | Disposition: A | Discharge status: Home / Self Care | Payer: Medicare Other | Source: Ambulatory Visit | Attending: Gastroenterology | Admitting: Gastroenterology

## 2023-09-15 ENCOUNTER — Ambulatory Visit: Payer: Medicare Other | Admitting: Gastroenterology

## 2023-09-15 VITALS — BP 116/80 | HR 69 | Temp 97.7°F | Ht 64.0 in | Wt 128.0 lb

## 2023-09-15 DIAGNOSIS — K5900 Constipation, unspecified: Secondary | ICD-10-CM | POA: Diagnosis not present

## 2023-09-15 DIAGNOSIS — D509 Iron deficiency anemia, unspecified: Secondary | ICD-10-CM | POA: Diagnosis not present

## 2023-09-15 DIAGNOSIS — K219 Gastro-esophageal reflux disease without esophagitis: Secondary | ICD-10-CM | POA: Diagnosis not present

## 2023-09-15 DIAGNOSIS — R7989 Other specified abnormal findings of blood chemistry: Secondary | ICD-10-CM | POA: Insufficient documentation

## 2023-09-15 DIAGNOSIS — R5383 Other fatigue: Secondary | ICD-10-CM | POA: Diagnosis not present

## 2023-09-15 DIAGNOSIS — D500 Iron deficiency anemia secondary to blood loss (chronic): Secondary | ICD-10-CM | POA: Diagnosis not present

## 2023-09-15 LAB — HEPATIC FUNCTION PANEL
ALT: 12 U/L (ref 0–44)
AST: 21 U/L (ref 15–41)
Albumin: 4.1 g/dL (ref 3.5–5.0)
Alkaline Phosphatase: 40 U/L (ref 38–126)
Bilirubin, Direct: <0.1 mg/dL (ref 0.0–0.2)
Total Bilirubin: 0.4 mg/dL (ref 0.3–1.2)
Total Protein: 7.3 g/dL (ref 6.5–8.1)

## 2023-09-15 NOTE — Patient Instructions (Addendum)
Please have labs completed at [facility].   I am requesting labs from Hematology/Cancer center.   Continue pantoprazole twice a day before breakfast and dinner.   Follow a GERD diet/lifestyle:  Avoid fried, fatty, greasy, spicy, citrus foods. Avoid caffeine and carbonated beverages. Avoid chocolate. Try eating 4-6 small meals a day rather than 3 large meals. Do not eat within 3 hours of laying down. Prop head of bed up on wood or bricks to create a 6 inch incline.  Continue senna nightly along with increased fiber intake.   We will plan to see you back in 6 month or sooner if needed.    Ermalinda Memos, PA-C [facility]

## 2023-09-21 DIAGNOSIS — G894 Chronic pain syndrome: Secondary | ICD-10-CM | POA: Diagnosis not present

## 2023-09-21 DIAGNOSIS — Z79891 Long term (current) use of opiate analgesic: Secondary | ICD-10-CM | POA: Diagnosis not present

## 2023-09-21 DIAGNOSIS — M47816 Spondylosis without myelopathy or radiculopathy, lumbar region: Secondary | ICD-10-CM | POA: Diagnosis not present

## 2023-09-22 DIAGNOSIS — D500 Iron deficiency anemia secondary to blood loss (chronic): Secondary | ICD-10-CM | POA: Diagnosis not present

## 2023-09-26 ENCOUNTER — Telehealth: Payer: Self-pay | Admitting: Gastroenterology

## 2023-09-26 NOTE — Telephone Encounter (Signed)
Please let patient know I received her labs from [facility] center on 10/17.  Hemoglobin is slightly low at 11.1, down from 12 range.  Her iron levels are low normal and have been slowly declining.  Considering this, I would recommend that we go ahead and complete givens capsule for further evaluation of iron deficiency anemia.    Mindy/Tammy:  Please arrange Given's. Dx: IDA No oral iron x10 days prior if this is resumed at any point.

## 2023-09-27 NOTE — Telephone Encounter (Signed)
Pt aware of results/recs. She stated this morning her stool was dark black and has happened before. Reports not taking any Iron supplements.   She has been scheduled for GIVENS CAPSULE 11/11. Aware will send instructions via mychart

## 2023-09-30 DIAGNOSIS — Z1231 Encounter for screening mammogram for malignant neoplasm of breast: Secondary | ICD-10-CM | POA: Diagnosis not present

## 2023-10-05 DIAGNOSIS — M810 Age-related osteoporosis without current pathological fracture: Secondary | ICD-10-CM | POA: Diagnosis not present

## 2023-10-15 ENCOUNTER — Other Ambulatory Visit: Payer: Self-pay | Admitting: "Endocrinology

## 2023-10-17 ENCOUNTER — Encounter (HOSPITAL_COMMUNITY)
Admission: RE | Disposition: A | Discharge status: Home / Self Care | Payer: Self-pay | Source: Home / Self Care | Attending: Internal Medicine

## 2023-10-17 ENCOUNTER — Ambulatory Visit (HOSPITAL_COMMUNITY)
Admission: RE | Admit: 2023-10-17 | Discharge: 2023-10-17 | Disposition: A | Discharge status: Home / Self Care | Payer: Medicare Other | Attending: Internal Medicine | Admitting: Internal Medicine

## 2023-10-17 DIAGNOSIS — D509 Iron deficiency anemia, unspecified: Secondary | ICD-10-CM | POA: Insufficient documentation

## 2023-10-17 DIAGNOSIS — K6389 Other specified diseases of intestine: Secondary | ICD-10-CM | POA: Diagnosis not present

## 2023-10-17 HISTORY — PX: GIVENS CAPSULE STUDY: SHX5432

## 2023-10-17 SURGERY — IMAGING PROCEDURE, GI TRACT, INTRALUMINAL, VIA CAPSULE

## 2023-10-19 DIAGNOSIS — D500 Iron deficiency anemia secondary to blood loss (chronic): Secondary | ICD-10-CM | POA: Diagnosis not present

## 2023-10-20 ENCOUNTER — Encounter (HOSPITAL_COMMUNITY): Payer: Self-pay | Admitting: Internal Medicine

## 2023-10-21 ENCOUNTER — Other Ambulatory Visit: Payer: Self-pay | Admitting: Gastroenterology

## 2023-10-21 DIAGNOSIS — Z87891 Personal history of nicotine dependence: Secondary | ICD-10-CM | POA: Diagnosis not present

## 2023-10-21 DIAGNOSIS — F17210 Nicotine dependence, cigarettes, uncomplicated: Secondary | ICD-10-CM | POA: Diagnosis not present

## 2023-10-21 DIAGNOSIS — Z122 Encounter for screening for malignant neoplasm of respiratory organs: Secondary | ICD-10-CM | POA: Diagnosis not present

## 2023-10-21 DIAGNOSIS — K219 Gastro-esophageal reflux disease without esophagitis: Secondary | ICD-10-CM

## 2023-10-21 DIAGNOSIS — D649 Anemia, unspecified: Secondary | ICD-10-CM | POA: Diagnosis not present

## 2023-10-24 DIAGNOSIS — M810 Age-related osteoporosis without current pathological fracture: Secondary | ICD-10-CM | POA: Diagnosis not present

## 2023-10-26 DIAGNOSIS — D500 Iron deficiency anemia secondary to blood loss (chronic): Secondary | ICD-10-CM | POA: Diagnosis not present

## 2023-10-27 NOTE — Op Note (Cosign Needed)
Small Bowel Givens Capsule Study Procedure date:  10/16/20  Referring Provider:  Ermalinda Memos, PA-C PCP:  Dr. Mayford Knife Nicole Riser, MD  Indication for procedure:  IDA  Patient data:  Wt: 55.5 kg Ht: 5\' 4"    Findings:   Several yellow to bright white areas in the proximal small bowel with examples at 00:43:13, 00:43:53, 00:54:19, 01:02:59, 01:11:28.  These may very well be cholesterol cyst and/or lymphangiectasia variants.  Submucosal mass with surface of small bowel appearing smooth with noted at 02:12:41 with a different view of likely the same lesion at 02:16:09.  No obvious bleeding lesions, AVMs, or other small bowel lesions.  Study was complete to the cecum without overt GI bleeding.  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 - Submucosal mass of uncertain etiology in the mid small bowel around 02:12:41. - No other significant abnormalities. - Study complete to the cecum.  Recommendations: CT enterography to evaluate submucosal mass. Continue following with hematology for IDA.     Nicole Memos, PA-C [facility]  Images:        attending note:   Agree with findings.  Pertinent images reviewed.

## 2023-11-01 ENCOUNTER — Telehealth: Payer: Medicare Other | Admitting: Gastroenterology

## 2023-11-01 ENCOUNTER — Encounter: Payer: Self-pay | Admitting: *Deleted

## 2023-11-01 DIAGNOSIS — D490 Neoplasm of unspecified behavior of digestive system: Secondary | ICD-10-CM

## 2023-11-01 DIAGNOSIS — D509 Iron deficiency anemia, unspecified: Secondary | ICD-10-CM

## 2023-11-01 NOTE — Telephone Encounter (Signed)
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 These are benign findings.  - Appears to have a submucosal bulge/mass of uncertain etiology in the mid small bowel.  - No other significant abnormalities.  Recommendations: CT enterography to evaluate submucosal mass. Continue following with hematology for IDA.    Mandy/Tammy: Please arrange CT enterography. Diagnosis: IDA, submucosal mass of the mid small bowel on givens capsule   Darl Pikes: Per patient's request, please send full capsule report to her oncologist.  This should be available under the notes tab.

## 2023-11-02 ENCOUNTER — Other Ambulatory Visit: Payer: Self-pay | Admitting: *Deleted

## 2023-11-02 ENCOUNTER — Encounter: Payer: Self-pay | Admitting: *Deleted

## 2023-11-02 DIAGNOSIS — K6389 Other specified diseases of intestine: Secondary | ICD-10-CM

## 2023-11-02 DIAGNOSIS — D509 Iron deficiency anemia, unspecified: Secondary | ICD-10-CM

## 2023-11-02 NOTE — Telephone Encounter (Signed)
Referral sent 

## 2023-11-02 NOTE — Telephone Encounter (Signed)
Pt has been scheduled for 12/19/22, arrive at 11:30 am, my chart message sent to pt. Given number to call to check on cancellations

## 2023-11-02 NOTE — Telephone Encounter (Signed)
[facility] for CT enterographyCPT Code 25956 Description: CT ABDOMEN & PELVIS W/ Case Number: [id number] Review Date: 11/02/2023 12:35:45 PM Expiration Date: N/A Status: This member's benefit plan did not require a prior authorization for this request.   Disregard referral being sent.

## 2023-11-02 NOTE — Telephone Encounter (Signed)
Pt read MyChart message.

## 2023-11-07 ENCOUNTER — Other Ambulatory Visit: Payer: Self-pay | Admitting: Gastroenterology

## 2023-11-07 DIAGNOSIS — K219 Gastro-esophageal reflux disease without esophagitis: Secondary | ICD-10-CM

## 2023-11-07 MED ORDER — PANTOPRAZOLE SODIUM 40 MG PO TBEC: 40.0000 mg | DELAYED_RELEASE_TABLET | Freq: Two times a day (BID) | ORAL | 5 refills | Status: DC

## 2023-11-25 DIAGNOSIS — R030 Elevated blood-pressure reading, without diagnosis of hypertension: Secondary | ICD-10-CM | POA: Diagnosis not present

## 2023-11-25 DIAGNOSIS — R1310 Dysphagia, unspecified: Secondary | ICD-10-CM | POA: Diagnosis not present

## 2023-11-25 DIAGNOSIS — M810 Age-related osteoporosis without current pathological fracture: Secondary | ICD-10-CM | POA: Diagnosis not present

## 2023-11-25 DIAGNOSIS — K760 Fatty (change of) liver, not elsewhere classified: Secondary | ICD-10-CM | POA: Diagnosis not present

## 2023-11-25 DIAGNOSIS — D649 Anemia, unspecified: Secondary | ICD-10-CM | POA: Diagnosis not present

## 2023-11-25 DIAGNOSIS — Z6820 Body mass index (BMI) 20.0-20.9, adult: Secondary | ICD-10-CM | POA: Diagnosis not present

## 2023-11-25 DIAGNOSIS — S32050A Wedge compression fracture of fifth lumbar vertebra, initial encounter for closed fracture: Secondary | ICD-10-CM | POA: Diagnosis not present

## 2023-11-25 DIAGNOSIS — F17200 Nicotine dependence, unspecified, uncomplicated: Secondary | ICD-10-CM | POA: Diagnosis not present

## 2023-11-25 DIAGNOSIS — G8929 Other chronic pain: Secondary | ICD-10-CM | POA: Diagnosis not present

## 2023-12-12 DIAGNOSIS — D500 Iron deficiency anemia secondary to blood loss (chronic): Secondary | ICD-10-CM | POA: Diagnosis not present

## 2023-12-19 DIAGNOSIS — D500 Iron deficiency anemia secondary to blood loss (chronic): Secondary | ICD-10-CM | POA: Diagnosis not present

## 2023-12-20 ENCOUNTER — Encounter (HOSPITAL_COMMUNITY): Payer: Self-pay | Admitting: Radiology

## 2023-12-20 ENCOUNTER — Ambulatory Visit (HOSPITAL_COMMUNITY)
Admission: RE | Admit: 2023-12-20 | Discharge: 2023-12-20 | Disposition: A | Discharge status: Home / Self Care | Payer: Medicare Other | Source: Ambulatory Visit | Attending: Gastroenterology | Admitting: Gastroenterology

## 2023-12-20 DIAGNOSIS — D509 Iron deficiency anemia, unspecified: Secondary | ICD-10-CM | POA: Diagnosis not present

## 2023-12-20 DIAGNOSIS — R935 Abnormal findings on diagnostic imaging of other abdominal regions, including retroperitoneum: Secondary | ICD-10-CM | POA: Diagnosis not present

## 2023-12-20 DIAGNOSIS — K6389 Other specified diseases of intestine: Secondary | ICD-10-CM | POA: Diagnosis not present

## 2023-12-20 MED ORDER — IOHEXOL 300 MG/ML  SOLN
100.0000 mL | Freq: Once | INTRAMUSCULAR | Status: AC | PRN
  Administered 2023-12-20: 100 mL via INTRAVENOUS

## 2023-12-22 DIAGNOSIS — Z5181 Encounter for therapeutic drug level monitoring: Secondary | ICD-10-CM | POA: Diagnosis not present

## 2023-12-22 DIAGNOSIS — Z79891 Long term (current) use of opiate analgesic: Secondary | ICD-10-CM | POA: Diagnosis not present

## 2023-12-22 DIAGNOSIS — M47816 Spondylosis without myelopathy or radiculopathy, lumbar region: Secondary | ICD-10-CM | POA: Diagnosis not present

## 2023-12-22 DIAGNOSIS — G894 Chronic pain syndrome: Secondary | ICD-10-CM | POA: Diagnosis not present

## 2023-12-30 ENCOUNTER — Other Ambulatory Visit: Payer: Self-pay | Admitting: *Deleted

## 2023-12-30 DIAGNOSIS — R9389 Abnormal findings on diagnostic imaging of other specified body structures: Secondary | ICD-10-CM

## 2023-12-30 DIAGNOSIS — K6389 Other specified diseases of intestine: Secondary | ICD-10-CM

## 2024-01-06 ENCOUNTER — Encounter (HOSPITAL_COMMUNITY)
Admission: RE | Admit: 2024-01-06 | Discharge: 2024-01-06 | Disposition: A | Discharge status: Home / Self Care | Payer: Medicare Other | Source: Ambulatory Visit | Attending: Gastroenterology | Admitting: Gastroenterology

## 2024-01-06 DIAGNOSIS — R9389 Abnormal findings on diagnostic imaging of other specified body structures: Secondary | ICD-10-CM | POA: Insufficient documentation

## 2024-01-06 DIAGNOSIS — K6389 Other specified diseases of intestine: Secondary | ICD-10-CM | POA: Diagnosis not present

## 2024-01-06 DIAGNOSIS — R935 Abnormal findings on diagnostic imaging of other abdominal regions, including retroperitoneum: Secondary | ICD-10-CM | POA: Diagnosis not present

## 2024-01-06 MED ORDER — COPPER CU 64 DOTATATE 1 MCI/ML IV SOLN
4.0000 | Freq: Once | INTRAVENOUS | Status: AC
  Administered 2024-01-06: 4.06 via INTRAVENOUS

## 2024-01-09 ENCOUNTER — Encounter: Payer: Self-pay | Admitting: *Deleted

## 2024-01-10 NOTE — Telephone Encounter (Signed)
Spoke with patient. Answered questions to the best of my ability, but advised she will need to discuss details with oncology. She has an appointment with oncology tomorrow.

## 2024-01-11 DIAGNOSIS — R599 Enlarged lymph nodes, unspecified: Secondary | ICD-10-CM | POA: Diagnosis not present

## 2024-01-12 ENCOUNTER — Other Ambulatory Visit (HOSPITAL_COMMUNITY)
Admission: RE | Admit: 2024-01-12 | Discharge: 2024-01-12 | Disposition: A | Discharge status: Home / Self Care | Payer: Medicare Other | Source: Ambulatory Visit | Attending: "Endocrinology | Admitting: "Endocrinology

## 2024-01-12 DIAGNOSIS — E890 Postprocedural hypothyroidism: Secondary | ICD-10-CM | POA: Diagnosis not present

## 2024-01-12 LAB — T4, FREE: Free T4: 1.37 ng/dL — ABNORMAL HIGH (ref 0.61–1.12)

## 2024-01-12 LAB — LIPID PANEL
Cholesterol: 230 mg/dL — ABNORMAL HIGH (ref 0–200)
HDL: 112 mg/dL (ref >40)
LDL Cholesterol: 113 mg/dL — ABNORMAL HIGH (ref 0–99)
Total CHOL/HDL Ratio: 2.1 {ratio}
Triglycerides: 23 mg/dL (ref <150)
VLDL: 5 mg/dL (ref 0–40)

## 2024-01-12 LAB — TSH: TSH: 2.941 u[IU]/mL (ref 0.350–4.500)

## 2024-01-19 ENCOUNTER — Encounter: Payer: Self-pay | Admitting: "Endocrinology

## 2024-01-19 ENCOUNTER — Ambulatory Visit: Payer: Medicare Other | Admitting: "Endocrinology

## 2024-01-19 VITALS — BP 118/76 | HR 72 | Ht 64.0 in | Wt 123.6 lb

## 2024-01-19 DIAGNOSIS — E890 Postprocedural hypothyroidism: Secondary | ICD-10-CM | POA: Diagnosis not present

## 2024-01-19 DIAGNOSIS — R7303 Prediabetes: Secondary | ICD-10-CM

## 2024-01-19 NOTE — Progress Notes (Signed)
01/19/2024, 5:33 PM                        Endocrinology follow-up note  Subjective:    Patient ID: Nicole Ewing, female    DOB: Apr 18, 1961, PCP Donetta Potts, MD   Past Medical History:  Diagnosis Date   Acid reflux    Anemia    Anxiety    Arthritis    Cervical cancer (HCC)    COPD (chronic obstructive pulmonary disease) (HCC)    Depression    H/O: hysterectomy    Cervical dysplasia   High cholesterol    Hyperthyroidism    Menopause ovarian failure    Migraines    Osteoporosis    Past Surgical History:  Procedure Laterality Date   ABDOMINAL HYSTERECTOMY     BIOPSY  03/19/2021   Procedure: BIOPSY;  Surgeon: Corbin Ade, MD;  [facility];  Service: Endoscopy;;   BIOPSY  10/11/2022   Procedure: BIOPSY;  Surgeon: Corbin Ade, MD;  [facility];  Service: Endoscopy;;   BREAST SURGERY Right    breast buiopsy   COLONOSCOPY  04/06/2007   [facility]:  Normal rectum, no inflammation, no colonic or rectal polyps, tortuous and redundant colon. Recommendations to repeat TCS in 5 years (2013).    COLONOSCOPY WITH PROPOFOL N/A 02/07/2020   Procedure: COLONOSCOPY WITH PROPOFOL;  Surgeon: Corbin Ade, MD;  1 hyperplastic polyp. Otherwise normal exam. Recommended repeat in 5 years.    COLONOSCOPY WITH PROPOFOL N/A 10/11/2022   Surgeon: Corbin Ade, MD;   Hemorrhoids on perianal exam, nonbleeding external and internal hemorrhoids, otherwise normal exam.  Repeat in 10 years.   ESOPHAGOGASTRODUODENOSCOPY (EGD) WITH PROPOFOL N/A 03/19/2021   Surgeon: Corbin Ade, MD;  Normal esophagus s/p dilation and biopsy, patchy erythema of gastric mucosa particularly the body with a polypoid appearance s/p biopsy, normal examined duodenum.  Pathology with chronic mild gastritis, negative for H. pylori.  Esophageal biopsy benign.   ESOPHAGOGASTRODUODENOSCOPY (EGD) WITH PROPOFOL N/A 10/11/2022    Surgeon: Corbin Ade, MD; Normal esophagus, gastric ulcer with surrounding erosions biopsied, duodenal erosions biopsied. Gastric biopsy was negative for H. pylori, duodenal biopsy benign.   ESOPHAGOGASTRODUODENOSCOPY (EGD) WITH PROPOFOL N/A 04/25/2023   Procedure: ESOPHAGOGASTRODUODENOSCOPY (EGD) WITH PROPOFOL;  Surgeon: Corbin Ade, MD;  [facility];  Service: Endoscopy;  Laterality: N/A;  2:00 pm, ASA 2   GIVENS CAPSULE STUDY N/A 10/17/2023   Procedure: GIVENS CAPSULE STUDY;  Surgeon: Corbin Ade, MD;  [facility];  Service: Endoscopy;  Laterality: N/A;  730am   MALONEY DILATION N/A 03/19/2021   Procedure: Elease Hashimoto DILATION;  Surgeon: Corbin Ade, MD;  [facility];  Service: Endoscopy;  Laterality: N/A;   POLYPECTOMY  02/07/2020   Procedure: POLYPECTOMY;  Surgeon: Corbin Ade, MD;  [facility];  Service: Endoscopy;;   TONSILLECTOMY     TOTAL HIP ARTHROPLASTY Left 11/16/2018   TOTAL HIP ARTHROPLASTY Right 07/02/2019   Social History   Socioeconomic History   Marital status: Married    Spouse name: Not on file   Number of children: Not on  file   Years of education: Not on file   Highest education level: Not on file  Occupational History   Not on file  Tobacco Use   Smoking status: Former    Current packs/day: 0.00    Average packs/day: 0.3 packs/day for 25.0 years (6.3 ttl pk-yrs)    Types: Cigars, Cigarettes    Start date: 05/1997    Quit date: 05/2022    Years since quitting: 1.7   Smokeless tobacco: Former   Tobacco comments:    1-2 cigars daily  Vaping Use   Vaping status: Some Days  Substance and Sexual Activity   Alcohol use: Not Currently    Comment: 4-6 beers daily (decresed to 2 every other day -08/15/2020); quit January 2022   Drug use: Not Currently   Sexual activity: Yes    Birth control/protection: Surgical  Other Topics Concern   Not on file  Social History Narrative   Not on file   Social  Drivers of Health   Financial Resource Strain: Low Risk  (09/15/2022)   Received from [facility], [facility]   Overall Financial Resource Strain (CARDIA)    Difficulty of Paying Living Expenses: Not hard at all  Food Insecurity: No Food Insecurity (09/15/2022)   Received from [facility], [facility]   Hunger Vital Sign    Worried About Running Out of Food in the Last Year: Never true    Ran Out of Food in the Last Year: Never true  Transportation Needs: No Transportation Needs (09/15/2022)   [facility], [facility]   [facility] - Transportation    Lack of Transportation (Medical): No    Lack of Transportation (Non-Medical): No  Physical Activity: Insufficiently Active (09/15/2022)   Received from [facility], [facility]   Exercise Vital Sign    Days of Exercise per Week: 3 days    Minutes of Exercise per Session: 40 min  Stress: No Stress Concern Present (09/15/2022)   Received from [facility], [facility] - Occupational Stress Questionnaire    Feeling of Stress : Only a little  Social Connections: Socially Integrated (09/15/2022)   Received from [facility], [facility]   Social Connection and Isolation Panel [NHANES]    Frequency of Communication with Friends and Family: More than three times a week    Frequency of Social Gatherings with Friends and Family: Twice a week    Attends Religious Services: More than 4 times per year    Active Member of [employer] or Organizations: Yes    Attends Banker Meetings: 1 to 4 times per year    Marital Status: Married   Family History  Problem Relation Age of Onset   Melanoma Father    Luiz Blare' disease Child    Colon polyps Brother    Colon cancer Neg Hx    Outpatient Encounter Medications as of 01/19/2024  Medication Sig   oxyCODONE (OXY IR/ROXICODONE) 5 MG immediate release tablet Take 5 mg by mouth daily.   Blood  Glucose Monitoring Suppl (ACCU-CHEK GUIDE ME) w/Device KIT 1 Piece by Does not apply route as directed.   Calcium Carbonate-Vitamin D (CALCIUM 600+D PO) Take 2 tablets by mouth daily. 1200mg  calcium, 1000mg  vit D   diazepam (VALIUM) 5 MG tablet Take 5 mg by mouth See admin instructions. Take 5 mg at bedtime, may take a 5 mg dose during  the day as needed for anxiety   glucose blood (ACCU-CHEK GUIDE) test strip USE ONLY WHEN YOU GET SYMPTOMS OF LOW GLUCOSE   Lancets MISC 1 each by Does not apply route as directed.   levothyroxine (SYNTHROID) 75 MCG tablet TAKE ONE TABLET BY MOUTH EVERY DAY BEFORE BREAKFAST   methocarbamol (ROBAXIN) 750 MG tablet Take 750 mg by mouth 2 (two) times daily as needed for muscle spasms. (Patient not taking: Reported on 09/15/2023)   Multiple Vitamin (MULTIVITAMIN WITH MINERALS) TABS tablet Take 1 tablet by mouth daily.   pantoprazole (PROTONIX) 40 MG tablet Take 1 tablet (40 mg total) by mouth 2 (two) times daily.   Polyethyl Glycol-Propyl Glycol (SYSTANE ULTRA OP) Place 1 drop into both eyes daily as needed (dry eyes).   senna (SENOKOT) 8.6 MG tablet Take 1 tablet by mouth daily.   vitamin B-12 (CYANOCOBALAMIN) 250 MCG tablet Take 250 mcg by mouth daily.   Zoledronic Acid (RECLAST IV) Inject into the vein. Once a year. Last had 10/07/21   [DISCONTINUED] cetirizine (ZYRTEC) 10 MG tablet Take 10 mg by mouth daily.   [DISCONTINUED] HYDROcodone-acetaminophen (NORCO/VICODIN) 5-325 MG tablet Take 1 tablet by mouth 2 (two) times daily as needed for moderate pain. (Patient not taking: Reported on 09/15/2023)   [DISCONTINUED] Probiotic Product (PROBIOTIC DAILY PO) Take 1 capsule by mouth daily.   No facility-administered encounter medications on file as of 01/19/2024.   ALLERGIES: Allergies  Allergen Reactions   Codeine Rash, Hives and Nausea And Vomiting   Orphenadrine Nausea And Vomiting   Keflex [Cephalexin] Rash   Tramadol Nausea And Vomiting    Sick on stomach      VACCINATION STATUS: Immunization History  Administered Date(s) Administered   Hepatitis B, ADULT 09/06/2019, 10/11/2019, 04/03/2020   Moderna Covid-19 Fall Seasonal Vaccine 34yrs & older 09/11/2022   PNEUMOCOCCAL CONJUGATE-20 05/25/2022   Td (Adult), 2 Lf Tetanus Toxid, Preservative Free 09/27/1995   Tdap 03/19/2015   Zoster Recombinant(Shingrix) 11/16/2021, 01/19/2022    HPI TIAIRA ARAMBULA is 63 y.o. female who is returning to follow-up for her RAI induced hypothyroidism.   -After workup for hyperthyroidism showed a hyperfunctioning adenoma on the left lobe of her thyroid, she was given radioactive iodine thyroid ablation which she received on December 14, 2022.   She is currently on levothyroxine 75 mcg p.o. daily before breakfast.  Her previsit labs are consistent with slight over replacement.     She is currently being worked up for intra-abdominal lymphadenopathy which will need CT-guided biopsy. She previously underwent went fine-needle aspiration of a cold nodule in the thyroid with benign outcomes. Her previous symptoms of palpitations, tremors, heat intolerance have largely resolved.     She does have family history of thyroid dysfunction in her daughter.   She denies dysphagia, shortness of breath, nor voice change. She admits to upto 6 beers daily.  Review of Systems Limited as above.  Objective:    BP 118/76   Pulse 72   Ht 5\' 4"  (1.626 m)   Wt 123 lb 9.6 oz (56.1 kg)   BMI 21.22 kg/m   Wt Readings from Last 3 Encounters:  01/19/24 123 lb 9.6 oz (56.1 kg)  10/17/23 122 lb 6.4 oz (55.5 kg)  09/15/23 128 lb (58.1 kg)    Physical Exam  CMP ( most recent) CMP     Component Value Date/Time   NA 129 (L) 04/05/2023 1436   NA 139 08/18/2022 0000   K 4.0 04/05/2023 1436  CL 96 (L) 04/05/2023 1436   CO2 23 04/05/2023 1436   GLUCOSE 110 (H) 04/05/2023 1436   BUN 10 04/05/2023 1436   BUN 8 08/18/2022 0000   CREATININE 0.71 04/05/2023 1436   CREATININE  0.43 (L) 06/05/2019 1446   CALCIUM 8.8 (L) 04/05/2023 1436   PROT 7.3 09/15/2023 1601   ALBUMIN 4.1 09/15/2023 1601   AST 21 09/15/2023 1601   ALT 12 09/15/2023 1601   ALKPHOS 40 09/15/2023 1601   BILITOT 0.4 09/15/2023 1601   GFRNONAA >60 04/05/2023 1436   GFRNONAA 112 06/05/2019 1446   GFRAA >60 12/04/2019 1353   GFRAA 130 06/05/2019 1446    Lipid Panel     Component Value Date/Time   CHOL 230 (H) 01/12/2024 0952   TRIG 23 01/12/2024 0952   HDL 112 01/12/2024 0952   CHOLHDL 2.1 01/12/2024 0952   VLDL 5 01/12/2024 0952   LDLCALC 113 (H) 01/12/2024 0952      Lab Results  Component Value Date   TSH 2.941 01/12/2024   TSH 34.602 (H) 07/13/2023   TSH 31.308 (H) 04/05/2023   TSH 0.594 02/24/2023   TSH <0.010 (L) 01/18/2023   TSH 0.21 (A) 08/18/2022   TSH 0.597 02/24/2022   TSH 0.699 08/26/2021   TSH 0.593 02/23/2021   TSH 1.110 08/28/2020   FREET4 1.37 (H) 01/12/2024   FREET4 0.83 07/13/2023   FREET4 0.52 (L) 04/05/2023   FREET4 0.87 02/24/2023   FREET4 1.84 (H) 01/18/2023   FREET4 0.88 02/24/2022   FREET4 0.78 08/26/2021   FREET4 0.86 02/23/2021   FREET4 0.75 08/28/2020   FREET4 0.69 02/25/2020      Assessment & Plan:   1.  RAI induced hypothyroidism 2.toxic adenoma-status post RAI-resolved.    I discussed her previsit labs with her.  Her labs are consistent with slight over replacement.  She does have a large supply of levothyroxine 75 mcg.  She will be allowed to utilize her available medication by skipping 1 day a week, continue 75 mcg for 6 out of 7 days a week until next measurement.   If he runs out before next visit, she will be considered for 62.5 mcg (half of 125 mcg of levothyroxine) p.o. daily before breakfast.  - We discussed about the correct intake of her thyroid hormone, on empty stomach at fasting, with water, separated by at least 30 minutes from breakfast and other medications,  and separated by more than 4 hours from calcium, iron,  multivitamins, acid reflux medications (PPIs). -Patient is made aware of the fact that thyroid hormone replacement is needed for life, dose to be adjusted by periodic monitoring of thyroid function tests.  -She has a negative fine-needle aspiration of a thyroid nodule.   She was counseled on smoking cessation previously.  Remains successfully quit since June 2023.   She is advised to maintain close follow-up with her oncology providers for the planned workup for intra-abdominal lymphadenopathy.   - I advised her  to maintain close follow up with Donetta Potts, MD for primary care needs.  I spent  21  minutes in the care of the patient today including review of labs from Thyroid Function, CMP, and other relevant labs ; imaging/biopsy records (current and previous including abstractions from other facilities); face-to-face time discussing  her lab results and symptoms, medications doses, her options of short and long term treatment based on the latest standards of care / guidelines;   and documenting the encounter.  Nicole Ewing  participated in the discussions, expressed understanding, and voiced agreement with the above plans.  All questions were answered to her satisfaction. she is encouraged to contact clinic should she have any questions or concerns prior to her return visit.    Follow up plan: Return in about 6 months (around 07/18/2024), or Skip 1 day a week for your levothyroxine, take it 6 days out of 7 ., for F/U with Pre-visit Labs.   Marquis Lunch, MD [facility] [address] [place], [place] [address] Phone: [phone]  Fax: [phone]     01/19/2024, 5:33 PM  This note was partially dictated with voice recognition software. Similar sounding words can be transcribed inadequately or may not  be corrected upon review.

## 2024-02-02 DIAGNOSIS — E119 Type 2 diabetes mellitus without complications: Secondary | ICD-10-CM | POA: Diagnosis not present

## 2024-02-02 DIAGNOSIS — Z885 Allergy status to narcotic agent status: Secondary | ICD-10-CM | POA: Diagnosis not present

## 2024-02-02 DIAGNOSIS — E782 Mixed hyperlipidemia: Secondary | ICD-10-CM | POA: Diagnosis not present

## 2024-02-02 DIAGNOSIS — R590 Localized enlarged lymph nodes: Secondary | ICD-10-CM | POA: Diagnosis not present

## 2024-02-02 DIAGNOSIS — J449 Chronic obstructive pulmonary disease, unspecified: Secondary | ICD-10-CM | POA: Diagnosis not present

## 2024-02-02 DIAGNOSIS — I700 Atherosclerosis of aorta: Secondary | ICD-10-CM | POA: Diagnosis not present

## 2024-02-02 DIAGNOSIS — Z881 Allergy status to other antibiotic agents status: Secondary | ICD-10-CM | POA: Diagnosis not present

## 2024-02-02 DIAGNOSIS — Z87891 Personal history of nicotine dependence: Secondary | ICD-10-CM | POA: Diagnosis not present

## 2024-02-02 DIAGNOSIS — R599 Enlarged lymph nodes, unspecified: Secondary | ICD-10-CM | POA: Diagnosis not present

## 2024-02-02 DIAGNOSIS — K219 Gastro-esophageal reflux disease without esophagitis: Secondary | ICD-10-CM | POA: Diagnosis not present

## 2024-02-09 DIAGNOSIS — R599 Enlarged lymph nodes, unspecified: Secondary | ICD-10-CM | POA: Diagnosis not present

## 2024-02-22 ENCOUNTER — Encounter: Payer: Self-pay | Admitting: Internal Medicine

## 2024-02-27 DIAGNOSIS — R599 Enlarged lymph nodes, unspecified: Secondary | ICD-10-CM | POA: Diagnosis not present

## 2024-02-27 DIAGNOSIS — D099 Carcinoma in situ, unspecified: Secondary | ICD-10-CM | POA: Diagnosis not present

## 2024-03-05 DIAGNOSIS — I10 Essential (primary) hypertension: Secondary | ICD-10-CM | POA: Diagnosis not present

## 2024-03-05 DIAGNOSIS — E0590 Thyrotoxicosis, unspecified without thyrotoxic crisis or storm: Secondary | ICD-10-CM | POA: Diagnosis not present

## 2024-03-05 DIAGNOSIS — K760 Fatty (change of) liver, not elsewhere classified: Secondary | ICD-10-CM | POA: Diagnosis not present

## 2024-03-12 DIAGNOSIS — I10 Essential (primary) hypertension: Secondary | ICD-10-CM | POA: Diagnosis not present

## 2024-03-12 DIAGNOSIS — E0590 Thyrotoxicosis, unspecified without thyrotoxic crisis or storm: Secondary | ICD-10-CM | POA: Diagnosis not present

## 2024-03-12 DIAGNOSIS — K760 Fatty (change of) liver, not elsewhere classified: Secondary | ICD-10-CM | POA: Diagnosis not present

## 2024-03-12 DIAGNOSIS — Z6820 Body mass index (BMI) 20.0-20.9, adult: Secondary | ICD-10-CM | POA: Diagnosis not present

## 2024-03-13 DIAGNOSIS — D638 Anemia in other chronic diseases classified elsewhere: Secondary | ICD-10-CM | POA: Diagnosis not present

## 2024-03-13 DIAGNOSIS — E039 Hypothyroidism, unspecified: Secondary | ICD-10-CM | POA: Diagnosis not present

## 2024-03-13 DIAGNOSIS — E119 Type 2 diabetes mellitus without complications: Secondary | ICD-10-CM | POA: Diagnosis not present

## 2024-03-13 DIAGNOSIS — R591 Generalized enlarged lymph nodes: Secondary | ICD-10-CM | POA: Diagnosis not present

## 2024-03-13 DIAGNOSIS — C7A012 Malignant carcinoid tumor of the ileum: Secondary | ICD-10-CM | POA: Diagnosis not present

## 2024-03-13 DIAGNOSIS — D3A8 Other benign neuroendocrine tumors: Secondary | ICD-10-CM | POA: Diagnosis not present

## 2024-03-13 DIAGNOSIS — J449 Chronic obstructive pulmonary disease, unspecified: Secondary | ICD-10-CM | POA: Diagnosis not present

## 2024-03-13 DIAGNOSIS — M1990 Unspecified osteoarthritis, unspecified site: Secondary | ICD-10-CM | POA: Diagnosis not present

## 2024-03-13 DIAGNOSIS — E782 Mixed hyperlipidemia: Secondary | ICD-10-CM | POA: Diagnosis not present

## 2024-03-13 DIAGNOSIS — Z87891 Personal history of nicotine dependence: Secondary | ICD-10-CM | POA: Diagnosis not present

## 2024-03-13 DIAGNOSIS — K219 Gastro-esophageal reflux disease without esophagitis: Secondary | ICD-10-CM | POA: Diagnosis not present

## 2024-03-13 DIAGNOSIS — L905 Scar conditions and fibrosis of skin: Secondary | ICD-10-CM | POA: Diagnosis not present

## 2024-03-13 DIAGNOSIS — R590 Localized enlarged lymph nodes: Secondary | ICD-10-CM | POA: Diagnosis not present

## 2024-03-13 DIAGNOSIS — D509 Iron deficiency anemia, unspecified: Secondary | ICD-10-CM | POA: Diagnosis not present

## 2024-03-22 DIAGNOSIS — Z0001 Encounter for general adult medical examination with abnormal findings: Secondary | ICD-10-CM | POA: Diagnosis not present

## 2024-03-22 DIAGNOSIS — Z6820 Body mass index (BMI) 20.0-20.9, adult: Secondary | ICD-10-CM | POA: Diagnosis not present

## 2024-03-22 DIAGNOSIS — Z1389 Encounter for screening for other disorder: Secondary | ICD-10-CM | POA: Diagnosis not present

## 2024-03-22 DIAGNOSIS — R110 Nausea: Secondary | ICD-10-CM | POA: Diagnosis not present

## 2024-03-22 DIAGNOSIS — R197 Diarrhea, unspecified: Secondary | ICD-10-CM | POA: Diagnosis not present

## 2024-03-23 DIAGNOSIS — R599 Enlarged lymph nodes, unspecified: Secondary | ICD-10-CM | POA: Diagnosis not present

## 2024-03-29 DIAGNOSIS — C7A012 Malignant carcinoid tumor of the ileum: Secondary | ICD-10-CM | POA: Diagnosis not present

## 2024-03-29 DIAGNOSIS — D3A8 Other benign neuroendocrine tumors: Secondary | ICD-10-CM | POA: Diagnosis not present

## 2024-03-29 DIAGNOSIS — R590 Localized enlarged lymph nodes: Secondary | ICD-10-CM | POA: Diagnosis not present

## 2024-03-29 DIAGNOSIS — E039 Hypothyroidism, unspecified: Secondary | ICD-10-CM | POA: Diagnosis not present

## 2024-03-29 DIAGNOSIS — R002 Palpitations: Secondary | ICD-10-CM | POA: Diagnosis not present

## 2024-03-29 DIAGNOSIS — R0682 Tachypnea, not elsewhere classified: Secondary | ICD-10-CM | POA: Diagnosis not present

## 2024-03-29 DIAGNOSIS — M810 Age-related osteoporosis without current pathological fracture: Secondary | ICD-10-CM | POA: Diagnosis not present

## 2024-03-29 DIAGNOSIS — G8929 Other chronic pain: Secondary | ICD-10-CM | POA: Diagnosis not present

## 2024-04-06 DIAGNOSIS — C7A012 Malignant carcinoid tumor of the ileum: Secondary | ICD-10-CM | POA: Diagnosis not present

## 2024-04-06 DIAGNOSIS — D3A8 Other benign neuroendocrine tumors: Secondary | ICD-10-CM | POA: Diagnosis not present

## 2024-04-09 DIAGNOSIS — D3A8 Other benign neuroendocrine tumors: Secondary | ICD-10-CM | POA: Diagnosis not present

## 2024-04-12 DIAGNOSIS — D500 Iron deficiency anemia secondary to blood loss (chronic): Secondary | ICD-10-CM | POA: Diagnosis not present

## 2024-04-19 DIAGNOSIS — C260 Malignant neoplasm of intestinal tract, part unspecified: Secondary | ICD-10-CM | POA: Diagnosis not present

## 2024-04-19 DIAGNOSIS — D3A8 Other benign neuroendocrine tumors: Secondary | ICD-10-CM | POA: Diagnosis not present

## 2024-04-19 DIAGNOSIS — D500 Iron deficiency anemia secondary to blood loss (chronic): Secondary | ICD-10-CM | POA: Diagnosis not present

## 2024-04-19 DIAGNOSIS — Z96642 Presence of left artificial hip joint: Secondary | ICD-10-CM | POA: Diagnosis not present

## 2024-04-24 DIAGNOSIS — R0682 Tachypnea, not elsewhere classified: Secondary | ICD-10-CM | POA: Diagnosis not present

## 2024-04-24 DIAGNOSIS — D3A8 Other benign neuroendocrine tumors: Secondary | ICD-10-CM | POA: Diagnosis not present

## 2024-04-24 DIAGNOSIS — C7A012 Malignant carcinoid tumor of the ileum: Secondary | ICD-10-CM | POA: Diagnosis not present

## 2024-04-24 DIAGNOSIS — R002 Palpitations: Secondary | ICD-10-CM | POA: Diagnosis not present

## 2024-04-26 DIAGNOSIS — M7918 Myalgia, other site: Secondary | ICD-10-CM | POA: Diagnosis not present

## 2024-04-26 DIAGNOSIS — D3A8 Other benign neuroendocrine tumors: Secondary | ICD-10-CM | POA: Diagnosis not present

## 2024-04-26 DIAGNOSIS — M47816 Spondylosis without myelopathy or radiculopathy, lumbar region: Secondary | ICD-10-CM | POA: Diagnosis not present

## 2024-04-26 DIAGNOSIS — G894 Chronic pain syndrome: Secondary | ICD-10-CM | POA: Diagnosis not present

## 2024-04-26 DIAGNOSIS — Z5181 Encounter for therapeutic drug level monitoring: Secondary | ICD-10-CM | POA: Diagnosis not present

## 2024-04-26 DIAGNOSIS — Z79891 Long term (current) use of opiate analgesic: Secondary | ICD-10-CM | POA: Diagnosis not present

## 2024-05-02 ENCOUNTER — Other Ambulatory Visit: Payer: Self-pay | Admitting: "Endocrinology

## 2024-05-02 ENCOUNTER — Encounter: Payer: Self-pay | Admitting: "Endocrinology

## 2024-05-02 MED ORDER — LEVOTHYROXINE SODIUM 125 MCG PO TABS: 62.5000 ug | ORAL_TABLET | Freq: Every day | ORAL | 0 refills | Status: DC

## 2024-05-07 DIAGNOSIS — I10 Essential (primary) hypertension: Secondary | ICD-10-CM | POA: Diagnosis not present

## 2024-05-07 DIAGNOSIS — Z0001 Encounter for general adult medical examination with abnormal findings: Secondary | ICD-10-CM | POA: Diagnosis not present

## 2024-05-07 DIAGNOSIS — E039 Hypothyroidism, unspecified: Secondary | ICD-10-CM | POA: Diagnosis not present

## 2024-05-07 DIAGNOSIS — R7989 Other specified abnormal findings of blood chemistry: Secondary | ICD-10-CM | POA: Diagnosis not present

## 2024-05-07 DIAGNOSIS — K760 Fatty (change of) liver, not elsewhere classified: Secondary | ICD-10-CM | POA: Diagnosis not present

## 2024-05-14 DIAGNOSIS — I10 Essential (primary) hypertension: Secondary | ICD-10-CM | POA: Diagnosis not present

## 2024-05-14 DIAGNOSIS — E0590 Thyrotoxicosis, unspecified without thyrotoxic crisis or storm: Secondary | ICD-10-CM | POA: Diagnosis not present

## 2024-05-14 DIAGNOSIS — Z681 Body mass index (BMI) 19 or less, adult: Secondary | ICD-10-CM | POA: Diagnosis not present

## 2024-05-14 DIAGNOSIS — K760 Fatty (change of) liver, not elsewhere classified: Secondary | ICD-10-CM | POA: Diagnosis not present

## 2024-05-15 DIAGNOSIS — Z96642 Presence of left artificial hip joint: Secondary | ICD-10-CM | POA: Diagnosis not present

## 2024-05-15 DIAGNOSIS — C260 Malignant neoplasm of intestinal tract, part unspecified: Secondary | ICD-10-CM | POA: Diagnosis not present

## 2024-05-15 DIAGNOSIS — D3A8 Other benign neuroendocrine tumors: Secondary | ICD-10-CM | POA: Diagnosis not present

## 2024-05-30 DIAGNOSIS — Z08 Encounter for follow-up examination after completed treatment for malignant neoplasm: Secondary | ICD-10-CM | POA: Diagnosis not present

## 2024-05-30 DIAGNOSIS — C260 Malignant neoplasm of intestinal tract, part unspecified: Secondary | ICD-10-CM | POA: Diagnosis not present

## 2024-05-30 DIAGNOSIS — C7A012 Malignant carcinoid tumor of the ileum: Secondary | ICD-10-CM | POA: Diagnosis not present

## 2024-05-30 DIAGNOSIS — D500 Iron deficiency anemia secondary to blood loss (chronic): Secondary | ICD-10-CM | POA: Diagnosis not present

## 2024-05-30 DIAGNOSIS — D3A8 Other benign neuroendocrine tumors: Secondary | ICD-10-CM | POA: Diagnosis not present

## 2024-06-11 DIAGNOSIS — E3400 Carcinoid syndrome, unspecified: Secondary | ICD-10-CM | POA: Diagnosis not present

## 2024-06-11 DIAGNOSIS — Z08 Encounter for follow-up examination after completed treatment for malignant neoplasm: Secondary | ICD-10-CM | POA: Diagnosis not present

## 2024-06-11 DIAGNOSIS — D500 Iron deficiency anemia secondary to blood loss (chronic): Secondary | ICD-10-CM | POA: Diagnosis not present

## 2024-06-11 DIAGNOSIS — C7A012 Malignant carcinoid tumor of the ileum: Secondary | ICD-10-CM | POA: Diagnosis not present

## 2024-06-11 DIAGNOSIS — D3A8 Other benign neuroendocrine tumors: Secondary | ICD-10-CM | POA: Diagnosis not present

## 2024-06-11 DIAGNOSIS — C260 Malignant neoplasm of intestinal tract, part unspecified: Secondary | ICD-10-CM | POA: Diagnosis not present

## 2024-06-12 DIAGNOSIS — Z96642 Presence of left artificial hip joint: Secondary | ICD-10-CM | POA: Diagnosis not present

## 2024-06-12 DIAGNOSIS — C260 Malignant neoplasm of intestinal tract, part unspecified: Secondary | ICD-10-CM | POA: Diagnosis not present

## 2024-07-09 ENCOUNTER — Other Ambulatory Visit (HOSPITAL_COMMUNITY)
Admission: RE | Admit: 2024-07-09 | Discharge: 2024-07-09 | Disposition: A | Discharge status: Home / Self Care | Source: Ambulatory Visit | Attending: "Endocrinology | Admitting: "Endocrinology

## 2024-07-09 DIAGNOSIS — E890 Postprocedural hypothyroidism: Secondary | ICD-10-CM | POA: Diagnosis not present

## 2024-07-09 DIAGNOSIS — D500 Iron deficiency anemia secondary to blood loss (chronic): Secondary | ICD-10-CM | POA: Diagnosis not present

## 2024-07-09 DIAGNOSIS — D3A8 Other benign neuroendocrine tumors: Secondary | ICD-10-CM | POA: Diagnosis not present

## 2024-07-09 DIAGNOSIS — E3400 Carcinoid syndrome, unspecified: Secondary | ICD-10-CM | POA: Diagnosis not present

## 2024-07-09 LAB — T4, FREE: Free T4: 0.93 ng/dL (ref 0.61–1.12)

## 2024-07-09 LAB — TSH: TSH: 4.099 u[IU]/mL (ref 0.350–4.500)

## 2024-07-10 DIAGNOSIS — C7A8 Other malignant neuroendocrine tumors: Secondary | ICD-10-CM | POA: Diagnosis not present

## 2024-07-10 DIAGNOSIS — Z96642 Presence of left artificial hip joint: Secondary | ICD-10-CM | POA: Diagnosis not present

## 2024-07-10 DIAGNOSIS — D509 Iron deficiency anemia, unspecified: Secondary | ICD-10-CM | POA: Diagnosis not present

## 2024-07-10 DIAGNOSIS — C260 Malignant neoplasm of intestinal tract, part unspecified: Secondary | ICD-10-CM | POA: Diagnosis not present

## 2024-07-10 DIAGNOSIS — R197 Diarrhea, unspecified: Secondary | ICD-10-CM | POA: Diagnosis not present

## 2024-07-10 DIAGNOSIS — D3A8 Other benign neuroendocrine tumors: Secondary | ICD-10-CM | POA: Diagnosis not present

## 2024-07-10 DIAGNOSIS — R110 Nausea: Secondary | ICD-10-CM | POA: Diagnosis not present

## 2024-07-10 NOTE — Progress Notes (Signed)
 [facility], [facility]  Hematology Oncology Consultation  Followup Visit Note  Patient Name: Nicole Ewing Patient Age: 63 y.o. Encounter Date: 07/10/2024  Referring Physician:  Letty Carder Bobette Ewing., MD [address] [place],  [place] [address]  Consulting Provider: Carder Bobette Nicole Ewing., MD  Hematology/Oncology  Reason for Visit: Follow-up metastatic neuroendocrine tumor.   Assessment/Plan: 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  Positive for metastasis in 1 out of 4 lymph nodes (pN1).  The diagnosis was obtained during diagnostic laparoscopy with small bowel resection on 03/13/2024 at [facility].  Dotatate PET scan on 01/06/2024 reported radiotracer avid central mesenteric lymph nodes, retroperitoneal lymph nodes extending from the celiac trunk to the iliac arteries, small bowel primary lesion identified, no liver metastasis and a single small lymph node in the mediastinum.  She seems to have Stage IV disease (cM1).  She also has a history of iron deficiency anemia due to GI blood loss with the need for IV iron.    Cancer Staging <redacted file path>  Neuroendocrine tumor of ileum (CMS-HCC) Staging form: Neuroendocrine Tumor - Jejunum/Ileum, AJCC V9 - Pathologic stage from 03/13/2024: Stage IV (pT4, pN0, cM1) - Signed by Jia, Jingquan, MD on 03/30/2024   I have reviewed the laboratory, pathology, and radiology reports in detail and discussed findings with the patient.  Interval History:   04/19/2024 - The patient was seen by Dr. Jia at [facility] on 03/29/2024.  She was having nausea, diarrhea and flushing symptoms.  She was recommended to start somatostatin analog therapy. The flushing symptoms persist but the nausea and diarrhea are gone. 24-hour urine was ordered for 5-HIAA and it was elevated at 14.3.  Chromogranin A was also elevated at 249.   I discussed with  her today about starting Octreotide LAR 30 mg IM every 4 weeks for tumor growth control. She understands the pros and cons of such therapy and is willing to proceed with it.  The present CBC does not show any anemia and the ferritin is normal at 43 ng/mL even though it has dropped from 209 ng/mL in January 2025.  She will remain under surveillance.    06/11/2024 - The patient is clinically stable on Octreotide LAR.   The iron levels are fine and the hemoglobin is normal.  She will remain on the present therapy and return for reevaluation in 2 months.    07/10/2024 - The patient is clinically stable. She is tolerating Octreotide LAR well so far. Chromogranin A from yesterday is pending. She will remain on the present therapy and return for reevaluation in 2 months.  Hemoglobin and iron levels are normal.   Review of Systems  Constitutional: Negative.   HENT:  Negative.    Eyes: Negative.   Respiratory: Negative.    Cardiovascular: Negative.   Gastrointestinal: Negative.   Endocrine: Negative.   Genitourinary: Negative.    Musculoskeletal: Negative.   Skin: Negative.   Neurological: Negative.   Hematological: Negative.     Vital signs for this encounter: BSA: 1.56 meters squared BP 129/86   Pulse 77   Temp 36.6 C (97.8 F) (Temporal)   Resp 16   Ht 164.5 cm (5' 4.76)   Wt 53.4 kg (117 lb 11.2 oz)   SpO2 100%   BMI 19.73 kg/m   Physical Exam Constitutional:      Appearance: Normal appearance.  HENT:  Head: Normocephalic.     Nose: Nose normal.     Mouth/Throat:     Mouth: Mucous membranes are moist.     Pharynx: Oropharynx is clear.  Eyes:     Conjunctiva/sclera: Conjunctivae normal.     Pupils: Pupils are equal, round, and reactive to light.  Pulmonary:     Breath sounds: Normal breath sounds.  Abdominal:     General: Abdomen is flat. Bowel sounds are normal.     Palpations: Abdomen is soft.  Musculoskeletal:        General: Normal range of motion.      Cervical back: Neck supple.  Skin:    General: Skin is warm.  Neurological:     General: No focal deficit present.     Mental Status: She is alert.     Karnofsky/Lansky Performance Status 90,  Able to carry on normal activity; minor signs or symptoms of disease (ECOG equivalent 0)   Results:  WBC  Date Value Ref Range Status  07/09/2024 5.9 4.0 - 10.5 10*9/L Final  02/21/2019 4.9 4.0 - 10.5 10^3/uL Final   HGB  Date Value Ref Range Status  07/09/2024 13.0 11.5 - 15.0 g/dL Final  96/81/7979 85.0 11.5 - 15.0 g/dL Final   HCT  Date Value Ref Range Status  07/09/2024 38.6 34.0 - 44.0 % Final  02/21/2019 45.3 (H) 34.0 - 44.0 % Final   Platelet  Date Value Ref Range Status  07/09/2024 215 140 - 415 10*9/L Final  02/21/2019 201 140 - 415 10^3/uL Final   Creatinine  Date Value Ref Range Status  06/11/2024 0.44 (L) 0.60 - 1.10 mg/dL Final  96/81/7979 9.49 (L) 0.6 - 1.3 mg/dL Final   AST  Date Value Ref Range Status  06/11/2024 15 15 - 40 U/L Final  02/21/2019 35.2 10 - 42 IU/L Final   I spent 40 minutes reviewing the records and face-to-face with the patient today.  Mertie Azure, MD

## 2024-07-18 ENCOUNTER — Encounter: Payer: Self-pay | Admitting: "Endocrinology

## 2024-07-18 ENCOUNTER — Ambulatory Visit: Payer: Medicare Other | Admitting: "Endocrinology

## 2024-07-18 VITALS — BP 122/74 | HR 76 | Ht 64.0 in | Wt 119.2 lb

## 2024-07-18 DIAGNOSIS — R7303 Prediabetes: Secondary | ICD-10-CM

## 2024-07-18 DIAGNOSIS — C7A8 Other malignant neuroendocrine tumors: Secondary | ICD-10-CM | POA: Diagnosis not present

## 2024-07-18 DIAGNOSIS — E890 Postprocedural hypothyroidism: Secondary | ICD-10-CM | POA: Insufficient documentation

## 2024-07-18 LAB — POCT GLYCOSYLATED HEMOGLOBIN (HGB A1C): HbA1c, POC (controlled diabetic range): 6 % (ref 0.0–7.0)

## 2024-07-18 MED ORDER — ACCU-CHEK GUIDE TEST VI STRP: ORAL_STRIP | 12 refills | Status: AC

## 2024-07-18 MED ORDER — LEVOTHYROXINE SODIUM 125 MCG PO TABS: 62.5000 ug | ORAL_TABLET | Freq: Every day | ORAL | 1 refills | Status: DC

## 2024-07-18 MED ORDER — ACCU-CHEK GUIDE ME W/DEVICE KIT: 1.0000 | PACK | 0 refills | Status: AC

## 2024-07-18 NOTE — Progress Notes (Signed)
 07/18/2024, 4:54 PM                        Endocrinology follow-up note  Subjective:    Patient ID: Nicole Ewing, female    DOB: 04/15/1961, PCP Trudy Vaughn FALCON, MD   Past Medical History:  Diagnosis Date   Acid reflux    Anemia    Anxiety    Arthritis    Cervical cancer (HCC)    COPD (chronic obstructive pulmonary disease) (HCC)    Depression    H/O: hysterectomy    Cervical dysplasia   High cholesterol    Hyperthyroidism    Menopause ovarian failure    Migraines    Osteoporosis    Past Surgical History:  Procedure Laterality Date   ABDOMINAL HYSTERECTOMY     BIOPSY  03/19/2021   Procedure: BIOPSY;  Surgeon: Shaaron Lamar CHRISTELLA, MD;  [facility];  Service: Endoscopy;;   BIOPSY  10/11/2022   Procedure: BIOPSY;  Surgeon: Shaaron Lamar CHRISTELLA, MD;  [facility];  Service: Endoscopy;;   BREAST SURGERY Right    breast buiopsy   COLONOSCOPY  04/06/2007   [facility]:  Normal rectum, no inflammation, no colonic or rectal polyps, tortuous and redundant colon. Recommendations to repeat TCS in 5 years (2013).    COLONOSCOPY WITH PROPOFOL  N/A 02/07/2020   Procedure: COLONOSCOPY WITH PROPOFOL ;  Surgeon: Shaaron Lamar CHRISTELLA, MD;  1 hyperplastic polyp. Otherwise normal exam. Recommended repeat in 5 years.    COLONOSCOPY WITH PROPOFOL  N/A 10/11/2022   Surgeon: Shaaron Lamar CHRISTELLA, MD;   Hemorrhoids on perianal exam, nonbleeding external and internal hemorrhoids, otherwise normal exam.  Repeat in 10 years.   ESOPHAGOGASTRODUODENOSCOPY (EGD) WITH PROPOFOL  N/A 03/19/2021   Surgeon: Shaaron Lamar CHRISTELLA, MD;  Normal esophagus s/p dilation and biopsy, patchy erythema of gastric mucosa particularly the body with a polypoid appearance s/p biopsy, normal examined duodenum.  Pathology with chronic mild gastritis, negative for H. pylori.  Esophageal biopsy benign.   ESOPHAGOGASTRODUODENOSCOPY (EGD) WITH PROPOFOL  N/A 10/11/2022    Surgeon: Shaaron Lamar CHRISTELLA, MD; Normal esophagus, gastric ulcer with surrounding erosions biopsied, duodenal erosions biopsied. Gastric biopsy was negative for H. pylori, duodenal biopsy benign.   ESOPHAGOGASTRODUODENOSCOPY (EGD) WITH PROPOFOL  N/A 04/25/2023   Procedure: ESOPHAGOGASTRODUODENOSCOPY (EGD) WITH PROPOFOL ;  Surgeon: Shaaron Lamar CHRISTELLA, MD;  [facility];  Service: Endoscopy;  Laterality: N/A;  2:00 pm, ASA 2   GIVENS CAPSULE STUDY N/A 10/17/2023   Procedure: GIVENS CAPSULE STUDY;  Surgeon: Shaaron Lamar CHRISTELLA, MD;  [facility];  Service: Endoscopy;  Laterality: N/A;  730am   MALONEY DILATION N/A 03/19/2021   Procedure: AGAPITO DILATION;  Surgeon: Shaaron Lamar CHRISTELLA, MD;  [facility];  Service: Endoscopy;  Laterality: N/A;   POLYPECTOMY  02/07/2020   Procedure: POLYPECTOMY;  Surgeon: Shaaron Lamar CHRISTELLA, MD;  [facility];  Service: Endoscopy;;   TONSILLECTOMY     TOTAL HIP ARTHROPLASTY Left 11/16/2018   TOTAL HIP ARTHROPLASTY Right 07/02/2019   Social History   Socioeconomic History   Marital status: Married    Spouse name: Not on file   Number of children: Not on  file   Years of education: Not on file   Highest education level: Not on file  Occupational History   Not on file  Tobacco Use   Smoking status: Former    Current packs/day: 0.00    Average packs/day: 0.3 packs/day for 25.0 years (6.3 ttl pk-yrs)    Types: Cigars, Cigarettes    Start date: 05/1997    Quit date: 05/2022    Years since quitting: 2.2   Smokeless tobacco: Former   Tobacco comments:    1-2 cigars daily  Vaping Use   Vaping status: Some Days  Substance and Sexual Activity   Alcohol use: Not Currently    Comment: 4-6 beers daily (decresed to 2 every other day -08/15/2020); quit January 2022   Drug use: Not Currently   Sexual activity: Yes    Birth control/protection: Surgical  Other Topics Concern   Not on file  Social History Narrative   Not on file   Social  Drivers of Health   Financial Resource Strain: Low Risk  (03/19/2024)   Received from [facility]   Overall Financial Resource Strain (CARDIA)    Difficulty of Paying Living Expenses: Not very hard  Food Insecurity: No Food Insecurity (03/19/2024)   Received from [facility]   Hunger Vital Sign    Within the past 12 months, you worried that your food would run out before you got the money to buy more.: Never true    Within the past 12 months, the food you bought just didn't last and you didn't have money to get more.: Never true  Transportation Needs: No Transportation Needs (03/19/2024)   Received from [facility]   PRAPARE - Transportation    Lack of Transportation (Medical): No    Lack of Transportation (Non-Medical): No  Physical Activity: Insufficiently Active (09/15/2022)   Received from [facility]   Exercise Vital Sign    On average, how many days per week do you engage in moderate to strenuous exercise (like a brisk walk)?: 3 days    On average, how many minutes do you engage in exercise at this level?: 40 min  Stress: No Stress Concern Present (09/15/2022)   Received from [facility] - Occupational Stress Questionnaire    Feeling of Stress : Only a little  Social Connections: Socially Integrated (09/15/2022)   Received from [facility]   Social Connection and Isolation Panel    In a typical week, how many times do you talk on the phone with family, friends, or neighbors?: More than three times a week    How often do you get together with friends or relatives?: Twice a week    How often do you attend church or religious services?: More than 4 times per year    Do you belong to any clubs or organizations such as church groups, unions, fraternal or athletic groups, or school groups?: Yes    How often do you attend meetings of the clubs or organizations you belong to?: 1 to 4 times per year    Are you married,  widowed, divorced, separated, never married, or living with a partner?: Married   Family History  Problem Relation Age of Onset   Melanoma Father    Yvone' disease Child    Colon polyps Brother    Colon cancer Neg Hx    Outpatient Encounter Medications as of 07/18/2024  Medication Sig   Blood Glucose  Monitoring Suppl (ACCU-CHEK GUIDE ME) w/Device KIT 1 Piece by Does not apply route as directed.   glucose blood (ACCU-CHEK GUIDE TEST) test strip Use to check glucose 2 times daily as instructed   octreotide (SANDOSTATIN LAR) 30 MG injection Inject 30 mg into the muscle every 28 (twenty-eight) days.   oxyCODONE (OXY IR/ROXICODONE) 5 MG immediate release tablet Take 5 mg by mouth.   Blood Glucose Monitoring Suppl (ACCU-CHEK GUIDE ME) w/Device KIT 1 Piece by Does not apply route as directed.   Calcium Carbonate-Vitamin D  (CALCIUM 600+D PO) Take 2 tablets by mouth daily. 1200mg  calcium, 1000mg  vit D   diazepam (VALIUM) 5 MG tablet Take 5 mg by mouth See admin instructions. Take 5 mg at bedtime, may take a 5 mg dose during the day as needed for anxiety   Lancets MISC 1 each by Does not apply route as directed.   levothyroxine  (SYNTHROID ) 125 MCG tablet Take 0.5 tablets (62.5 mcg total) by mouth daily before breakfast.   methocarbamol (ROBAXIN) 750 MG tablet Take 750 mg by mouth 2 (two) times daily as needed for muscle spasms. (Patient not taking: Reported on 09/15/2023)   Multiple Vitamin (MULTIVITAMIN WITH MINERALS) TABS tablet Take 1 tablet by mouth daily.   pantoprazole  (PROTONIX ) 40 MG tablet Take 1 tablet (40 mg total) by mouth 2 (two) times daily.   Polyethyl Glycol-Propyl Glycol (SYSTANE ULTRA OP) Place 1 drop into both eyes daily as needed (dry eyes).   senna (SENOKOT) 8.6 MG tablet Take 1 tablet by mouth daily.   vitamin B-12 (CYANOCOBALAMIN) 250 MCG tablet Take 250 mcg by mouth daily.   Zoledronic  Acid (RECLAST  IV) Inject into the vein. Once a year. Last had 10/07/21   [DISCONTINUED]  glucose blood (ACCU-CHEK GUIDE) test strip USE ONLY WHEN YOU GET SYMPTOMS OF LOW GLUCOSE   [DISCONTINUED] levothyroxine  (SYNTHROID ) 125 MCG tablet Take 0.5 tablets (62.5 mcg total) by mouth daily before breakfast.   No facility-administered encounter medications on file as of 07/18/2024.   ALLERGIES: Allergies  Allergen Reactions   Codeine Rash, Hives and Nausea And Vomiting   Orphenadrine Nausea And Vomiting   Keflex [Cephalexin] Rash   Tramadol Nausea And Vomiting    Sick on stomach     VACCINATION STATUS: Immunization History  Administered Date(s) Administered   Hepatitis B, ADULT 09/06/2019, 10/11/2019, 04/03/2020   Moderna Covid-19 Fall Seasonal Vaccine 73yrs & older 09/11/2022   PNEUMOCOCCAL CONJUGATE-20 05/25/2022   Td (Adult), 2 Lf Tetanus Toxid, Preservative Free 09/27/1995   Tdap 03/19/2015   Zoster Recombinant(Shingrix) 11/16/2021, 01/19/2022    HPI Nicole Ewing is 63 y.o. female who is returning to follow-up for her RAI induced hypothyroidism.   -After workup for hyperthyroidism showed a hyperfunctioning adenoma on the left lobe of her thyroid , she was given radioactive iodine thyroid  ablation which she received on December 14, 2022.   She is currently on levothyroxine  62.5 mcg p.o. daily before breakfast.  Her previsit labs are consistent with appropriate replacement.   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   She previously underwent went fine-needle aspiration of a cold nodule in the thyroid  with benign outcomes.  Her point-of-care A1c today is 6%, increasing from 5.1% during her previous measurement still consistent with prediabetes.   She does have family history of thyroid  dysfunction in her daughter.  She denies dysphagia, shortness of breath, nor  voice change. She admits to upto 6 beers daily.  Review of Systems Limited as above.  Objective:    BP 122/74   Pulse 76   Ht 5' 4 (1.626 m)   Wt 119 lb 3.2 oz (54.1 kg)   BMI 20.46 kg/m   Wt Readings from Last 3 Encounters:  07/18/24 119 lb 3.2 oz (54.1 kg)  01/19/24 123 lb 9.6 oz (56.1 kg)  10/17/23 122 lb 6.4 oz (55.5 kg)    Physical Exam  CMP ( most recent) CMP     Component Value Date/Time   NA 129 (L) 04/05/2023 1436   NA 139 08/18/2022 0000   K 4.0 04/05/2023 1436   CL 96 (L) 04/05/2023 1436   CO2 23 04/05/2023 1436   GLUCOSE 110 (H) 04/05/2023 1436   BUN 10 04/05/2023 1436   BUN 8 08/18/2022 0000   CREATININE 0.71 04/05/2023 1436   CREATININE 0.43 (L) 06/05/2019 1446   CALCIUM 8.8 (L) 04/05/2023 1436   PROT 7.3 09/15/2023 1601   ALBUMIN 4.1 09/15/2023 1601   AST 21 09/15/2023 1601   ALT 12 09/15/2023 1601   ALKPHOS 40 09/15/2023 1601   BILITOT 0.4 09/15/2023 1601   GFRNONAA >60 04/05/2023 1436   GFRNONAA 112 06/05/2019 1446   GFRAA >60 12/04/2019 1353   GFRAA 130 06/05/2019 1446    Lipid Panel     Component Value Date/Time   CHOL 230 (H) 01/12/2024 0952   TRIG 23 01/12/2024 0952   HDL 112 01/12/2024 0952   CHOLHDL 2.1 01/12/2024 0952   VLDL 5 01/12/2024 0952   LDLCALC 113 (H) 01/12/2024 0952      Lab Results  Component Value Date   TSH 4.099 07/09/2024   TSH 2.941 01/12/2024   TSH 34.602 (H) 07/13/2023   TSH 31.308 (H) 04/05/2023   TSH 0.594 02/24/2023   TSH <0.010 (L) 01/18/2023   TSH 0.21 (A) 08/18/2022   TSH 0.597 02/24/2022   TSH 0.699 08/26/2021   TSH 0.593 02/23/2021   FREET4 0.93 07/09/2024   FREET4 1.37 (H) 01/12/2024   FREET4 0.83 07/13/2023   FREET4 0.52 (L) 04/05/2023   FREET4 0.87 02/24/2023   FREET4 1.84 (H) 01/18/2023   FREET4 0.88 02/24/2022   FREET4 0.78 08/26/2021   FREET4 0.86 02/23/2021   FREET4 0.75 08/28/2020      Assessment & Plan:   1.  RAI induced hypothyroidism 2. toxic adenoma-status post  RAI-resolved.   3.  Prediabetes  I discussed her previsit labs with her.  Her labs are consistent with appropriate replacement.  She is advised to continue levothyroxine  62.5 mcg p.o. daily before breakfast.     - We discussed about the correct intake of her thyroid  hormone, on empty stomach at fasting, with water , separated by at least 30 minutes from breakfast and other medications,  and separated by more than 4 hours from calcium, iron, multivitamins, acid reflux medications (PPIs). -Patient is made aware of the fact that thyroid  hormone replacement is needed for life, dose to be adjusted by periodic monitoring of thyroid  function tests.  -She has a negative fine-needle aspiration of a thyroid  nodule.   -She quit smoking in 2023.  Regarding her prediabetes, she will not need intervention at this time.  I discussed dietary modification with her to avoid processed carbs and processed meats. Her meter was prescribed for her to monitor blood glucose twice daily-daily before breakfast and at bedtime. She was recently diagnosed  with intra-abdominal neuroendocrine tumor s/p surgical resection, currently on electrolyte hide LAR 30 mg intramuscular injection every month.  She is advised to maintain close follow-up with her oncology team.    - I advised her  to maintain close follow up with Trudy Vaughn FALCON, MD for primary care needs.   I spent  22  minutes in the care of the patient today including review of labs from Thyroid  Function, CMP, and other relevant labs ; imaging/biopsy records (current and previous including abstractions from other facilities); face-to-face time discussing  her lab results and symptoms, medications doses, her options of short and long term treatment based on the latest standards of care / guidelines;   and documenting the encounter.  Nicole Ewing  participated in the discussions, expressed understanding, and voiced agreement with the above plans.  All questions were  answered to her satisfaction. she is encouraged to contact clinic should she have any questions or concerns prior to her return visit.   Follow up plan: Return in about 6 months (around 01/18/2025) for Fasting Labs  in AM B4 8, A1c -NV.   Ranny Earl, MD [facility] [address], [place] [address] Phone: [phone]  Fax: [phone]     07/18/2024, 4:54 PM  This note was partially dictated with voice recognition software. Similar sounding words can be transcribed inadequately or may not  be corrected upon review.

## 2024-07-19 ENCOUNTER — Other Ambulatory Visit: Payer: Self-pay

## 2024-07-19 DIAGNOSIS — R7303 Prediabetes: Secondary | ICD-10-CM

## 2024-07-19 MED ORDER — ACCU-CHEK SOFTCLIX LANCETS MISC
1.0000 | Freq: Two times a day (BID) | 2 refills | Status: AC
Start: 2024-07-19 — End: ?

## 2024-07-24 ENCOUNTER — Other Ambulatory Visit: Payer: Self-pay | Admitting: Gastroenterology

## 2024-07-24 DIAGNOSIS — K219 Gastro-esophageal reflux disease without esophagitis: Secondary | ICD-10-CM

## 2024-07-27 DIAGNOSIS — G894 Chronic pain syndrome: Secondary | ICD-10-CM | POA: Diagnosis not present

## 2024-07-27 DIAGNOSIS — M25561 Pain in right knee: Secondary | ICD-10-CM | POA: Diagnosis not present

## 2024-07-27 DIAGNOSIS — M25562 Pain in left knee: Secondary | ICD-10-CM | POA: Diagnosis not present

## 2024-07-27 DIAGNOSIS — M47816 Spondylosis without myelopathy or radiculopathy, lumbar region: Secondary | ICD-10-CM | POA: Diagnosis not present

## 2024-07-27 DIAGNOSIS — Z79891 Long term (current) use of opiate analgesic: Secondary | ICD-10-CM | POA: Diagnosis not present

## 2024-07-27 DIAGNOSIS — Z5181 Encounter for therapeutic drug level monitoring: Secondary | ICD-10-CM | POA: Diagnosis not present

## 2024-07-31 DIAGNOSIS — C44712 Basal cell carcinoma of skin of right lower limb, including hip: Secondary | ICD-10-CM | POA: Diagnosis not present

## 2024-07-31 DIAGNOSIS — D485 Neoplasm of uncertain behavior of skin: Secondary | ICD-10-CM | POA: Diagnosis not present

## 2024-07-31 DIAGNOSIS — L821 Other seborrheic keratosis: Secondary | ICD-10-CM | POA: Diagnosis not present

## 2024-07-31 DIAGNOSIS — L708 Other acne: Secondary | ICD-10-CM | POA: Diagnosis not present

## 2024-07-31 DIAGNOSIS — D1801 Hemangioma of skin and subcutaneous tissue: Secondary | ICD-10-CM | POA: Diagnosis not present

## 2024-07-31 DIAGNOSIS — L538 Other specified erythematous conditions: Secondary | ICD-10-CM | POA: Diagnosis not present

## 2024-07-31 DIAGNOSIS — Z85828 Personal history of other malignant neoplasm of skin: Secondary | ICD-10-CM | POA: Diagnosis not present

## 2024-07-31 DIAGNOSIS — R208 Other disturbances of skin sensation: Secondary | ICD-10-CM | POA: Diagnosis not present

## 2024-07-31 DIAGNOSIS — L570 Actinic keratosis: Secondary | ICD-10-CM | POA: Diagnosis not present

## 2024-07-31 DIAGNOSIS — L578 Other skin changes due to chronic exposure to nonionizing radiation: Secondary | ICD-10-CM | POA: Diagnosis not present

## 2024-07-31 DIAGNOSIS — Z08 Encounter for follow-up examination after completed treatment for malignant neoplasm: Secondary | ICD-10-CM | POA: Diagnosis not present

## 2024-07-31 DIAGNOSIS — L820 Inflamed seborrheic keratosis: Secondary | ICD-10-CM | POA: Diagnosis not present

## 2024-07-31 DIAGNOSIS — L814 Other melanin hyperpigmentation: Secondary | ICD-10-CM | POA: Diagnosis not present

## 2024-08-07 DIAGNOSIS — C260 Malignant neoplasm of intestinal tract, part unspecified: Secondary | ICD-10-CM | POA: Diagnosis not present

## 2024-08-07 DIAGNOSIS — C7A012 Malignant carcinoid tumor of the ileum: Secondary | ICD-10-CM | POA: Diagnosis not present

## 2024-08-07 DIAGNOSIS — D3A8 Other benign neuroendocrine tumors: Secondary | ICD-10-CM | POA: Diagnosis not present

## 2024-08-07 DIAGNOSIS — E3400 Carcinoid syndrome, unspecified: Secondary | ICD-10-CM | POA: Diagnosis not present

## 2024-08-10 DIAGNOSIS — I10 Essential (primary) hypertension: Secondary | ICD-10-CM | POA: Diagnosis not present

## 2024-08-10 DIAGNOSIS — Z0001 Encounter for general adult medical examination with abnormal findings: Secondary | ICD-10-CM | POA: Diagnosis not present

## 2024-08-10 DIAGNOSIS — R7989 Other specified abnormal findings of blood chemistry: Secondary | ICD-10-CM | POA: Diagnosis not present

## 2024-08-10 DIAGNOSIS — K760 Fatty (change of) liver, not elsewhere classified: Secondary | ICD-10-CM | POA: Diagnosis not present

## 2024-08-10 DIAGNOSIS — E039 Hypothyroidism, unspecified: Secondary | ICD-10-CM | POA: Diagnosis not present

## 2024-08-10 DIAGNOSIS — R739 Hyperglycemia, unspecified: Secondary | ICD-10-CM | POA: Diagnosis not present

## 2024-08-15 DIAGNOSIS — K760 Fatty (change of) liver, not elsewhere classified: Secondary | ICD-10-CM | POA: Diagnosis not present

## 2024-08-15 DIAGNOSIS — Z681 Body mass index (BMI) 19 or less, adult: Secondary | ICD-10-CM | POA: Diagnosis not present

## 2024-08-15 DIAGNOSIS — E0590 Thyrotoxicosis, unspecified without thyrotoxic crisis or storm: Secondary | ICD-10-CM | POA: Diagnosis not present

## 2024-08-15 DIAGNOSIS — I10 Essential (primary) hypertension: Secondary | ICD-10-CM | POA: Diagnosis not present

## 2024-08-20 DIAGNOSIS — D3A8 Other benign neuroendocrine tumors: Secondary | ICD-10-CM | POA: Diagnosis not present

## 2024-08-20 DIAGNOSIS — C44500 Unspecified malignant neoplasm of anal skin: Secondary | ICD-10-CM | POA: Diagnosis not present

## 2024-09-04 DIAGNOSIS — C260 Malignant neoplasm of intestinal tract, part unspecified: Secondary | ICD-10-CM | POA: Diagnosis not present

## 2024-09-04 DIAGNOSIS — D3A8 Other benign neuroendocrine tumors: Secondary | ICD-10-CM | POA: Diagnosis not present

## 2024-09-04 DIAGNOSIS — Z96642 Presence of left artificial hip joint: Secondary | ICD-10-CM | POA: Diagnosis not present

## 2024-09-04 DIAGNOSIS — C7A012 Malignant carcinoid tumor of the ileum: Secondary | ICD-10-CM | POA: Diagnosis not present

## 2024-09-12 DIAGNOSIS — D045 Carcinoma in situ of skin of trunk: Secondary | ICD-10-CM | POA: Diagnosis not present

## 2024-09-21 DIAGNOSIS — J019 Acute sinusitis, unspecified: Secondary | ICD-10-CM | POA: Diagnosis not present

## 2024-09-21 DIAGNOSIS — Z681 Body mass index (BMI) 19 or less, adult: Secondary | ICD-10-CM | POA: Diagnosis not present

## 2024-09-21 DIAGNOSIS — J0100 Acute maxillary sinusitis, unspecified: Secondary | ICD-10-CM | POA: Diagnosis not present

## 2024-09-25 DIAGNOSIS — D3A8 Other benign neuroendocrine tumors: Secondary | ICD-10-CM | POA: Diagnosis not present

## 2024-09-25 DIAGNOSIS — Z79899 Other long term (current) drug therapy: Secondary | ICD-10-CM | POA: Diagnosis not present

## 2024-10-01 DIAGNOSIS — D099 Carcinoma in situ, unspecified: Secondary | ICD-10-CM | POA: Diagnosis not present

## 2024-10-01 DIAGNOSIS — E3400 Carcinoid syndrome, unspecified: Secondary | ICD-10-CM | POA: Diagnosis not present

## 2024-10-01 DIAGNOSIS — C7A1 Malignant poorly differentiated neuroendocrine tumors: Secondary | ICD-10-CM | POA: Diagnosis not present

## 2024-10-01 DIAGNOSIS — C7A012 Malignant carcinoid tumor of the ileum: Secondary | ICD-10-CM | POA: Diagnosis not present

## 2024-10-01 DIAGNOSIS — D500 Iron deficiency anemia secondary to blood loss (chronic): Secondary | ICD-10-CM | POA: Diagnosis not present

## 2024-10-01 DIAGNOSIS — Z08 Encounter for follow-up examination after completed treatment for malignant neoplasm: Secondary | ICD-10-CM | POA: Diagnosis not present

## 2024-10-01 DIAGNOSIS — C260 Malignant neoplasm of intestinal tract, part unspecified: Secondary | ICD-10-CM | POA: Diagnosis not present

## 2024-10-01 DIAGNOSIS — E43 Unspecified severe protein-calorie malnutrition: Secondary | ICD-10-CM | POA: Diagnosis not present

## 2024-10-01 DIAGNOSIS — D3A8 Other benign neuroendocrine tumors: Secondary | ICD-10-CM | POA: Diagnosis not present

## 2024-10-13 ENCOUNTER — Other Ambulatory Visit: Payer: Self-pay | Admitting: "Endocrinology

## 2024-10-15 ENCOUNTER — Other Ambulatory Visit (HOSPITAL_COMMUNITY): Payer: Self-pay | Admitting: Nurse Practitioner

## 2024-10-15 ENCOUNTER — Encounter (HOSPITAL_COMMUNITY): Payer: Self-pay | Admitting: Nurse Practitioner

## 2024-10-15 DIAGNOSIS — D3A8 Other benign neuroendocrine tumors: Secondary | ICD-10-CM

## 2024-10-15 DIAGNOSIS — C7A1 Malignant poorly differentiated neuroendocrine tumors: Secondary | ICD-10-CM

## 2024-10-29 ENCOUNTER — Encounter (HOSPITAL_COMMUNITY)
Admission: RE | Admit: 2024-10-29 | Discharge: 2024-10-29 | Disposition: A | Discharge status: Home / Self Care | Source: Ambulatory Visit | Attending: Nurse Practitioner | Admitting: Nurse Practitioner

## 2024-10-29 DIAGNOSIS — D3A8 Other benign neuroendocrine tumors: Secondary | ICD-10-CM | POA: Diagnosis present

## 2024-10-29 DIAGNOSIS — C7A1 Malignant poorly differentiated neuroendocrine tumors: Secondary | ICD-10-CM | POA: Insufficient documentation

## 2024-10-29 MED ORDER — COPPER CU 64 DOTATATE 1 MCI/ML IV SOLN
4.0000 | Freq: Once | INTRAVENOUS | Status: AC
  Administered 2024-10-29: 4.32 via INTRAVENOUS

## 2025-01-08 ENCOUNTER — Other Ambulatory Visit: Payer: Self-pay | Admitting: Gastroenterology

## 2025-01-08 ENCOUNTER — Telehealth: Payer: Self-pay | Admitting: *Deleted

## 2025-01-08 DIAGNOSIS — K219 Gastro-esophageal reflux disease without esophagitis: Secondary | ICD-10-CM

## 2025-01-08 MED ORDER — PANTOPRAZOLE SODIUM 40 MG PO TBEC: 40.0000 mg | DELAYED_RELEASE_TABLET | Freq: Two times a day (BID) | ORAL | 1 refills | Status: AC

## 2025-01-08 NOTE — Telephone Encounter (Signed)
 Received refill request for pantoprazole  40mg . Pt last OV 09/15/2023

## 2025-01-08 NOTE — Telephone Encounter (Signed)
 Noted.

## 2025-01-21 ENCOUNTER — Ambulatory Visit: Admitting: "Endocrinology

## 2025-02-06 ENCOUNTER — Ambulatory Visit: Admitting: Gastroenterology
